# Patient Record
Sex: Female | Born: 1935
Health system: Southern US, Community
[De-identification: ages and names within clinical notes are randomized; demographics above are authoritative.]

## PROBLEM LIST (undated history)

## (undated) DIAGNOSIS — E785 Hyperlipidemia, unspecified: Secondary | ICD-10-CM

## (undated) DIAGNOSIS — K219 Gastro-esophageal reflux disease without esophagitis: Secondary | ICD-10-CM

## (undated) DIAGNOSIS — K2970 Gastritis, unspecified, without bleeding: Secondary | ICD-10-CM

## (undated) DIAGNOSIS — R7303 Prediabetes: Secondary | ICD-10-CM

## (undated) DIAGNOSIS — L570 Actinic keratosis: Secondary | ICD-10-CM

## (undated) DIAGNOSIS — M8580 Other specified disorders of bone density and structure, unspecified site: Secondary | ICD-10-CM

## (undated) DIAGNOSIS — K2210 Ulcer of esophagus without bleeding: Secondary | ICD-10-CM

## (undated) DIAGNOSIS — I10 Essential (primary) hypertension: Secondary | ICD-10-CM

## (undated) DIAGNOSIS — M1990 Unspecified osteoarthritis, unspecified site: Secondary | ICD-10-CM

## (undated) HISTORY — DX: Gastritis, unspecified, without bleeding: K29.70

## (undated) HISTORY — DX: Prediabetes: R73.03

## (undated) HISTORY — PX: OTHER SURGICAL HISTORY: SHX169

## (undated) HISTORY — DX: Actinic keratosis: L57.0

## (undated) HISTORY — DX: Hyperlipidemia, unspecified: E78.5

## (undated) HISTORY — DX: Unspecified osteoarthritis, unspecified site: M19.90

## (undated) HISTORY — DX: Essential (primary) hypertension: I10

## (undated) HISTORY — PX: APPENDECTOMY: SHX54

## (undated) HISTORY — DX: Gastro-esophageal reflux disease without esophagitis: K21.9

## (undated) HISTORY — DX: Ulcer of esophagus without bleeding: K22.10

## (undated) HISTORY — PX: EYE SURGERY: SHX253

## (undated) HISTORY — DX: Other specified disorders of bone density and structure, unspecified site: M85.80

---

## 2003-10-21 HISTORY — PX: OTHER SURGICAL HISTORY: SHX169

## 2004-06-27 ENCOUNTER — Emergency Department (HOSPITAL_COMMUNITY): Admission: EM | Admit: 2004-06-27 | Discharge: 2004-06-27 | Payer: Self-pay | Admitting: Family Medicine

## 2005-02-13 ENCOUNTER — Ambulatory Visit (HOSPITAL_COMMUNITY): Admission: RE | Admit: 2005-02-13 | Discharge: 2005-02-13 | Payer: Self-pay | Admitting: Family Medicine

## 2005-02-13 ENCOUNTER — Emergency Department (HOSPITAL_COMMUNITY): Admission: EM | Admit: 2005-02-13 | Discharge: 2005-02-13 | Payer: Self-pay | Admitting: Family Medicine

## 2005-09-30 ENCOUNTER — Ambulatory Visit: Payer: Self-pay | Admitting: Physical Medicine & Rehabilitation

## 2005-09-30 ENCOUNTER — Inpatient Hospital Stay (HOSPITAL_COMMUNITY): Admission: RE | Admit: 2005-09-30 | Discharge: 2005-10-03 | Payer: Self-pay | Admitting: Orthopedic Surgery

## 2005-09-30 HISTORY — PX: OTHER SURGICAL HISTORY: SHX169

## 2005-10-03 ENCOUNTER — Inpatient Hospital Stay
Admission: RE | Admit: 2005-10-03 | Discharge: 2005-10-10 | Payer: Self-pay | Admitting: Physical Medicine & Rehabilitation

## 2007-05-07 ENCOUNTER — Encounter: Admission: RE | Admit: 2007-05-07 | Discharge: 2007-05-07 | Payer: Self-pay | Admitting: General Practice

## 2007-07-21 HISTORY — PX: OTHER SURGICAL HISTORY: SHX169

## 2007-07-30 ENCOUNTER — Ambulatory Visit: Payer: Self-pay | Admitting: Family Medicine

## 2007-07-30 DIAGNOSIS — I10 Essential (primary) hypertension: Secondary | ICD-10-CM | POA: Insufficient documentation

## 2007-08-05 ENCOUNTER — Ambulatory Visit: Payer: Self-pay

## 2007-08-06 ENCOUNTER — Ambulatory Visit: Payer: Self-pay | Admitting: Family Medicine

## 2007-08-09 ENCOUNTER — Ambulatory Visit: Payer: Self-pay | Admitting: Family Medicine

## 2007-08-11 DIAGNOSIS — E785 Hyperlipidemia, unspecified: Secondary | ICD-10-CM | POA: Insufficient documentation

## 2007-08-11 LAB — CONVERTED CEMR LAB
ALT: 13 units/L (ref 0–35)
AST: 17 units/L (ref 0–37)
Albumin: 3.8 g/dL (ref 3.5–5.2)
Alkaline Phosphatase: 44 units/L (ref 39–117)
BUN: 23 mg/dL (ref 6–23)
Bilirubin, Direct: 0.1 mg/dL (ref 0.0–0.3)
CO2: 25 meq/L (ref 19–32)
Calcium: 9.5 mg/dL (ref 8.4–10.5)
Chloride: 106 meq/L (ref 96–112)
Cholesterol: 225 mg/dL (ref 0–200)
Creatinine, Ser: 1 mg/dL (ref 0.4–1.2)
Direct LDL: 150 mg/dL
GFR calc Af Amer: 69 mL/min
GFR calc non Af Amer: 57 mL/min
Glucose, Bld: 115 mg/dL — ABNORMAL HIGH (ref 70–99)
HDL: 39.1 mg/dL (ref >39.0)
Potassium: 4.5 meq/L (ref 3.5–5.1)
Sodium: 141 meq/L (ref 135–145)
TSH: 2.09 microintl units/mL (ref 0.35–5.50)
Total Bilirubin: 0.5 mg/dL (ref 0.3–1.2)
Total CHOL/HDL Ratio: 5.8
Total Protein: 7.1 g/dL (ref 6.0–8.3)
Triglycerides: 143 mg/dL (ref 0–149)
VLDL: 29 mg/dL (ref 0–40)

## 2007-08-12 ENCOUNTER — Encounter: Payer: Self-pay | Admitting: Family Medicine

## 2007-08-20 ENCOUNTER — Ambulatory Visit: Payer: Self-pay | Admitting: Family Medicine

## 2007-08-20 DIAGNOSIS — K219 Gastro-esophageal reflux disease without esophagitis: Secondary | ICD-10-CM | POA: Insufficient documentation

## 2007-08-20 DIAGNOSIS — M1990 Unspecified osteoarthritis, unspecified site: Secondary | ICD-10-CM | POA: Insufficient documentation

## 2007-09-03 ENCOUNTER — Ambulatory Visit: Payer: Self-pay | Admitting: Family Medicine

## 2007-09-07 ENCOUNTER — Ambulatory Visit: Payer: Self-pay | Admitting: Family Medicine

## 2007-09-08 ENCOUNTER — Encounter (INDEPENDENT_AMBULATORY_CARE_PROVIDER_SITE_OTHER): Payer: Self-pay | Admitting: *Deleted

## 2007-10-06 ENCOUNTER — Ambulatory Visit: Payer: Self-pay | Admitting: Family Medicine

## 2007-11-03 ENCOUNTER — Ambulatory Visit: Payer: Self-pay | Admitting: Internal Medicine

## 2007-11-06 ENCOUNTER — Encounter: Payer: Self-pay | Admitting: Internal Medicine

## 2007-11-10 ENCOUNTER — Ambulatory Visit: Payer: Self-pay | Admitting: Internal Medicine

## 2007-11-10 ENCOUNTER — Encounter: Payer: Self-pay | Admitting: Family Medicine

## 2007-11-10 ENCOUNTER — Encounter: Payer: Self-pay | Admitting: Internal Medicine

## 2007-11-12 ENCOUNTER — Ambulatory Visit: Payer: Self-pay | Admitting: Family Medicine

## 2007-11-12 DIAGNOSIS — K208 Other esophagitis without bleeding: Secondary | ICD-10-CM | POA: Insufficient documentation

## 2007-11-12 DIAGNOSIS — K2210 Ulcer of esophagus without bleeding: Secondary | ICD-10-CM | POA: Insufficient documentation

## 2007-11-12 DIAGNOSIS — K279 Peptic ulcer, site unspecified, unspecified as acute or chronic, without hemorrhage or perforation: Secondary | ICD-10-CM | POA: Insufficient documentation

## 2007-11-15 LAB — CONVERTED CEMR LAB
BUN: 20 mg/dL (ref 6–23)
CO2: 28 meq/L (ref 19–32)
Calcium: 9.7 mg/dL (ref 8.4–10.5)
Chloride: 107 meq/L (ref 96–112)
Cholesterol: 205 mg/dL (ref 0–200)
Creatinine, Ser: 1 mg/dL (ref 0.4–1.2)
Direct LDL: 125.4 mg/dL
GFR calc Af Amer: 69 mL/min
GFR calc non Af Amer: 57 mL/min
Glucose, Bld: 113 mg/dL — ABNORMAL HIGH (ref 70–99)
HDL: 37.1 mg/dL — ABNORMAL LOW (ref >39.0)
Potassium: 3.8 meq/L (ref 3.5–5.1)
Sodium: 142 meq/L (ref 135–145)
Total CHOL/HDL Ratio: 5.5
Triglycerides: 163 mg/dL — ABNORMAL HIGH (ref 0–149)
VLDL: 33 mg/dL (ref 0–40)

## 2007-12-28 ENCOUNTER — Telehealth (INDEPENDENT_AMBULATORY_CARE_PROVIDER_SITE_OTHER): Payer: Self-pay | Admitting: *Deleted

## 2008-01-05 ENCOUNTER — Ambulatory Visit: Payer: Self-pay | Admitting: Internal Medicine

## 2008-01-05 ENCOUNTER — Encounter: Payer: Self-pay | Admitting: Family Medicine

## 2008-03-24 ENCOUNTER — Ambulatory Visit: Payer: Self-pay | Admitting: Family Medicine

## 2008-03-27 ENCOUNTER — Encounter: Payer: Self-pay | Admitting: Family Medicine

## 2008-03-27 ENCOUNTER — Ambulatory Visit: Payer: Self-pay | Admitting: Family Medicine

## 2008-03-27 LAB — CONVERTED CEMR LAB
Basophils Absolute: 0 10*3/uL (ref 0.0–0.1)
Basophils Relative: 0.6 % (ref 0.0–1.0)
Eosinophils Absolute: 0.2 10*3/uL (ref 0.0–0.7)
Eosinophils Relative: 2.7 % (ref 0.0–5.0)
Folate: >20 ng/mL
HCT: 42 % (ref 36.0–46.0)
Hemoglobin: 14.6 g/dL (ref 12.0–15.0)
Lymphocytes Relative: 29.8 % (ref 12.0–46.0)
MCHC: 34.8 g/dL (ref 30.0–36.0)
MCV: 92.9 fL (ref 78.0–100.0)
Monocytes Absolute: 0.8 10*3/uL (ref 0.1–1.0)
Monocytes Relative: 9.1 % (ref 3.0–12.0)
Neutro Abs: 4.8 10*3/uL (ref 1.4–7.7)
Neutrophils Relative %: 57.8 % (ref 43.0–77.0)
Platelets: 227 10*3/uL (ref 150–400)
RBC: 4.52 M/uL (ref 3.87–5.11)
RDW: 12.7 % (ref 11.5–14.6)
Sed Rate: 26 mm/hr — ABNORMAL HIGH (ref 0–22)
TSH: 4.59 microintl units/mL (ref 0.35–5.50)
Vitamin B-12: 425 pg/mL (ref 211–911)
WBC: 8.3 10*3/uL (ref 4.5–10.5)

## 2008-05-10 ENCOUNTER — Ambulatory Visit: Payer: Self-pay | Admitting: Family Medicine

## 2008-05-25 ENCOUNTER — Encounter: Admission: RE | Admit: 2008-05-25 | Discharge: 2008-05-25 | Payer: Self-pay | Admitting: Family Medicine

## 2008-05-26 ENCOUNTER — Telehealth: Payer: Self-pay | Admitting: Family Medicine

## 2008-05-29 ENCOUNTER — Encounter (INDEPENDENT_AMBULATORY_CARE_PROVIDER_SITE_OTHER): Payer: Self-pay | Admitting: *Deleted

## 2008-08-09 ENCOUNTER — Ambulatory Visit: Payer: Self-pay | Admitting: Family Medicine

## 2008-09-08 ENCOUNTER — Telehealth: Payer: Self-pay | Admitting: Family Medicine

## 2008-09-11 ENCOUNTER — Telehealth: Payer: Self-pay | Admitting: Family Medicine

## 2008-11-01 ENCOUNTER — Ambulatory Visit: Payer: Self-pay | Admitting: Family Medicine

## 2008-11-01 LAB — CONVERTED CEMR LAB
ALT: 15 units/L (ref 0–35)
AST: 17 units/L (ref 0–37)
Albumin: 3.8 g/dL (ref 3.5–5.2)
Alkaline Phosphatase: 42 units/L (ref 39–117)
BUN: 20 mg/dL (ref 6–23)
Bilirubin, Direct: 0.1 mg/dL (ref 0.0–0.3)
CO2: 28 meq/L (ref 19–32)
Calcium: 9.9 mg/dL (ref 8.4–10.5)
Chloride: 104 meq/L (ref 96–112)
Cholesterol: 218 mg/dL (ref 0–200)
Creatinine, Ser: 1 mg/dL (ref 0.4–1.2)
Direct LDL: 132.9 mg/dL
GFR calc Af Amer: 68 mL/min
GFR calc non Af Amer: 57 mL/min
Glucose, Bld: 115 mg/dL — ABNORMAL HIGH (ref 70–99)
HDL: 36.1 mg/dL — ABNORMAL LOW (ref >39.0)
Potassium: 4.3 meq/L (ref 3.5–5.1)
Sodium: 141 meq/L (ref 135–145)
Total Bilirubin: 0.6 mg/dL (ref 0.3–1.2)
Total CHOL/HDL Ratio: 6
Total Protein: 7.1 g/dL (ref 6.0–8.3)
Triglycerides: 164 mg/dL — ABNORMAL HIGH (ref 0–149)
VLDL: 33 mg/dL (ref 0–40)

## 2008-11-06 ENCOUNTER — Ambulatory Visit: Payer: Self-pay | Admitting: Family Medicine

## 2008-11-06 DIAGNOSIS — R7303 Prediabetes: Secondary | ICD-10-CM | POA: Insufficient documentation

## 2008-11-06 LAB — CONVERTED CEMR LAB
Cholesterol, target level: 200 mg/dL
HDL goal, serum: 40 mg/dL
LDL Goal: 130 mg/dL

## 2008-11-10 ENCOUNTER — Encounter: Payer: Self-pay | Admitting: Family Medicine

## 2008-11-10 ENCOUNTER — Ambulatory Visit: Payer: Self-pay | Admitting: Family Medicine

## 2008-11-23 ENCOUNTER — Ambulatory Visit: Payer: Self-pay | Admitting: Family Medicine

## 2008-11-24 LAB — CONVERTED CEMR LAB
BUN: 34 mg/dL — ABNORMAL HIGH (ref 6–23)
CO2: 0 meq/L — CL (ref 19–32)
Calcium: 9.8 mg/dL (ref 8.4–10.5)
Chloride: 109 meq/L (ref 96–112)
Creatinine, Ser: 1 mg/dL (ref 0.4–1.2)
GFR calc Af Amer: 68 mL/min
GFR calc non Af Amer: 57 mL/min
Glucose, Bld: 104 mg/dL — ABNORMAL HIGH (ref 70–99)
Potassium: 5 meq/L (ref 3.5–5.1)
Sodium: 139 meq/L (ref 135–145)

## 2008-12-11 ENCOUNTER — Ambulatory Visit: Payer: Self-pay | Admitting: Family Medicine

## 2008-12-27 ENCOUNTER — Encounter: Payer: Self-pay | Admitting: Family Medicine

## 2008-12-29 ENCOUNTER — Telehealth: Payer: Self-pay | Admitting: Family Medicine

## 2009-01-17 ENCOUNTER — Telehealth: Payer: Self-pay | Admitting: Family Medicine

## 2009-01-17 ENCOUNTER — Ambulatory Visit: Payer: Self-pay | Admitting: Family Medicine

## 2009-01-17 LAB — CONVERTED CEMR LAB
BUN: 21 mg/dL (ref 6–23)
CO2: 29 meq/L (ref 19–32)
Calcium: 9.6 mg/dL (ref 8.4–10.5)
Chloride: 105 meq/L (ref 96–112)
Creatinine, Ser: 0.8 mg/dL (ref 0.4–1.2)
GFR calc non Af Amer: 73.11 mL/min (ref >60)
Glucose, Bld: 112 mg/dL — ABNORMAL HIGH (ref 70–99)
Potassium: 4.1 meq/L (ref 3.5–5.1)
Sodium: 140 meq/L (ref 135–145)

## 2009-01-25 ENCOUNTER — Ambulatory Visit (HOSPITAL_COMMUNITY): Admission: RE | Admit: 2009-01-25 | Discharge: 2009-01-25 | Payer: Self-pay | Admitting: Orthopedic Surgery

## 2009-03-12 ENCOUNTER — Ambulatory Visit: Payer: Self-pay | Admitting: Family Medicine

## 2009-04-24 ENCOUNTER — Telehealth: Payer: Self-pay | Admitting: Family Medicine

## 2009-05-08 ENCOUNTER — Telehealth: Payer: Self-pay | Admitting: Family Medicine

## 2009-05-20 HISTORY — PX: KNEE SURGERY: SHX244

## 2009-05-21 ENCOUNTER — Ambulatory Visit (HOSPITAL_COMMUNITY): Admission: RE | Admit: 2009-05-21 | Discharge: 2009-05-22 | Payer: Self-pay | Admitting: Orthopedic Surgery

## 2009-05-21 ENCOUNTER — Encounter (INDEPENDENT_AMBULATORY_CARE_PROVIDER_SITE_OTHER): Payer: Self-pay | Admitting: Orthopedic Surgery

## 2009-06-12 ENCOUNTER — Ambulatory Visit: Payer: Self-pay | Admitting: Family Medicine

## 2009-06-14 LAB — CONVERTED CEMR LAB
ALT: 13 units/L (ref 0–35)
AST: 15 units/L (ref 0–37)
Albumin: 3.7 g/dL (ref 3.5–5.2)
Alkaline Phosphatase: 38 units/L — ABNORMAL LOW (ref 39–117)
BUN: 26 mg/dL — ABNORMAL HIGH (ref 6–23)
Bilirubin, Direct: 0 mg/dL (ref 0.0–0.3)
CO2: 26 meq/L (ref 19–32)
Calcium: 9.2 mg/dL (ref 8.4–10.5)
Chloride: 113 meq/L — ABNORMAL HIGH (ref 96–112)
Cholesterol: 190 mg/dL (ref 0–200)
Creatinine, Ser: 1 mg/dL (ref 0.4–1.2)
GFR calc non Af Amer: 56.46 mL/min (ref >60)
Glucose, Bld: 103 mg/dL — ABNORMAL HIGH (ref 70–99)
HDL: 33.6 mg/dL — ABNORMAL LOW (ref >39.00)
LDL Cholesterol: 128 mg/dL — ABNORMAL HIGH (ref 0–99)
Potassium: 4.8 meq/L (ref 3.5–5.1)
Sodium: 143 meq/L (ref 135–145)
Total Bilirubin: 0.8 mg/dL (ref 0.3–1.2)
Total CHOL/HDL Ratio: 6
Total Protein: 6.6 g/dL (ref 6.0–8.3)
Triglycerides: 143 mg/dL (ref 0.0–149.0)
VLDL: 28.6 mg/dL (ref 0.0–40.0)

## 2009-06-15 ENCOUNTER — Ambulatory Visit: Payer: Self-pay | Admitting: Family Medicine

## 2009-06-19 ENCOUNTER — Encounter: Admission: RE | Admit: 2009-06-19 | Discharge: 2009-06-19 | Payer: Self-pay | Admitting: Family Medicine

## 2009-06-20 ENCOUNTER — Ambulatory Visit: Payer: Self-pay | Admitting: Internal Medicine

## 2009-06-21 DIAGNOSIS — R930 Abnormal findings on diagnostic imaging of skull and head, not elsewhere classified: Secondary | ICD-10-CM | POA: Insufficient documentation

## 2009-06-26 ENCOUNTER — Encounter: Admission: RE | Admit: 2009-06-26 | Discharge: 2009-06-26 | Payer: Self-pay | Admitting: Family Medicine

## 2009-06-28 DIAGNOSIS — M8580 Other specified disorders of bone density and structure, unspecified site: Secondary | ICD-10-CM | POA: Insufficient documentation

## 2009-09-18 ENCOUNTER — Ambulatory Visit: Payer: Self-pay | Admitting: Family Medicine

## 2009-10-17 ENCOUNTER — Encounter: Payer: Self-pay | Admitting: Family Medicine

## 2009-11-14 ENCOUNTER — Ambulatory Visit: Payer: Self-pay | Admitting: Family Medicine

## 2009-11-21 LAB — CONVERTED CEMR LAB
ALT: 14 units/L (ref 0–35)
AST: 17 units/L (ref 0–37)
Albumin: 3.6 g/dL (ref 3.5–5.2)
Alkaline Phosphatase: 42 units/L (ref 39–117)
BUN: 21 mg/dL (ref 6–23)
Bilirubin, Direct: 0 mg/dL (ref 0.0–0.3)
CO2: 28 meq/L (ref 19–32)
Calcium: 9.7 mg/dL (ref 8.4–10.5)
Chloride: 106 meq/L (ref 96–112)
Cholesterol: 200 mg/dL (ref 0–200)
Creatinine, Ser: 1 mg/dL (ref 0.4–1.2)
Direct LDL: 123.8 mg/dL
GFR calc non Af Amer: 56.4 mL/min (ref >60)
Glucose, Bld: 105 mg/dL — ABNORMAL HIGH (ref 70–99)
HDL: 39.2 mg/dL (ref >39.00)
Potassium: 4.2 meq/L (ref 3.5–5.1)
Sodium: 141 meq/L (ref 135–145)
Total Bilirubin: 0.6 mg/dL (ref 0.3–1.2)
Total CHOL/HDL Ratio: 5
Total Protein: 6.7 g/dL (ref 6.0–8.3)
Triglycerides: 213 mg/dL — ABNORMAL HIGH (ref 0.0–149.0)
VLDL: 42.6 mg/dL — ABNORMAL HIGH (ref 0.0–40.0)

## 2009-11-23 ENCOUNTER — Ambulatory Visit: Payer: Self-pay | Admitting: Family Medicine

## 2009-12-04 ENCOUNTER — Encounter: Payer: Self-pay | Admitting: Family Medicine

## 2009-12-12 ENCOUNTER — Encounter (INDEPENDENT_AMBULATORY_CARE_PROVIDER_SITE_OTHER): Payer: Self-pay | Admitting: *Deleted

## 2009-12-12 ENCOUNTER — Ambulatory Visit: Payer: Self-pay | Admitting: Internal Medicine

## 2009-12-18 ENCOUNTER — Ambulatory Visit: Payer: Self-pay | Admitting: Cardiovascular Disease

## 2009-12-26 ENCOUNTER — Ambulatory Visit: Payer: Self-pay | Admitting: Internal Medicine

## 2010-01-02 ENCOUNTER — Telehealth (INDEPENDENT_AMBULATORY_CARE_PROVIDER_SITE_OTHER): Payer: Self-pay | Admitting: *Deleted

## 2010-02-05 ENCOUNTER — Ambulatory Visit: Payer: Self-pay | Admitting: Internal Medicine

## 2010-02-05 DIAGNOSIS — K2970 Gastritis, unspecified, without bleeding: Secondary | ICD-10-CM | POA: Insufficient documentation

## 2010-02-05 DIAGNOSIS — K2990 Gastroduodenitis, unspecified, without bleeding: Secondary | ICD-10-CM

## 2010-03-04 ENCOUNTER — Ambulatory Visit (HOSPITAL_COMMUNITY): Admission: RE | Admit: 2010-03-04 | Discharge: 2010-03-04 | Payer: Self-pay | Admitting: Orthopedic Surgery

## 2010-05-27 ENCOUNTER — Ambulatory Visit: Payer: Self-pay | Admitting: Family Medicine

## 2010-05-31 LAB — CONVERTED CEMR LAB
ALT: 12 units/L (ref 0–35)
AST: 15 units/L (ref 0–37)
Albumin: 3.7 g/dL (ref 3.5–5.2)
Alkaline Phosphatase: 39 units/L (ref 39–117)
BUN: 25 mg/dL — ABNORMAL HIGH (ref 6–23)
Bilirubin, Direct: 0.1 mg/dL (ref 0.0–0.3)
CO2: 28 meq/L (ref 19–32)
Calcium: 9.2 mg/dL (ref 8.4–10.5)
Chloride: 109 meq/L (ref 96–112)
Cholesterol: 215 mg/dL — ABNORMAL HIGH (ref 0–200)
Creatinine, Ser: 1.1 mg/dL (ref 0.4–1.2)
Direct LDL: 131.7 mg/dL
GFR calc non Af Amer: 50.99 mL/min (ref >60)
Glucose, Bld: 108 mg/dL — ABNORMAL HIGH (ref 70–99)
HDL: 35.4 mg/dL — ABNORMAL LOW (ref >39.00)
Potassium: 4.3 meq/L (ref 3.5–5.1)
Sodium: 144 meq/L (ref 135–145)
Total Bilirubin: 0.3 mg/dL (ref 0.3–1.2)
Total CHOL/HDL Ratio: 6
Total Protein: 6.3 g/dL (ref 6.0–8.3)
Triglycerides: 202 mg/dL — ABNORMAL HIGH (ref 0.0–149.0)
VLDL: 40.4 mg/dL — ABNORMAL HIGH (ref 0.0–40.0)

## 2010-06-03 ENCOUNTER — Ambulatory Visit: Payer: Self-pay | Admitting: Family Medicine

## 2010-07-03 ENCOUNTER — Encounter: Admission: RE | Admit: 2010-07-03 | Discharge: 2010-07-03 | Payer: Self-pay | Admitting: Family Medicine

## 2010-07-03 LAB — HM MAMMOGRAPHY: HM Mammogram: NEGATIVE

## 2010-11-11 ENCOUNTER — Encounter: Payer: Self-pay | Admitting: Family Medicine

## 2010-11-18 ENCOUNTER — Other Ambulatory Visit: Payer: Self-pay | Admitting: Family Medicine

## 2010-11-18 ENCOUNTER — Ambulatory Visit
Admission: RE | Admit: 2010-11-18 | Discharge: 2010-11-18 | Payer: Self-pay | Source: Home / Self Care | Attending: Family Medicine | Admitting: Family Medicine

## 2010-11-18 LAB — BASIC METABOLIC PANEL
BUN: 33 mg/dL — ABNORMAL HIGH (ref 6–23)
CO2: 27 mEq/L (ref 19–32)
Calcium: 9.2 mg/dL (ref 8.4–10.5)
Chloride: 105 mEq/L (ref 96–112)
Creatinine, Ser: 1 mg/dL (ref 0.4–1.2)
GFR: 56.92 mL/min — ABNORMAL LOW (ref >60.00)
Glucose, Bld: 117 mg/dL — ABNORMAL HIGH (ref 70–99)
Potassium: 4.5 mEq/L (ref 3.5–5.1)
Sodium: 139 mEq/L (ref 135–145)

## 2010-11-18 LAB — LIPID PANEL
Cholesterol: 205 mg/dL — ABNORMAL HIGH (ref 0–200)
HDL: 35.6 mg/dL — ABNORMAL LOW (ref >39.00)
Total CHOL/HDL Ratio: 6
Triglycerides: 208 mg/dL — ABNORMAL HIGH (ref 0.0–149.0)
VLDL: 41.6 mg/dL — ABNORMAL HIGH (ref 0.0–40.0)

## 2010-11-18 LAB — HEPATIC FUNCTION PANEL
ALT: 14 U/L (ref 0–35)
AST: 18 U/L (ref 0–37)
Albumin: 3.7 g/dL (ref 3.5–5.2)
Alkaline Phosphatase: 44 U/L (ref 39–117)
Bilirubin, Direct: 0.1 mg/dL (ref 0.0–0.3)
Total Bilirubin: 0.4 mg/dL (ref 0.3–1.2)
Total Protein: 6.6 g/dL (ref 6.0–8.3)

## 2010-11-18 LAB — LDL CHOLESTEROL, DIRECT: Direct LDL: 130.4 mg/dL

## 2010-11-20 NOTE — Assessment & Plan Note (Signed)
Summary: 6 months follow up/lsf   Vital Signs:  Patient Profile:   77 Years Old Female Height:     63 inches Weight:      166 pounds Temp:     97.9 degrees F oral Pulse rate:   72 / minute Pulse rhythm:   regular BP sitting:   152 / 78  (left arm) Cuff size:   regular  Vitals Entered By: Delilah Shan (May 10, 2008 10:55 AM)                 Referred by:  Ermalene Searing PCP:  Ermalene Searing  Chief Complaint:  6 months follow up.  History of Present Illness: stiffness in joints especially knees, shoulders and fingers x years some days worse than other no redness, swelling achyness in AMs that improves as she moves      Hypertension History:      occ check BP at drug store, well controlled.        Positive major cardiovascular risk factors include female age 17 years old or older, hyperlipidemia, and hypertension.  Negative major cardiovascular risk factors include no history of diabetes, negative family history for ischemic heart disease, and non-tobacco-user status.        Further assessment for target organ damage reveals no history of ASHD, cardiac end-organ damage (CHF/LVH), stroke/TIA, peripheral vascular disease, renal insufficiency, or hypertensive retinopathy.       Current Allergies (reviewed today): No known allergies   Past Medical History:    Reviewed history from 12/23/2007 and no changes required:       OSTEOARTHRITIS (ICD-715.90)       GERD (ICD-530.81)       HYPERLIPIDEMIA (ICD-272.4)       SYMPTOM, MALAISE AND FATIGUE NEC (ICD-780.79)       HYPERTENSION, BENIGN (ICD-401.1)       CHEST PAIN, ATYPICAL (ICD-786.59)       GASTRIC ULCER   Social History:    Reviewed history from 08/06/2007 and no changes required:       Retired [employer], Arboriculturist, [employer]       Never Smoked       widowed       Alcohol use-no       Drug use-no       Regular exercise-yes, walks       Diet: fruit and veggies    Review of Systems       recently  improving viral URI  General      Denies fatigue, fever, loss of appetite, sweats, and weakness.  CV      Denies chest pain or discomfort.  Resp      Denies shortness of breath.  GI      Denies abdominal pain.  GU      Denies dysuria.  Derm      Denies rash.  Neuro      Denies falling down, headaches, numbness, seizures, and tingling.  Psych      Denies anxiety and depression.  Endo      Denies cold intolerance, excessive hunger, excessive thirst, excessive urination, and heat intolerance.   Physical Exam  General:     Elderly female overweight in NAD Neck:     no carotid bruit or thyromegaly  Lungs:     Normal respiratory effort, chest expands symmetrically. Lungs are clear to auscultation, no crackles or wheezes. Heart:     Normal rate and regular rhythm. S1 and S2 normal without gallop, murmur, click, rub  or other extra sounds. Msk:     deformity at MCP joints and PIP, DIP joints B hands, scar on left knee no erythema or swelling of jpints joints nontender to palpation decrease range of motion in hands, knees and B hips Pulses:     R and L posterior tibial pulses are full and equal bilaterally  Extremities:     no edema Neurologic:     No cranial nerve deficits noted. Station and gait are normal.DTRs are symmetrical throughout. Sensory, motor and coordinative functions appear intact. Skin:     Intact without suspicious lesions or rashes    Impression & Recommendations:  Problem # 1:  GENERALIZED OSTEOARTHROSIS UNSPECIFIED SITE (ICD-715.00) encouraged exercise, stretching. MAy use meloxicam as needed pain, use as sparingly as possible. Discussed possible SE.  Stop if any stomache irritation, will use low dose.  May use glucosamine as well. Avoid other NSAIDs.  Her updated medication list for this problem includes:    Aleve 220 Mg Tabs (Naproxen sodium) .Marland Kitchen... Daily as needed    Tylenol 325 Mg Tabs (Acetaminophen) .Marland Kitchen... As needed    Meloxicam 7.5 Mg Tabs  (Meloxicam) .Marland Kitchen... Take 1 tablet by mouth once a day  as needed   Problem # 2:  HYPERTENSION, BENIGN (ICD-401.1) Unclear control. Follow at home, likely will need medication increase. Encouraged exercise, weight loss, healthy eating habits.  Her updated medication list for this problem includes:    Hydrochlorothiazide 25 Mg Tabs (Hydrochlorothiazide) .Marland Kitchen... Take 1 tablet by mouth once a day    Metoprolol Tartrate 50 Mg Tabs (Metoprolol tartrate) .Marland Kitchen... Take 1 tablet by mouth two times a day  BP today: 152/78 Prior BP: 170/80 (03/27/2008)  Prior 10 Yr Risk Heart Disease: Not enough information (08/06/2007)  Labs Reviewed: Creat: 1.0 (11/12/2007) Chol: 205 (11/12/2007)   HDL: 37.1 (11/12/2007)   LDL: DEL (11/12/2007)   TG: 163 (11/12/2007)   Complete Medication List: 1)  Multivitamins Tabs (Multiple vitamin) .... Once daily 2)  Caltrate 600+d 600-400 Mg-unit Tabs (Calcium carbonate-vitamin d) .Marland Kitchen.. 1 by mouth two times a day 3)  Aleve 220 Mg Tabs (Naproxen sodium) .... Daily as needed 4)  Tylenol 325 Mg Tabs (Acetaminophen) .... As needed 5)  Hydrochlorothiazide 25 Mg Tabs (Hydrochlorothiazide) .... Take 1 tablet by mouth once a day 6)  Metoprolol Tartrate 50 Mg Tabs (Metoprolol tartrate) .... Take 1 tablet by mouth two times a day 7)  Meloxicam 7.5 Mg Tabs (Meloxicam) .... Take 1 tablet by mouth once a day  as needed  Hypertension Assessment/Plan:      The patient's hypertensive risk group is category B: At least one risk factor (excluding diabetes) with no target organ damage.  Today's blood pressure is 152/78.  Her blood pressure goal is < 140/90.   Patient Instructions: 1)  Measure BP  every few days. Write down BP at home. Call in 1-2 weeks with measurements. 2)  Glucosamine 500 mg 1-3 times a day, get over the counter 3)  Please schedule a follow-up appointment in 2-3 months BP check.   Prescriptions: MELOXICAM 7.5 MG  TABS (MELOXICAM) Take 1 tablet by mouth once a day  as  needed  #30 x 3   Entered and Authorized by:   Kerby Nora MD   Signed by:   Kerby Nora MD on 05/10/2008   Method used:   [facility] to ...       [phone][facility]       [address]  [place], [place]  [address]       Ph: [phone]       Fax: [phone]   RxID:   [id number]  ] Current Allergies (reviewed today): No known allergies  Current Medications (including changes made in today's visit):  MULTIVITAMINS   TABS (MULTIPLE VITAMIN) once daily CALTRATE 600+D 600-400 MG-UNIT  TABS (CALCIUM CARBONATE-VITAMIN D) 1 by mouth two times a day ALEVE 220 MG  TABS (NAPROXEN SODIUM) Daily as needed TYLENOL 325 MG  TABS (ACETAMINOPHEN) as needed HYDROCHLOROTHIAZIDE 25 MG  TABS (HYDROCHLOROTHIAZIDE) Take 1 tablet by mouth once a day METOPROLOL TARTRATE 50 MG  TABS (METOPROLOL TARTRATE) Take 1 tablet by mouth two times a day MELOXICAM 7.5 MG  TABS (MELOXICAM) Take 1 tablet by mouth once a day  as needed Patient did not bring medications or an updated medication list.  The above list is what is currently on the chart. Patient states that all medications are the same. [name]  May 10, 2008 11:02 AM

## 2010-11-20 NOTE — Assessment & Plan Note (Signed)
Summary: 6 MONTH FOLLOW UP/[facility]H   Vital Signs:  Patient profile:   77 year old female Height:      63 inches Weight:      159.6 pounds BMI:     28.37 Temp:     97.7 degrees F oral Pulse rate:   72 / minute Pulse rhythm:   regular BP sitting:   130 / 80  (left arm) Cuff size:   regular  Vitals Entered By: Benny Lennert CMA Duncan Dull) (June 03, 2010 11:23 AM)  History of Present Illness: Chief complaint 6 month follow up  Chest pain, epigastric..recent EGD by Dr. Leone Payor showed moderate gastritis in antrum. Negative Bx for Hpylori.  She has had improvement of symptoms with hyoscamine..was too expensive..now on dicyclomine.  Helps with chest pain feeling she has been having.Marland Kitchenonly uses it as needed.  Uses fish oil as needed..not regularly.  Has lost 5 lbs since last OV.  Has upcoming cataract removal procedure.   Hypertension History:      She denies headache, chest pain, palpitations, dyspnea with exertion, peripheral edema, and side effects from treatment.  Bp at home..usually well controlled.        Positive major cardiovascular risk factors include female age 60 years old or older, hyperlipidemia, and hypertension.  Negative major cardiovascular risk factors include no history of diabetes, negative family history for ischemic heart disease, and non-tobacco-user status.        Further assessment for target organ damage reveals no history of ASHD, cardiac end-organ damage (CHF/LVH), stroke/TIA, peripheral vascular disease, renal insufficiency, or hypertensive retinopathy.     Allergies: No Known Drug Allergies  Past History:  Past medical, surgical, family and social histories (including risk factors) reviewed, and no changes noted (except as noted below).  Past Medical History: Reviewed history from 12/23/2007 and no changes required. OSTEOARTHRITIS (ICD-715.90) GERD (ICD-530.81) HYPERLIPIDEMIA (ICD-272.4) SYMPTOM, MALAISE AND FATIGUE NEC (ICD-780.79) HYPERTENSION,  BENIGN (ICD-401.1) CHEST PAIN, ATYPICAL (ICD-786.59) GASTRIC ULCER  Past Surgical History: Reviewed history from 06/15/2009 and no changes required. left knee replacement Charlann Boxer) Appendectomy shoulder dislocation right shoulder (Supple) 10/08 nuclear Cardiolyte neg 05/2009 knee surgery to cut nerves  Family History: Reviewed history from 08/06/2007 and no changes required. father died age 66 CAD mother died age 25 old age 93 sisters: HTN no cancer MI < age 932  Social History: Reviewed history from 12/12/2009 and no changes required. 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 CV:  Denies fainting and swelling of feet. Resp:  Denies shortness of breath. GI:  Denies bloody stools, constipation, and diarrhea. GU:  Denies dysuria. Neuro:  Denies headaches, numbness, and weakness; No falls.  Physical Exam  General:  elderly overweight female in NAD  Mouth:  Oral mucosa and oropharynx without lesions or exudates.  Teeth in good repair. Neck:  no carotid bruit or thyromegaly no cervical or supraclavicular lymphadenopathy  Lungs:  Normal respiratory effort, chest expands symmetrically. Lungs are clear to auscultation, no crackles or wheezes. Heart:  Normal rate and regular rhythm. S1 and S2 normal without gallop, murmur, click, rub or other extra sounds. Abdomen:  Bowel sounds positive,abdomen soft and non-tender without masses, organomegaly or hernias noted. Pulses:  R and L posterior tibial pulses are full and equal bilaterally  Extremities:  no edema  Skin:  Intact without suspicious lesions or rashes  Impression & Recommendations:  Problem # 1:  ABDOMINAL PAIN-EPIGASTRIC (ICD-789.06)  Significant improvement in her longterm symptoms with GIs current use of  antispasmodics.   Problem # 2:  PREDIABETES (ICD-790.29) Encouraged exercise, weight loss, healthy eating habits.  Labs Reviewed: Creat: 1.1 (05/27/2010)     Problem # 3:  HYPERLIPIDEMIA (ICD-272.4) Fish oil 2000 mg divided daily. Please schedule a follow-up appointment in 6 months in [place] with fasting lipids, CMET Dx 272.0   Decrease fatty foods, fried foods, animal fats,carbohydrates . Increase walking, use onbly vegetable fats. Labs Reviewed: SGOT: 15 (05/27/2010)   SGPT: 12 (05/27/2010)  Lipid Goals: Chol Goal: 200 (11/06/2008)   HDL Goal: 40 (11/06/2008)   LDL Goal: 130 (11/06/2008)   TG Goal: 150 (11/06/2008)  10 Yr Risk Heart Disease: 13 % Prior 10 Yr Risk Heart Disease: 8 % (11/23/2009)   HDL:35.40 (05/27/2010), 39.20 (11/14/2009)  LDL:128 (06/12/2009), DEL (11/01/2008)  Chol:215 (05/27/2010), 200 (11/14/2009)  Trig:202.0 (05/27/2010), 213.0 (11/14/2009)  Problem # 4:  HYPERTENSION, BENIGN (ICD-401.1) Well controlled. Continue current medication.  Her updated medication list for this problem includes:    Lisinopril-hydrochlorothiazide 20-25 Mg Tabs (Lisinopril-hydrochlorothiazide) .Marland Kitchen... 1 tab by mouth daily    Metoprolol Tartrate 50 Mg Tabs (Metoprolol tartrate) .Marland Kitchen... Take 1 tab two times a day    Metoprolol Tartrate 25 Mg Tabs (Metoprolol tartrate) .Marland Kitchen... 1 tab by mouth two times a day along with 50 mg tablet  BP today: 130/80 Prior BP: 120/68 (02/05/2010)  10 Yr Risk Heart Disease: 13 % Prior 10 Yr Risk Heart Disease: 8 % (11/23/2009)  Labs Reviewed: K+: 4.3 (05/27/2010) Creat: : 1.1 (05/27/2010)   Chol: 215 (05/27/2010)   HDL: 35.40 (05/27/2010)   LDL: 128 (06/12/2009)   TG: 202.0 (05/27/2010)  Complete Medication List: 1)  Multivitamins Tabs (Multiple vitamin) .... Once daily 2)  Caltrate 600+d 600-400 Mg-unit Tabs (Calcium carbonate-vitamin d) .Marland Kitchen.. 1 by mouth two times a day 3)  Tylenol 325 Mg Tabs (Acetaminophen) .... As needed 4)  Lisinopril-hydrochlorothiazide  20-25 Mg Tabs (Lisinopril-hydrochlorothiazide) .Marland Kitchen.. 1 tab by mouth daily 5)  Metoprolol Tartrate 50 Mg Tabs (Metoprolol tartrate) .... Take 1 tab two times a day 6)  Metoprolol Tartrate 25 Mg Tabs (Metoprolol tartrate) .Marland Kitchen.. 1 tab by mouth two times a day along with 50 mg tablet 7)  Aspirin 81 Mg Tabs (Aspirin) .... Take 1 tablet by mouth once a day 8)  Glucosamine Sulfate 500 Mg Caps (Glucosamine sulfate) 9)  Prilosec 40 Mg Cpdr (Omeprazole) .... Take one tablet by mouth daily 10)  Nystatin 100000 Unit/gm Crea (Nystatin) .... Apply to affected area two times a day x 2 week, continue 48 hours after symptoms resolved. 11)  Tramadol Hcl 50 Mg Tabs (Tramadol hcl) .... Take as needed pain 12)  Dicyclomine Hcl 10 Mg Caps (Dicyclomine hcl) .Marland Kitchen.. 1-2 every 6 hrs as needed for abdominal pain  Hypertension Assessment/Plan:      The patient's hypertensive risk group is category B: At least one risk factor (excluding diabetes) with no target organ damage.  Her calculated 10 year risk of coronary heart disease is 13 %.  Today's blood pressure is 130/80.  Her blood pressure goal is < 140/90.  Patient Instructions: 1)  Fish oil 2000 mg divided daily. 2)  Please schedule a follow-up appointment in 6 months in CPX with fasting lipids, CMET Dx 272.0  3)   Decrease fatty foods, fried foods, animal fats,carbohydrates . Increase walking, use onbly vegetable fats.  Current Allergies: No known allergies

## 2010-11-20 NOTE — Assessment & Plan Note (Signed)
Summary: NOT FEELING WELL/RBH   Vital Signs:  Patient Profile:   77 Years Old Female Height:     63 inches Weight:      164.50 pounds BMI:     29.25 Temp:     97.5 degrees F oral Pulse rate:   72 / minute Pulse rhythm:   regular BP sitting:   114 / 80  (left arm) Cuff size:   regular  Vitals Entered By: Delilah Shan (March 24, 2008 12:35 PM)                 Referred by:  Ermalene Searing PCP:  Ermalene Searing  Chief Complaint:  Not feeling well and no energy.    Marland Kitchen  History of Present Illness: Feels fatigued in last months no palpitations no blood in stool, no hematuria, no vaginal bleeding no new SOB no depression no heat or cold intolerance   Recently found ulcer and GERD on EGD on omeprazole 40 mg daily symptoms of chest pain better  still having puffiness in epigastrum and feels funny there     Current Allergies (reviewed today): No known allergies   Past Medical History:    Reviewed history from 12/23/2007 and no changes required:       OSTEOARTHRITIS (ICD-715.90)       GERD (ICD-530.81)       HYPERLIPIDEMIA (ICD-272.4)       SYMPTOM, MALAISE AND FATIGUE NEC (ICD-780.79)       HYPERTENSION, BENIGN (ICD-401.1)       CHEST PAIN, ATYPICAL (ICD-786.59)       GASTRIC ULCER   Social History:    Reviewed history from 08/06/2007 and no changes required:       Retired [employer], Arboriculturist, [employer]       Never Smoked       widowed       Alcohol use-no       Drug use-no       Regular exercise-yes, walks       Diet: fruit and veggies    Review of Systems       no unxepected weight loss, good by mouth,   General      Complains of fatigue.      Denies fever, loss of appetite, sweats, weakness, and weight loss.  Eyes      Denies eye irritation.  ENT      Denies decreased hearing.  CV      Denies chest pain or discomfort.  Resp      Denies shortness of breath.  GI      Denies abdominal pain, bloody stools, constipation, and diarrhea.  GU       Denies dysuria.  MS      Complains of stiffness.      Denies joint pain, joint redness, joint swelling, and muscle weakness.  Derm      Complains of lesion(s).      concerned about area on left posterior arm  Neuro      Denies disturbances in coordination, falling down, headaches, numbness, seizures, and weakness.  Psych      Denies anxiety and depression.  Endo      Denies cold intolerance, excessive thirst, excessive urination, and heat intolerance.  Heme      Denies abnormal bruising.   Physical Exam  General:     Elderly female overweight in NAD Eyes:     No corneal or conjunctival inflammation noted. EOMI. Perrla.  Vision grossly normal. Ears:  External ear exam shows no significant lesions or deformities.  Otoscopic examination reveals clear canals, tympanic membranes are intact bilaterally without bulging, retraction, inflammation or discharge. Hearing is grossly normal bilaterally. Nose:     External nasal examination shows no deformity or inflammation. Nasal mucosa are pink and moist without lesions or exudates. Mouth:     Oral mucosa and oropharynx without lesions or exudates.  Teeth in good repair. Chest Wall:     she has an area of fatty tissue between her breast, no specific mass, no erythema, nontender to palpation Lungs:     Normal respiratory effort, chest expands symmetrically. Lungs are clear to auscultation, no crackles or wheezes. Heart:     Normal rate and regular rhythm. S1 and S2 normal without gallop, murmur, click, rub or other extra sounds. Abdomen:     Bowel sounds positive,abdomen soft and non-tender without masses, organomegaly or hernias noted. Pulses:     R and L posterior tibial pulses are full and equal bilaterally  Extremities:     No clubbing, cyanosis, edema, or deformity noted with normal full range of motion of all joints.   Skin:     RAised keratinpous white nodule left upper arm, ? basal/squamous cell or cutaneous horn  Cervical Nodes:     no cervical or supraclavicular lymphadenopathy     Impression & Recommendations:  Problem # 1:  SYMPTOM, MALAISE AND FATIGUE NEC (ICD-780.79) WIll eval for etiologies. Reviwed: recent cardiolyte was low risk. Eval for possible anemia given PUD on recent EGD. No other specific symptoms.  Orders: TLB-CBC Platelet - w/Differential (85025-CBCD) TLB-B12 + Folate Pnl (16109_[id number]/JXB) TLB-TSH (Thyroid Stimulating Hormone) (84443-TSH) TLB-Sedimentation Rate (ESR) (85651-ESR)   Complete Medication List: 1)  Multivitamins Tabs (Multiple vitamin) .... Once daily 2)  Caltrate 600+d 600-400 Mg-unit Tabs (Calcium carbonate-vitamin d) .Marland Kitchen.. 1 by mouth two times a day 3)  Aleve 220 Mg Tabs (Naproxen sodium) .... Daily as needed 4)  Tylenol 325 Mg Tabs (Acetaminophen) .... As needed 5)  Hydrochlorothiazide 25 Mg Tabs (Hydrochlorothiazide) .... Take 1 tablet by mouth once a day 6)  Metoprolol Tartrate 50 Mg Tabs (Metoprolol tartrate) .... Take 1 tablet by mouth two times a day   Patient Instructions: 1)  Schedule shave biopsy on left arm.   ] Current Allergies (reviewed today): No known allergies  Current Medications (including changes made in today's visit):  MULTIVITAMINS   TABS (MULTIPLE VITAMIN) once daily CALTRATE 600+D 600-400 MG-UNIT  TABS (CALCIUM CARBONATE-VITAMIN D) 1 by mouth two times a day ALEVE 220 MG  TABS (NAPROXEN SODIUM) Daily as needed TYLENOL 325 MG  TABS (ACETAMINOPHEN) as needed HYDROCHLOROTHIAZIDE 25 MG  TABS (HYDROCHLOROTHIAZIDE) Take 1 tablet by mouth once a day METOPROLOL TARTRATE 50 MG  TABS (METOPROLOL TARTRATE) Take 1 tablet by mouth two times a day

## 2010-11-20 NOTE — Progress Notes (Signed)
Summary: Meloxicam  Phone Note Refill Request Message from:  [employer] on September 08, 2008 10:29 AM  Refills Requested: Medication #1:  MELOXICAM 7.5 MG  TABS Take 1 tablet by mouth once a day  as needed   Dosage confirmed as above?Dosage Confirmed  Method Requested: Send response back to me. Initial call taken by: Delilah Shan,  September 08, 2008 10:29 AM      Prescriptions: MELOXICAM 7.5 MG  TABS (MELOXICAM) Take 1 tablet by mouth once a day  as needed  #30 x 3   Entered and Authorized by:   Kerby Nora MD   Signed by:   Kerby Nora MD on 09/08/2008   Method used:   [facility]        [facility]  [address] [facility]retail[facility]       [address]       [place], [place]  [address]       Ph: [phone] or [phone]       Fax: [phone]   RxID:   [id number]

## 2010-11-20 NOTE — Assessment & Plan Note (Signed)
Summary: CPX/RBH   Vital Signs:  Patient Profile:   77 Years Old Female Height:     63 inches Weight:      157.38 pounds BMI:     27.98 Temp:     98.2 degrees F oral Pulse rate:   60 / minute Pulse rhythm:   regular BP sitting:   146 / 70  (left arm) Cuff size:   regular  Vitals Entered By: Delilah Shan (November 06, 2008 11:24 AM)                 Referred by:  Ermalene Searing PCP:  Ermalene Searing  Chief Complaint:  CPX.  History of Present Illness: minimal exercise over winter. Has lost ten pounds eating less.  Hypertension History:      BP at pharmacy usually 140-150 systolic.        Positive major cardiovascular risk factors include female age 10 years old or older, hyperlipidemia, and hypertension.  Negative major cardiovascular risk factors include no history of diabetes, negative family history for ischemic heart disease, and non-tobacco-user status.        Further assessment for target organ damage reveals no history of ASHD, cardiac end-organ damage (CHF/LVH), stroke/TIA, peripheral vascular disease, renal insufficiency, or hypertensive retinopathy.    Lipid Management History:      Positive NCEP/ATP III risk factors include female age 75 years old or older, HDL cholesterol less than 40, and hypertension.  Negative NCEP/ATP III risk factors include non-diabetic, no family history for ischemic heart disease, non-tobacco-user status, no ASHD (atherosclerotic heart disease), no prior stroke/TIA, and no peripheral vascular disease.        The patient states that she knows about the "Therapeutic Lifestyle Change" diet.  Her compliance with the TLC diet is good.  The patient expresses understanding of adjunctive measures for cholesterol lowering.  Adjunctive measures started by the patient include aerobic exercise, fiber, limit alcohol consumpton, and weight reduction.        Current Allergies (reviewed today): No known allergies   Past Medical History:    Reviewed history from  12/23/2007 and no changes required:       OSTEOARTHRITIS (ICD-715.90)       GERD (ICD-530.81)       HYPERLIPIDEMIA (ICD-272.4)       SYMPTOM, MALAISE AND FATIGUE NEC (ICD-780.79)       HYPERTENSION, BENIGN (ICD-401.1)       CHEST PAIN, ATYPICAL (ICD-786.59)       GASTRIC ULCER   Social History:    Reviewed history from 08/06/2007 and no changes required:       Retired [employer], Arboriculturist, [employer]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  CV      Complains of chest pain or discomfort.      chronic chest discomfort as noted in past. Has had full eval.   Resp      Denies shortness of breath.  GI      Denies abdominal pain, bloody stools, constipation, and diarrhea.  GU      Denies abnormal vaginal bleeding, dysuria, hematuria, urinary frequency, and urinary hesitancy.   Physical Exam  General:  Elderly female overweight in NAD Ears:     External ear exam shows no significant lesions or deformities.  Otoscopic examination reveals clear canals, tympanic membranes are intact bilaterally without bulging, retraction, inflammation or discharge. Hearing is grossly normal bilaterally. Nose:     External nasal examination shows no deformity or inflammation. Nasal mucosa are pink and moist without lesions or exudates. Mouth:     Oral mucosa and oropharynx without lesions or exudates.  Teeth in good repair. Neck:     no carotid bruit or thyromegaly no cervical or supraclavicular lymphadenopathy  Chest Wall:     No deformities, masses, or tenderness noted. Breasts:     No mass, nodules, thickening, tenderness, bulging, retraction, inflamation, nipple discharge or skin changes noted.   Lungs:     Normal respiratory effort, chest expands symmetrically. Lungs are clear to auscultation, no crackles or  wheezes. Heart:     Normal rate and regular rhythm. S1 and S2 normal without gallop, murmur, click, rub or other extra sounds. Abdomen:     Bowel sounds positive,abdomen soft and non-tender without masses, organomegaly or hernias noted. Genitalia:     not indicated Pulses:     R and L posterior tibial pulses are full and equal bilaterally  Extremities:     no edema Skin:     left forearm erythematous raise lesion concerning for skin cancer,basal cell  Erythematous skin between breasts, dry flaky in area pt rubs where she has chronic discomfort.  Psych:     Cognition and judgment appear intact. Alert and cooperative with normal attention span and concentration. No apparent delusions, illusions, hallucinations    Impression & Recommendations:  Problem # 1:  HYPERTENSION, BENIGN (ICD-401.1) Not ideally controlled. Increase BP med. Add lisinopril to HCTZ.  Her updated medication list for this problem includes:    Lisinopril-hydrochlorothiazide 10-12.5 Mg Tabs (Lisinopril-hydrochlorothiazide) .Marland Kitchen... Take 1 tablet by mouth once a day    Metoprolol Tartrate 50 Mg Tabs (Metoprolol tartrate) .Marland Kitchen... Take 1 tab two times a day    Metoprolol Tartrate 25 Mg Tabs (Metoprolol tartrate) .Marland Kitchen... 1 tab by mouth two times a day along with 50 mg tablet  BP today: 146/70 Prior BP: 142/84 (08/09/2008)  10 Yr Risk Heart Disease: 17 % Prior 10 Yr Risk Heart Disease: Not enough information (08/06/2007)  Labs Reviewed: Creat: 1.0 (11/01/2008) Chol: 218 (11/01/2008)   HDL: 36.1 (11/01/2008)   LDL: 132.9 (11/01/2008)   TG: 164 (11/01/2008)   Problem # 2:  HYPERLIPIDEMIA (ICD-272.4) Moderately well controlled. Trig and HDL not exactly at goal. Info given on diet changes. Receck in 1 year.  Labs Reviewed: Chol: 218 (11/01/2008)   HDL: 36.1 (11/01/2008)   LDL: 132.9 (11/01/2008)   TG: 164 (11/01/2008) SGOT: 17 (11/01/2008)   SGPT: 15 (11/01/2008)  Lipid Goals: Chol Goal: 200 (11/06/2008)   HDL Goal: 40  (11/06/2008)   LDL Goal: 130 (11/06/2008)   TG Goal: 150 (11/06/2008)  10 Yr Risk Heart Disease: 17 % Prior 10 Yr Risk Heart Disease: Not enough information (08/06/2007)   Problem # 3:  PREDIABETES (ICD-790.29) Counseled on diet and exercise cahnges. Info given.  Receck in 1 year.  Labs Reviewed: Creat: 1.0 (11/01/2008)      Problem # 4:  Preventive Health Care (ICD-V70.0) Reviewed preventive care protocols, scheduled due services, and updated immunizations. Encouraged exercise, weight loss, healthy eating habits.     Problem # 5:  NEOPLASM, SKIN, UNCERTAIN BEHAVIOR (ICD-238.2) Assessment: New Skin lesion left forearm concerning for [facility]  carcinoma vs basal cell. Schedule shave with cautery/curretage.  Complete Medication List: 1)  Multivitamins Tabs (Multiple vitamin) .... Once daily 2)  Caltrate 600+d 600-400 Mg-unit Tabs (Calcium carbonate-vitamin d) .Marland Kitchen.. 1 by mouth two times a day 3)  Tylenol 325 Mg Tabs (Acetaminophen) .... As needed 4)  Lisinopril-hydrochlorothiazide 10-12.5 Mg Tabs (Lisinopril-hydrochlorothiazide) .... Take 1 tablet by mouth once a day 5)  Metoprolol Tartrate 50 Mg Tabs (Metoprolol tartrate) .... Take 1 tab two times a day 6)  Meloxicam 7.5 Mg Tabs (Meloxicam) .... Take 1 tablet by mouth once a day  as needed 7)  Metoprolol Tartrate 25 Mg Tabs (Metoprolol tartrate) .Marland Kitchen.. 1 tab by mouth two times a day along with 50 mg tablet 8)  Aspirin 81 Mg Tabs (Aspirin) .... Take 1 tablet by mouth once a day 9)  Omeprazole 40 Mg Cpdr (Omeprazole) .... Take 1 tablet by mouth once a day 10)  Glucosamine Sulfate 500 Mg Caps (Glucosamine sulfate) 11)  Aleve 220 Mg Tabs (Naproxen sodium) .... As needed  Hypertension Assessment/Plan:      The patient's hypertensive risk group is category B: At least one risk factor (excluding diabetes) with no target organ damage.  Her calculated 10 year risk of coronary heart disease is 17 %.  Today's blood pressure is 146/70.  Her blood pressure  goal is < 140/90.  Lipid Assessment/Plan:      Based on NCEP/ATP III, the patient's risk factor category is "2 or more risk factors and a calculated 10 year CAD risk of < 20%".  From this information, the patient's calculated lipid goals are as follows: Total cholesterol goal is 200; LDL cholesterol goal is 130; HDL cholesterol goal is 40; Triglyceride goal is 150.  Her LDL cholesterol goal has not been met.     Patient Instructions: 1)  When finished fluid pill, start new BP medicaine HCTZ/lisinopril. 2)  7- 10 days after starting new BP medicine, come in for labs, Dx 401.1 BMET. Non-fasting.  3)  Call for mammogram and bone density in 05/2009. 4)  Please schedule a follow-up appointment in 1 month BP check. 5)  Also make appt for 30 min procedure to remove skin lesion.   Prescriptions: LISINOPRIL-HYDROCHLOROTHIAZIDE 10-12.5 MG TABS (LISINOPRIL-HYDROCHLOROTHIAZIDE) Take 1 tablet by mouth once a day  #30 x 11   Entered and Authorized by:   Kerby Nora MD   Signed by:   Kerby Nora MD on 11/06/2008   Method used:   Electronically to        [id number]ail[facility]       [address]       [place], [place]  [address]       Ph: [phone] or [phone]       Fax: [phone]   RxID:   [id number]   Current Allergies (reviewed today): No known allergies  Current Medications (including changes made in today's visit):  MULTIVITAMINS   TABS (MULTIPLE VITAMIN) once daily CALTRATE 600+D 600-400 MG-UNIT  TABS (CALCIUM CARBONATE-VITAMIN D) 1 by mouth two times a day TYLENOL 325 MG  TABS (ACETAMINOPHEN) as needed LISINOPRIL-HYDROCHLOROTHIAZIDE 10-12.5 MG TABS (LISINOPRIL-HYDROCHLOROTHIAZIDE) Take 1 tablet by mouth once a day METOPROLOL TARTRATE 50 MG  TABS (METOPROLOL TARTRATE) Take 1 tab two times a day MELOXICAM 7.5 MG  TABS (MELOXICAM) Take 1 tablet by mouth once a day  as needed METOPROLOL TARTRATE 25 MG  TABS (METOPROLOL TARTRATE) 1 tab by mouth two times a day  along with 50 mg tablet  ASPIRIN 81 MG  TABS (ASPIRIN) Take 1 tablet by mouth once a day OMEPRAZOLE 40 MG CPDR (OMEPRAZOLE) Take 1 tablet by mouth once a day GLUCOSAMINE SULFATE 500 MG CAPS (GLUCOSAMINE SULFATE)  ALEVE 220 MG TABS (NAPROXEN SODIUM) as needed .Patient confirmed meds from list given at front desk with corrections made by CMA.   Flex Sig Next Due:  Not Indicated Hemoccult Next Due:  Not Indicated PAP Next Due:  3 yr DRE 2009     Tetanus/Td Vaccine (to be given today)   Appended Document: CPX/RBH   Tetanus/Td Vaccine    Vaccine Type: Td    Site: left deltoid    Mfr: Sanofi Pasteur    Dose: 0.5 ml    Route: IM    Given by: Delilah Shan    Exp. Date: 03/14/2010    Lot #: [id number]    VIS given: 09/07/07 version given November 06, 2008.  H1N1 # 1    Vaccine Type: H1N1 vaccine G code    Site: right deltoid    Mfr: Sanofi Pasteur    Dose: 0.5 ml    Route: IM    Given by: Delilah Shan    Exp. Date: 03/05/2010    Lot #: [id number]    VIS given: 07/20/2009 given November 06, 2008.

## 2010-11-20 NOTE — Letter (Signed)
Summary: Results Follow up Letter  [place] [facility]  [address] [place], [place] [address]   Phone: [phone]  Fax: [phone]    05/29/2008 MRN: [id number]  [name] [address] [place], [place]  [address]  Dear Ms. Cech,  The following are the results of your recent test(s):  Test         Result    Pap Smear:        Normal _____  Not Normal _____ Comments: ______________________________________________________ Cholesterol: LDL(Bad cholesterol):         Your goal is less than:         HDL (Good cholesterol):       Your goal is more than: Comments:  ______________________________________________________ Mammogram:        Normal __X___  Not Normal _____ Comments:  Routine yearly follow up is recommended.  You will be due again: 05/2009  ___________________________________________________________________ Hemoccult:        Normal _____  Not normal _______ Comments:    _____________________________________________________________________ Other Tests:    We routinely do not discuss normal results over the telephone.  If you desire a copy of the results, or you have any questions about this information we can discuss them at your next office visit.   Sincerely,     Excell Seltzer, M.D.  AEB:lsf

## 2010-11-20 NOTE — Letter (Signed)
Summary: EGD Instructions  [facility]  [address] [place], [place] [address]   Phone: [phone]  Fax: [phone]       [name]    05-29-1925    MRN: [id number]       Procedure Day Dorna Bloom: [name], 12/26/09     Arrival Time: 10:30 AM     Procedure Time: 11:30 AM     Location of Procedure:                    _X_ [facility] (4th Floor)  PREPARATION FOR ENDOSCOPY   On WEDNESDAY, 12/26/09 THE DAY OF THE PROCEDURE:  1.   No solid foods, milk or milk products are allowed after midnight the night before your procedure.  2.   Do not drink anything colored red or purple.  Avoid juices with pulp.  No orange juice.  3.  You may drink clear liquids until 9:30 AM, which is 2 hours before your procedure.                                                                                                CLEAR LIQUIDS INCLUDE: Water Jello Ice Popsicles Tea (sugar ok, no milk/cream) Powdered fruit flavored drinks Coffee (sugar ok, no milk/cream) Gatorade Juice: apple, white grape, white cranberry  Lemonade Clear bullion, consomm, broth Carbonated beverages (any kind) Strained chicken noodle soup Hard Candy   MEDICATION INSTRUCTIONS  Unless otherwise instructed, you should take regular prescription medications with a small sip of water as early as possible the morning of your procedure.                    OTHER INSTRUCTIONS  You will need a responsible adult at least 75 years of age to accompany you and drive you home.   This person must remain in the waiting room during your procedure.  Wear loose fitting clothing that is easily removed.  Leave jewelry and other valuables at home.  However, you may wish to bring a book to read or an iPod/MP3 player to listen to music as you wait for your procedure to start.  Remove all body piercing jewelry and leave at home.  Total time from sign-in until discharge is approximately 2-3  hours.  You should go home directly after your procedure and rest.  You can resume normal activities the day after your procedure.  The day of your procedure you should not:   Drive   Make legal decisions   Operate machinery   Drink alcohol   Return to work  You will receive specific instructions about eating, activities and medications before you leave.    The above instructions have been reviewed and explained to me by   [facility], CMA    I fully understand and can verbalize these instructions _____________________________ Date _________

## 2010-11-20 NOTE — Assessment & Plan Note (Signed)
Summary: EMR PILOT   Visit Type:  Consult Referred by:  Ermalene Searing PCP:  Ermalene Searing  Chief Complaint:  epigastric pain.  History of Present Illness: Intermittent epigastric pain for at least several months. Accompanied by some early satiety. No pattern or persistent precipitants. Prilosec didn't help much, neither did Aciphex.. Not affected bty meals. No dysphagia or reflux. Some nausea. GI ROS otherwise negative. Perhaps a 5 # weight loss. Does drink coffee but no excessive caffeine.  Has been very active, worked up until 2 yrs ago ([employer]), previously at [employer].    Current Allergies (reviewed today): No known allergies   Past Medical History:    Reviewed history from 08/20/2007 and no changes required:       Current Problems:        OSTEOARTHRITIS (ICD-715.90)       GERD (ICD-530.81)       HYPERLIPIDEMIA (ICD-272.4)       SYMPTOM, MALAISE AND FATIGUE NEC (ICD-780.79)       HYPERTENSION, BENIGN (ICD-401.1)       CHEST PAIN, ATYPICAL (ICD-786.59)          Past Surgical History:    left knee replacement Charlann Boxer)    Appendectomy    shoulder dislocation right shoulder (Supple)    10/08 nuclear Cardiolyte neg   Family History:    Reviewed history from 08/06/2007 and no changes required:       father died age 74 CAD       mother died age 75 old age       4 sisters: HTN       no cancer       MI < age 81  Social History:    Reviewed history from 08/06/2007 and no changes required:       Retired [employer], Arboriculturist, [employer]       Never Smoked       widowed       Alcohol use-no       Drug use-no       Regular exercise-yes, walks       Diet: fruit and veggies   Risk Factors: Tobacco use:  never Drug use:  no Alcohol use:  no Exercise:  yes  Family History Risk Factors:    Family History of MI in females < 41 years old:  no    Family History of MI in males < 76 years old:  no  Mammogram History:    Date of Last Mammogram:  05/07/2007    Review of Systems       The patient complains of abdominal pain.  The patient denies anorexia, fever, weight loss, vision loss, decreased hearing, hoarseness, chest pain, syncope, dyspnea on exhertion, peripheral edema, prolonged cough, hemoptysis, melena, hematochezia, severe indigestion/heartburn, hematuria, incontinence, genital sores, muscle weakness, suspicious skin lesions, transient blindness, difficulty walking, depression, unusual weight change, abnormal bleeding, enlarged lymph nodes, angioedema, breast masses, and testicular masses.         Some joint pain. ROS otherwise negative.   Vital Signs:  Patient Profile:   77 Years Old Female Height:     63 inches Weight:      165 pounds BMI:     29.33 Pulse rate:   80 / minute BP sitting:   148 / 70                 Physical Exam  General:     Well developed, well nourished, no acute distress. Head:  Normocephalic and atraumatic. Ears:     Normal auditory acuity. Mouth:     No deformity or lesions, dentition normal. Neck:     Supple; no masses or thyromegaly. Lungs:     Clear throughout to auscultation. Heart:     Regular rate and rhythm; no murmurs, rubs,  or bruits. Abdomen:     Soft, nontender and nondistended. No masses, hepatosplenomegaly or hernias noted. Normal bowel sounds. Extremities:     No clubbing, cyanosis, edema or deformities noted. Neurologic:     Alert and  oriented x4;  grossly normal neurologically. Cervical Nodes:     No significant  cervical or inguinal adenopathy. Psych:     Alert and cooperative. Normal mood and affect.     Impression & Recommendations:  Problem # 1:  EPIGASTRIC PAIN (ICD-789.06) Assessment: Unchanged Needs EGD to assess.  Informed consent discussion occurred.  Problem # 2:  EARLY SATIETY (ICD-780.94) Assessment: New Needs EGD to assess   Patient Instructions: 1)  Consider a colonoscopy (screening) depending upon EGD results.    ]

## 2010-11-20 NOTE — Assessment & Plan Note (Signed)
Summary: f/u dlo   Vital Signs:  Patient Profile:   77 Years Old Female Height:     63 inches Weight:      169 pounds Temp:     97.6 degrees F oral Pulse rate:   80 / minute Pulse rhythm:   regular BP sitting:   142 / 84  (left arm) Cuff size:   regular  Vitals Entered By: Delilah Shan (August 09, 2008 9:06 AM)                 Referred by:  Ermalene Searing PCP:  Ermalene Searing  Chief Complaint:  F/U.  History of Present Illness: BP at home most often around  135/62, 122/57, twice 140/72, 144/57  Doing well, no acute concerns     Current Allergies (reviewed today): No known allergies   Past Medical History:    Reviewed history from 12/23/2007 and no changes required:       OSTEOARTHRITIS (ICD-715.90)       GERD (ICD-530.81)       HYPERLIPIDEMIA (ICD-272.4)       SYMPTOM, MALAISE AND FATIGUE NEC (ICD-780.79)       HYPERTENSION, BENIGN (ICD-401.1)       CHEST PAIN, ATYPICAL (ICD-786.59)       GASTRIC ULCER   Social History:    Reviewed history from 08/06/2007 and no changes required:       Retired [employer], Arboriculturist, [employer]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  CV      Denies chest pain or discomfort.  Resp      Denies shortness of breath.  GI      Denies abdominal pain, bloody stools, and constipation.   Physical Exam  General:     Elderly female overweight in NAD Mouth:     Oral mucosa and oropharynx without lesions or exudates.  Teeth in good repair. Neck:     no carotid bruit or thyromegaly  Lungs:     Normal respiratory effort, chest expands symmetrically. Lungs are clear to auscultation, no crackles or wheezes. Heart:     Normal rate and regular rhythm. S1 and S2 normal without gallop, murmur, click, rub or other extra sounds. Skin:     lipoma on right neck, slight  invcrease in size 6cm diameter, multiple SKs on back    Impression & Recommendations:  Problem # 1:  HYPERTENSION, BENIGN (ICD-401.1) Borderline.  She has been eating a lot of salt on vacation lately. Encouraged exercise, weight loss, healthy eating habits.  Follow at home if >140/90 call.  Her updated medication list for this problem includes:    Hydrochlorothiazide 25 Mg Tabs (Hydrochlorothiazide) .Marland Kitchen... Take 1 tablet by mouth once a day    Metoprolol Tartrate 50 Mg Tabs (Metoprolol tartrate) .Marland Kitchen... Take 1 tab two times a day    Metoprolol Tartrate 25 Mg Tabs (Metoprolol tartrate) .Marland Kitchen... 1 tab by mouth two times a day along with 50 mg tablet   Complete Medication List: 1)  Multivitamins Tabs (Multiple vitamin) .... Once daily 2)  Caltrate 600+d 600-400 Mg-unit Tabs (Calcium carbonate-vitamin d) .Marland Kitchen.. 1 by mouth two times a day 3)  Tylenol 325 Mg Tabs (Acetaminophen) .... As needed 4)  Hydrochlorothiazide 25 Mg Tabs (Hydrochlorothiazide) .... Take 1 tablet by mouth once a day 5)  Metoprolol Tartrate 50 Mg Tabs (Metoprolol tartrate) .... Take 1 tab two times a day 6)  Meloxicam 7.5 Mg Tabs (Meloxicam) .... Take 1 tablet by mouth once a day  as needed 7)  Metoprolol Tartrate 25 Mg Tabs (Metoprolol tartrate) .Marland Kitchen.. 1 tab by mouth two times a day along with 50 mg tablet 8)  Aspirin 81 Mg Tabs (Aspirin) .... Take 1 tablet by mouth once a day 9)  Omeprazole 40 Mg Cpdr (Omeprazole) .... Take 1 tablet by mouth once a day 10)  Glucosamine Sulfate 500 Mg Caps (Glucosamine sulfate)   Patient Instructions: 1)  CPE in 3 months, labs prior 2)  CMET, lipids Dx 272.0   ] Current Allergies (reviewed today): No known allergies  Current Medications (including changes made in today's visit):  MULTIVITAMINS   TABS (MULTIPLE VITAMIN) once daily CALTRATE 600+D 600-400 MG-UNIT  TABS (CALCIUM CARBONATE-VITAMIN D) 1 by mouth two times a day TYLENOL 325 MG  TABS (ACETAMINOPHEN) as needed HYDROCHLOROTHIAZIDE  25 MG  TABS (HYDROCHLOROTHIAZIDE) Take 1 tablet by mouth once a day METOPROLOL TARTRATE 50 MG  TABS (METOPROLOL TARTRATE) Take 1 tab two times a day MELOXICAM 7.5 MG  TABS (MELOXICAM) Take 1 tablet by mouth once a day  as needed METOPROLOL TARTRATE 25 MG  TABS (METOPROLOL TARTRATE) 1 tab by mouth two times a day along with 50 mg tablet ASPIRIN 81 MG  TABS (ASPIRIN) Take 1 tablet by mouth once a day OMEPRAZOLE 40 MG CPDR (OMEPRAZOLE) Take 1 tablet by mouth once a day GLUCOSAMINE SULFATE 500 MG CAPS (GLUCOSAMINE SULFATE)   Last Flu Vaccine:  Historical (07/21/2007 12:31:56 PM) Flu Vaccine Result Date:  07/20/2008 Flu Vaccine Result:  given

## 2010-11-20 NOTE — Consult Note (Signed)
Summary: [facility]  [facility]   Imported By: Lanelle Bal 10/24/2009 08:58:44  _____________________________________________________________________  External Attachment:    Type:   Image     Comment:   External Document

## 2010-11-20 NOTE — Assessment & Plan Note (Signed)
Summary: CPX/DLO   Vital Signs:  Patient profile:   77 year old female Weight:      164.25 pounds BMI:     29.20 Temp:     97.9 degrees F oral Pulse rate:   60 / minute Pulse rhythm:   regular BP sitting:   110 / 70  (left arm) Cuff size:   regular  Vitals Entered By: Linde Gillis CMA Duncan Dull) (November 23, 2009 2:48 PM) CC: 30 minute exam, Hypertension Management, Lipid Management   History of Present Illness:  Still having central chest pain, x several years  no burning, occ nausea...Marland Kitchenno relationship to eating..feels swelling in epigastrum.  Has had ENDO in 2009.Marland KitchenMarland KitchenPUD and erosive esophagitis was onomeprazole .  Trying nexium did not help any more.  Has upcoming appt for recheck with Dr. Elberta Leatherwood...to reeval. Using tramadol ever few weeks for nknee pain.   CT Chest neg except pulm nodule..has upcoming 6 month recehk schedules.  Cardiolyte neg 2008.   Otherwise doing wll.    Hypertension History:      She denies headache, palpitations, peripheral edema, syncope, and side effects from treatment.  Well controlled. ON current medication. Marland Kitchen        Positive major cardiovascular risk factors include female age 44 years old or older, hyperlipidemia, and hypertension.  Negative major cardiovascular risk factors include no history of diabetes, negative family history for ischemic heart disease, and non-tobacco-user status.        Further assessment for target organ damage reveals no history of ASHD, cardiac end-organ damage (CHF/LVH), stroke/TIA, peripheral vascular disease, renal insufficiency, or hypertensive retinopathy.    Lipid Management History:      Positive NCEP/ATP III risk factors include female age 32 years old or older, HDL cholesterol less than 40, and hypertension.  Negative NCEP/ATP III risk factors include non-diabetic, no family history for ischemic heart disease, non-tobacco-user status, no ASHD (atherosclerotic heart disease), no prior stroke/TIA, and no peripheral vascular  disease.        The patient states that she does not know about the "Therapeutic Lifestyle Change" diet.  Her compliance with the TLC diet is good.  The patient expresses understanding of adjunctive measures for cholesterol lowering.  Adjunctive measures started by the patient include aerobic exercise, omega-3 supplements, and limit alcohol consumpton.      Problems Prior to Update: 1)  Skin Lesion  (ICD-709.9) 2)  Osteopenia  (ICD-733.90) 3)  Computerized Tomography, Chest, Abnormal  (ICD-793.1) 4)  Special Screening For Osteoporosis  (ICD-V82.81) 5)  Other Screening Mammogram  (ICD-V76.12) 6)  Need Prophylactic Vaccination&inoculation Flu  (ICD-V04.81) 7)  Prediabetes  (ICD-790.29) 8)  Generalized Osteoarthrosis Unspecified Site  (ICD-715.00) 9)  Neoplasm, Skin, Uncertain Behavior  (ICD-238.2) 10)  Pud  (ICD-533.90) 11)  Erosive Esophagitis  (ICD-530.19) 12)  Special Screening Malig Neoplasms Other Sites  (ICD-V76.49) 13)  Osteoarthritis  (ICD-715.90) 14)  Gerd  (ICD-530.81) 15)  Hyperlipidemia  (ICD-272.4) 16)  Symptom, Malaise and Fatigue Nec  (ICD-780.79) 17)  Hypertension, Benign  (ICD-401.1) 18)  Chest Pain, Atypical  (ICD-786.59)  Current Medications (verified): 1)  Multivitamins   Tabs (Multiple Vitamin) .... Once Daily 2)  Caltrate 600+d 600-400 Mg-Unit  Tabs (Calcium Carbonate-Vitamin D) .Marland Kitchen.. 1 By Mouth Two Times A Day 3)  Tylenol 325 Mg  Tabs (Acetaminophen) .... As Needed 4)  Lisinopril-Hydrochlorothiazide 20-25 Mg Tabs (Lisinopril-Hydrochlorothiazide) .Marland Kitchen.. 1 Tab By Mouth Daily 5)  Metoprolol Tartrate 50 Mg  Tabs (Metoprolol Tartrate) .... Take 1 Tab Two Times  A Day 6)  Metoprolol Tartrate 25 Mg  Tabs (Metoprolol Tartrate) .Marland Kitchen.. 1 Tab By Mouth Two Times A Day Along With 50 Mg Tablet 7)  Aspirin 81 Mg  Tabs (Aspirin) .... Take 1 Tablet By Mouth Once A Day 8)  Glucosamine Sulfate 500 Mg Caps (Glucosamine Sulfate) 9)  Tramadol Hcl 50 Mg Tabs (Tramadol Hcl) .Marland Kitchen.. 1 Tab By  Mouth Daily As Needed Severe Knee Pain 10)  Prilosec 40 Mg Cpdr (Omeprazole) .... Take One Tablet By Mouth Daily  Allergies (verified): No Known Drug Allergies  Past History:  Past medical, surgical, family and social histories (including risk factors) reviewed, and no changes noted (except as noted below).  Past Medical History: Reviewed history from 12/23/2007 and no changes required. OSTEOARTHRITIS (ICD-715.90) GERD (ICD-530.81) HYPERLIPIDEMIA (ICD-272.4) SYMPTOM, MALAISE AND FATIGUE NEC (ICD-780.79) HYPERTENSION, BENIGN (ICD-401.1) CHEST PAIN, ATYPICAL (ICD-786.59) GASTRIC ULCER  Past Surgical History: Reviewed history from 06/15/2009 and no changes required. left knee replacement Charlann Boxer) Appendectomy shoulder dislocation right shoulder (Supple) 10/08 nuclear Cardiolyte neg 05/2009 knee surgery to cut nerves  Family History: Reviewed history from 08/06/2007 and no changes required. father died age 31 CAD mother died age 70 old age 40 sisters: HTN no cancer MI < age 401  Social History: Reviewed history from 08/06/2007 and no changes required. 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 Resp:  Denies shortness of breath, sputum productive, and wheezing. GI:  Denies bloody stools, nausea, and vomiting; still has central chest pain. GU:  Denies dysuria. Derm:  Denies lesion(s) and rash. Psych:  Denies anxiety and depression.  Physical Exam  General:  elderly female in NAD Ears:  B ears occluded with wax..ears irrigated By NT Nose:  External nasal examination shows no deformity or inflammation. Nasal mucosa are pink and moist without lesions or exudates. Mouth:  MMM Neck:  no carotid bruit or thyromegaly no cervical or supraclavicular lymphadenopathy  Chest Wall:  over Xiphoid pouch of soft tissue,  most apparent when sitting up...on deep palpation tender over sternum...no clear mass, or bone lesion  Lungs:  Normal respiratory effort, chest expands symmetrically. Lungs are clear to auscultation, no crackles or wheezes. Heart:  Normal rate and regular rhythm. S1 and S2 normal without gallop, murmur, click, rub or other extra sounds. Abdomen:  Bowel sounds positive,abdomen soft and non-tender without masses, organomegaly or hernias noted. Pulses:  R and L posterior tibial pulses are full and equal bilaterally  Extremities:  no edema  Skin:  nontender lipoma right neck erythema under right breast and right armpit consistent with intertrigo Psych:  Cognition and judgment appear intact. Alert and cooperative with normal attention span and concentration. No apparent delusions, illusions, hallucinations   Impression & Recommendations:  Problem # 1:  INTERTRIGO (ZOX-096.04) Treat with topical Nystatin cream two times a day x 2 weeks .   Problem # 2:  PREDIABETES (ICD-790.29) Encouraged exercise, weight loss, healthy eating habits. Stable.   Problem # 3:  HYPERLIPIDEMIA (ICD-272.4) LDL at goal, buttriglycerides elevated. Discussed increasing exercise and working on healthy eating habits.   Problem # 4:  HYPERTENSION, BENIGN (ICD-401.1) Well controlled. Continue current medication.  Her updated medication list for this problem includes:    Lisinopril-hydrochlorothiazide 20-25 Mg Tabs (Lisinopril-hydrochlorothiazide) .Marland Kitchen... 1 tab by mouth daily    Metoprolol Tartrate 50 Mg Tabs (Metoprolol tartrate) .Marland Kitchen... Take 1 tab two times a day  Metoprolol Tartrate 25 Mg Tabs (Metoprolol tartrate) .Marland Kitchen... 1 tab by mouth two times a day along with 50 mg tablet  Problem # 5:  CHEST PAIN, ATYPICAL (ICD-786.59) ? GERD, PUD return due to tramadol. Stop. Continue omeprazole..follow up with GI for possible repeat ENDo.  Pt still concerned about prominent tissue at ziphoid prpocess...CT scan neg for structural  issue. Likely just fatty deposit in overwieght female.   Problem # 6:  COMPUTERIZED TOMOGRAPHY, CHEST, ABNORMAL (ICD-793.1) Pt low risk for lung cancer..so if neg repeat CT will likely stop follow up CTs.   Complete Medication List: 1)  Multivitamins Tabs (Multiple vitamin) .... Once daily 2)  Caltrate 600+d 600-400 Mg-unit Tabs (Calcium carbonate-vitamin d) .Marland Kitchen.. 1 by mouth two times a day 3)  Tylenol 325 Mg Tabs (Acetaminophen) .... As needed 4)  Lisinopril-hydrochlorothiazide 20-25 Mg Tabs (Lisinopril-hydrochlorothiazide) .Marland Kitchen.. 1 tab by mouth daily 5)  Metoprolol Tartrate 50 Mg Tabs (Metoprolol tartrate) .... Take 1 tab two times a day 6)  Metoprolol Tartrate 25 Mg Tabs (Metoprolol tartrate) .Marland Kitchen.. 1 tab by mouth two times a day along with 50 mg tablet 7)  Aspirin 81 Mg Tabs (Aspirin) .... Take 1 tablet by mouth once a day 8)  Glucosamine Sulfate 500 Mg Caps (Glucosamine sulfate) 9)  Prilosec 40 Mg Cpdr (Omeprazole) .... Take one tablet by mouth daily 10)  Nystatin 100000 Unit/gm Crea (Nystatin) .... Apply to affected area two times a day x 2 week, continue 48 hours after symptoms resolved.  Other Orders: Prescription Created Electronically [id number] [phone])  Hypertension Assessment/Plan:      The patient's hypertensive risk group is category B: At least one risk factor (excluding diabetes) with no target organ damage.  Her calculated 10 year risk of coronary heart disease is 8 %.  Today's blood pressure is 110/70.  Her blood pressure goal is < 140/90.  Lipid Assessment/Plan:      Based on NCEP/ATP III, the patient's risk factor category is "2 or more risk factors and a calculated 10 year CAD risk of < 20%".  The patient's lipid goals are as follows: Total cholesterol goal is 200; LDL cholesterol goal is 130; HDL cholesterol goal is 40; Triglyceride goal is 150.  Her LDL cholesterol goal has been met.    Patient Instructions: 1)  INcrease daily exercise.  Walk some. Decrease bread, fatty foods.  2)   Please schedule a follow-up appointment in 6 months .  3)  BMP prior to visit, ICD-9: 401.1 4)  Hepatic Panel prior to visit ICD-9:  5)  Lipid panel prior to visit ICD-9 :  6)  Stop tramadol.  7)  Call insurance about shingles vaccine.  8)  Keep appt with gastroenterologist. Prescriptions: NYSTATIN 100000 UNIT/GM CREA (NYSTATIN) Apply to affected area two times a day x 2 week, continue 48 hours after symptoms resolved.  #30 gm x 1   Entered and Authorized by:   Kerby Nora MD   Signed by:   Kerby Nora MD on 11/23/2009   Method used:   Print then Give to Patient   RxID:   [id number] PRILOSEC 40 MG CPDR (OMEPRAZOLE) take one tablet by mouth daily  #90 x 3   Entered and Authorized by:   Kerby Nora MD   Signed by:   Kerby Nora MD on 11/23/2009   Method used:   Print then Give to Patient   RxID:   [id number] METOPROLOL TARTRATE 25 MG  TABS (METOPROLOL TARTRATE) 1 tab by mouth two times a day  along with 50 mg tablet  #180 x 3   Entered and Authorized by:   Kerby Nora MD   Signed by:   Kerby Nora MD on 11/23/2009   Method used:   Print then Give to Patient   RxID:   [id number] METOPROLOL TARTRATE 50 MG  TABS (METOPROLOL TARTRATE) Take 1 tab two times a day  #180 x 3   Entered and Authorized by:   Kerby Nora MD   Signed by:   Kerby Nora MD on 11/23/2009   Method used:   Print then Give to Patient   RxID:   [id number] LISINOPRIL-HYDROCHLOROTHIAZIDE 20-25 MG TABS (LISINOPRIL-HYDROCHLOROTHIAZIDE) 1 tab by mouth daily  #90 x 3   Entered and Authorized by:   Kerby Nora MD   Signed by:   Kerby Nora MD on 11/23/2009   Method used:   Print then Give to Patient   RxID:   [id number]   Current Allergies (reviewed today): No known allergies   Last TD:  Td (11/06/2008 12:24:02 PM) TD Next Due:  10 yr Last Colonoscopy:  1) SEVERE SIGMOID DIVERTICULOSIS 2) OTHERWISE NORMAL TO SPLENIC FLEXURE 3) EXAM ABORTED DUE TO PATIENT DISCOMFORT AND DIFFICULTY ADVANCING  DUE TO LOOP. SHE IS 82 AND THIS WAS FIRST (AND LAST) ROUTINE SCREENING COLONOSCOPY ATTEMPT. (01/05/2008 12:17:07 PM) Colonoscopy Next Due:  Not Indicated

## 2010-11-20 NOTE — Assessment & Plan Note (Signed)
Summary: post egd f/u    History of Present Illness Visit Type: Follow-up Visit Primary GI MD: Stan Head MD [facility] Primary Provider: Kerby Nora, MD Chief Complaint: F/U EGD History of Present Illness:   77 yo woman with epigastric pain and mild gastritis on EGD. She does well when she takes hyoscyamne but it is costly and she is using it sparingly.   GI Review of Systems    Reports abdominal pain.     Location of  Abdominal pain: epigastric area.    Denies acid reflux, belching, bloating, chest pain, dysphagia with liquids, dysphagia with solids, heartburn, loss of appetite, nausea, vomiting, vomiting blood, weight loss, and  weight gain.        Denies anal fissure, black tarry stools, change in bowel habit, constipation, diarrhea, diverticulosis, fecal incontinence, heme positive stool, hemorrhoids, irritable bowel syndrome, jaundice, light color stool, liver problems, rectal bleeding, and  rectal pain.    EGD  Procedure date:  12/26/2009  Findings:          ENDOSCOPIC IMPRESSION:     1) Moderate gastritis in the antrum - biopsied GASTRITIS NO H.PYLORI     2) Otherwise normal examination   Current Medications (verified): 1)  Multivitamins   Tabs (Multiple Vitamin) .... Once Daily 2)  Caltrate 600+d 600-400 Mg-Unit  Tabs (Calcium Carbonate-Vitamin D) .Marland Kitchen.. 1 By Mouth Two Times A Day 3)  Tylenol 325 Mg  Tabs (Acetaminophen) .... As Needed 4)  Lisinopril-Hydrochlorothiazide 20-25 Mg Tabs (Lisinopril-Hydrochlorothiazide) .Marland Kitchen.. 1 Tab By Mouth Daily 5)  Metoprolol Tartrate 50 Mg  Tabs (Metoprolol Tartrate) .... Take 1 Tab Two Times A Day 6)  Metoprolol Tartrate 25 Mg  Tabs (Metoprolol Tartrate) .Marland Kitchen.. 1 Tab By Mouth Two Times A Day Along With 50 Mg Tablet 7)  Aspirin 81 Mg  Tabs (Aspirin) .... Take 1 Tablet By Mouth Once A Day 8)  Glucosamine Sulfate 500 Mg Caps (Glucosamine Sulfate) 9)  Prilosec 40 Mg Cpdr (Omeprazole) .... Take One Tablet By Mouth Daily 10)  Nystatin 100000  Unit/gm Crea (Nystatin) .... Apply To Affected Area Two Times A Day X 2 Week, Continue 48 Hours After Symptoms Resolved. 11)  Tramadol Hcl 50 Mg Tabs (Tramadol Hcl) .... Take As Needed Pain 12)  Hyoscyamine Sulfate 0.125 Mg Subl (Hyoscyamine Sulfate) .Marland Kitchen.. 1-2 Sl Every 4 Hrs As Needed For Abdominal Pain and Bloating  Allergies (verified): No Known Drug Allergies  Past History:  Past Medical History: Reviewed history from 12/23/2007 and no changes required. OSTEOARTHRITIS (ICD-715.90) GERD (ICD-530.81) HYPERLIPIDEMIA (ICD-272.4) SYMPTOM, MALAISE AND FATIGUE NEC (ICD-780.79) HYPERTENSION, BENIGN (ICD-401.1) CHEST PAIN, ATYPICAL (ICD-786.59) GASTRIC ULCER  Past Surgical History: Reviewed history from 06/15/2009 and no changes required. left knee replacement Charlann Boxer) Appendectomy shoulder dislocation right shoulder (Supple) 10/08 nuclear Cardiolyte neg 05/2009 knee surgery to cut nerves  Family History: Reviewed history from 08/06/2007 and no changes required. father died age 80 CAD mother died age 34 old age 40 sisters: HTN no cancer MI < age 60  Social History: Reviewed history from 12/12/2009 and no changes required. 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, grandson stays sometie and dughter lives behind her Alcohol use-no Drug use-no Regular exercise-yes, walks Diet: fruit and veggies  Vital Signs:  Patient profile:   77 year old female Height:      63 inches Weight:      164.13 pounds BMI:     29.18 Pulse rate:   64 / minute Pulse rhythm:   regular BP sitting:  120 / 68  (left arm) Cuff size:   regular  Vitals Entered By: June McMurray CMA Duncan Dull) (February 05, 2010 1:39 PM)  Physical Exam  General:  elderly female in NAD   Impression & Recommendations:  Problem # 1:  ABDOMINAL PAIN-EPIGASTRIC (ICD-789.06) Assessment Improved responds to hyoscyamine but is expensive so she will try dicyclomne weight is stable no further  work-up and follow as needed  Problem # 2:  GASTRITIS (ICD-535.50) Assessment: New mild, seen at EGD, could be part of her pain and spasm issues No H. pylori seen we reviewed these findings 15 mins total tme spentwith patient  Patient Instructions: 1)  Stop hyoscyamine and start dicyclomine for abdominal pain. 2)  Please pick up your medications at your pharmacy. 3)  This medicine should help your abdminal pain that seems to be due to spasms of the stomach and intestines. 4)  Copy sent to : Kerby Nora, MD 5)  Please schedule a follow-up appointment as needed.  6)  The medication list was reviewed and reconciled.  All changed / newly prescribed medications were explained.  A complete medication list was provided to the patient / caregiver. Prescriptions: DICYCLOMINE HCL 10 MG  CAPS (DICYCLOMINE HCL) 1-2 every 6 hrs as needed for abdominal pain  #90 x 5   Entered and Authorized by:   Iva Boop MD, [facility]   Signed by:   Iva Boop MD, FACG on 02/05/2010   Method used:   Electronically to        [facility]  [address] (retail)       [address]       [place], [place]  [address]       Ph: [phone] or [phone]       Fax: [phone]   RxID:   [id number]

## 2010-11-20 NOTE — Assessment & Plan Note (Signed)
Summary: stomach problems....em    History of Present Illness Visit Type: Follow-up Visit Primary GI MD: Stan Head MD [facility] Primary Provider: Kerby Nora, MD Chief Complaint: still c/o abdominal pain and acid reflux History of Present Illness:   She is c/o of epigastric paiin, lower chest pain and puffness in upper abdomen. It is intermittent and very painful at times. It responds to Aleve or tramdol. Triggers are not identified. Intensity varies, nagging and constant to severe. Can be nocturnal. Thinks it has been a problem x 1 year. she is not sure if this is similar to the findings she had or problems she had when I diagnosed the gastric ulcer and reflux esophagitis in 2009, with EGD.   GI Review of Systems      Denies abdominal pain, acid reflux, belching, bloating, chest pain, dysphagia with liquids, dysphagia with solids, heartburn, loss of appetite, nausea, vomiting, vomiting blood, weight loss, and  weight gain.        Denies anal fissure, black tarry stools, change in bowel habit, constipation, diarrhea, diverticulosis, fecal incontinence, heme positive stool, hemorrhoids, irritable bowel syndrome, jaundice, light color stool, liver problems, rectal bleeding, and  rectal pain.    Current Medications (verified): 1)  Multivitamins   Tabs (Multiple Vitamin) .... Once Daily 2)  Caltrate 600+d 600-400 Mg-Unit  Tabs (Calcium Carbonate-Vitamin D) .Marland Kitchen.. 1 By Mouth Two Times A Day 3)  Tylenol 325 Mg  Tabs (Acetaminophen) .... As Needed 4)  Lisinopril-Hydrochlorothiazide 20-25 Mg Tabs (Lisinopril-Hydrochlorothiazide) .Marland Kitchen.. 1 Tab By Mouth Daily 5)  Metoprolol Tartrate 50 Mg  Tabs (Metoprolol Tartrate) .... Take 1 Tab Two Times A Day 6)  Metoprolol Tartrate 25 Mg  Tabs (Metoprolol Tartrate) .Marland Kitchen.. 1 Tab By Mouth Two Times A Day Along With 50 Mg Tablet 7)  Aspirin 81 Mg  Tabs (Aspirin) .... Take 1 Tablet By Mouth Once A Day 8)  Glucosamine Sulfate 500 Mg Caps (Glucosamine Sulfate) 9)   Prilosec 40 Mg Cpdr (Omeprazole) .... Take One Tablet By Mouth Daily 10)  Nystatin 100000 Unit/gm Crea (Nystatin) .... Apply To Affected Area Two Times A Day X 2 Week, Continue 48 Hours After Symptoms Resolved. 11)  Tramadol Hcl 50 Mg Tabs (Tramadol Hcl) .... Take As Needed Pain  Allergies (verified): No Known Drug Allergies  Past History:  Past Medical History: Reviewed history from 12/23/2007 and no changes required. OSTEOARTHRITIS (ICD-715.90) GERD (ICD-530.81) HYPERLIPIDEMIA (ICD-272.4) SYMPTOM, MALAISE AND FATIGUE NEC (ICD-780.79) HYPERTENSION, BENIGN (ICD-401.1) CHEST PAIN, ATYPICAL (ICD-786.59) GASTRIC ULCER  Past Surgical History: Reviewed history from 06/15/2009 and no changes required. left knee replacement Charlann Boxer) Appendectomy shoulder dislocation right shoulder (Supple) 10/08 nuclear Cardiolyte neg 05/2009 knee surgery to cut nerves  Family History: Reviewed history from 08/06/2007 and no changes required. father died age 75 CAD mother died age 59 old age 67 sisters: HTN no cancer MI < age 677  Social History: Reviewed history from 08/06/2007 and no changes required. 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 Heart:  Regular rate and rhythm; no murmurs, rubs,  or bruits. Abdomen:  Bowel sounds positive,abdomen soft and non-tender without  masses, organomegaly or hernias noted. Neurologic:  Alert and  oriented x3 Psych:  Alert and cooperative. Normal mood and affect.   Impression & Recommendations:  Problem # 1:  ABDOMINAL PAIN-EPIGASTRIC (ICD-789.06) Assessment New  ? recurrent ulcer or deterioriated GERD sxs not revealing no warning signs of weight loss or dysphagia but given age and prior gastric ulcer 2009 (bxs benign) will repeat EGD may need a different PPI, note, samples of Nexium did not help. Risks, benefits,and indications of endoscopic procedure(s) were reviewed with the patient and all questions answered.  Orders: EGD (EGD)  Problem # 2:  CHEST PAIN, ATYPICAL (ICD-786.59) Assessment: Deteriorated  lower chest pain, ? if part of epigastric pain and GI in nature await EGD see epigastrc pain assessment  Orders: EGD (EGD)  Patient Instructions: 1)  We will see you at your procedure on 12/26/09 2)  [facility] Patient Information Guide given to patient.  3)  Upper Endoscopy brochure given.  4)  Copy sent to : Kerby Nora, MD 5)  The medication list was reviewed and reconciled.  All changed / newly prescribed medications were explained.  A complete medication list was provided to the patient / caregiver.

## 2010-11-20 NOTE — Progress Notes (Signed)
  Phone Note Call from Patient   Details for Reason: BP measurements Summary of Call: 7/25 151/76, 7/28 153/74, 8/3 144/59  8/6 142/ 65. Initial call taken by: Kerby Nora MD,  May 26, 2008 8:08 AM  Follow-up for Phone Call        Notify pt that BPs elevated, we need to increase her metoprolol to one and a half tablets twice daily.  If she cannot cut pills, I can get her 25 mg tablets to take with the 50 mg. Keep follow up appt.  Follow-up by: Kerby Nora MD,  May 26, 2008 8:11 AM  Additional Follow-up for Phone Call Additional follow up Details #1::        Patient Advised.   She will phone back to let me know if she does well with splitting them in half or if she needs a 25 mg. tab phoned in.  Regardless, she will need a new Rx. because of the dosage increase when it comes time to refill. Additional Follow-up by: Delilah Shan,  May 26, 2008 9:01 AM    New/Updated Medications: METOPROLOL TARTRATE 50 MG  TABS (METOPROLOL TARTRATE) Take 1 and 1/2  tablet by mouth two times a day     Appended Document:  Patient says it is hard to cut the tablets in half. Can you send in a Rx. [facility] mg[facility] tabs [facility] [place]?  Appended Document: Orders Update Medications Added METOPROLOL TARTRATE 50 MG  TABS (METOPROLOL TARTRATE) Take 1 tab two times a day METOPROLOL TARTRATE 25 MG  TABS (METOPROLOL TARTRATE) 1 tab by mouth two times a day along with 50 mg tablet          Clinical Lists Changes  Medications: Changed medication from METOPROLOL TARTRATE 50 MG  TABS (METOPROLOL TARTRATE) Take 1 and 1/2  tablet by mouth two times a day to METOPROLOL TARTRATE 50 MG  TABS (METOPROLOL TARTRATE) Take 1 tab two times a day Added new medication of METOPROLOL TARTRATE 25 MG  TABS (METOPROLOL TARTRATE) 1 tab by mouth two times a day along with 50 mg tablet - Signed Rx of METOPROLOL TARTRATE 25 MG  TABS (METOPROLOL TARTRATE) 1 tab by mouth two times a day along with 50 mg tablet;  #60 x 5;  Signed;   Entered by: Kerby Nora MD;  Authorized by: Kerby Nora MD;  Method used: Electronic    Prescriptions: METOPROLOL TARTRATE 25 MG  TABS (METOPROLOL TARTRATE) 1 tab by mouth two times a day along with 50 mg tablet  #60 x 5   Entered and Authorized by:   Kerby Nora MD   Signed by:   Kerby Nora MD on 05/26/2008   Method used:   [facility] ...       [facility]  [address] [phone]*       [address]       [place], [place]  [address]       Ph: [phone]       Fax: [phone]   RxID:   [id number]

## 2010-11-20 NOTE — Assessment & Plan Note (Signed)
Summary: 30 MIN APPT PER MD 1 WEEK FOLLOW UP/[facility]   Vital Signs:  Patient Profile:   77 Years Old Female Weight:      170 pounds Temp:     98.1 degrees F oral Pulse rate:   80 / minute Pulse rhythm:   regular BP sitting:   150 / 100  (left arm) Cuff size:   regular  Vitals Entered By: Providence Crosby (August 06, 2007 12:18 PM)                 Chief Complaint:  f/u.  History of Present Illness: intermittant chest pain continued, unchanged no associated symptoms, see previous OV note Cardiolyte neg.   Hypertension History:      She complains of chest pain, but denies palpitations, dyspnea with exertion, orthopnea, PND, peripheral edema, and side effects from treatment.  She notes no problems with any antihypertensive medication side effects.        Positive major cardiovascular risk factors include female age 58 years old or older, hyperlipidemia, and hypertension.  Negative major cardiovascular risk factors include non-tobacco-user status.     Current Allergies: No known allergies   Past Medical History:    Reviewed history from 07/30/2007 and no changes required:       Hypertension  Past Surgical History:    left knee replacement    Appendectomy    shoulder dislocation right shoulder    10/08 nuclear Cardiolyte neg   Family History:    Reviewed history and no changes required:       father died age 38 CAD       mother died age 83 old age       4 sisters: HTN       no cancer       MI < age 30  Social History:    Retired Optometrist, Arboriculturist, Academic librarian    Never Smoked    widowed    Alcohol use-no    Drug use-no    Regular exercise-yes, walks    Diet: fruit and veggies   Risk Factors:  Tobacco use:  never Drug use:  no Alcohol use:  no Exercise:  yes   Review of Systems      See HPI   Physical Exam  General:     elderly female in NAD Chest Wall:     No deformities, masses, or tenderness noted. Lungs:     Normal respiratory  effort, chest expands symmetrically. Lungs are clear to auscultation, no crackles or wheezes. Heart:     Normal rate and regular rhythm. S1 and S2 normal without gallop, murmur, click, rub or other extra sounds.    Impression & Recommendations:  Problem # 1:  CHEST PAIN, ATYPICAL (ICD-786.59) neg cardiolyte. Likely secondary to MSK.     Problem # 2:  HYPERTENSION (ICD-401.9) Increase metoprolol and add HCTZ.  Check cardiac risk factors. Her updated medication list for this problem includes:    Metoprolol Succinate 50 Mg Tb24 (Metoprolol succinate) .Marland Kitchen... Take 1 tablet by mouth once a day    Hydrochlorothiazide 25 Mg Tabs (Hydrochlorothiazide) .Marland Kitchen... Take 1 tablet by mouth once a day  BP today: 150/100 Prior BP: 184/100 (07/30/2007)  10 Yr Risk Heart Disease: Not enough information   Complete Medication List: 1)  Multivitamins Tabs (Multiple vitamin) .... Once daily 2)  Caltrate 600+d 600-400 Mg-unit Tabs (Calcium carbonate-vitamin d) .Marland Kitchen.. 1 by mouth two times a day 3)  Aleve 220 Mg Tabs (Naproxen sodium) .Marland KitchenMarland KitchenMarland Kitchen  Daily as needed 4)  Tylenol 325 Mg Tabs (Acetaminophen) .... As needed 5)  Metoprolol Succinate 50 Mg Tb24 (Metoprolol succinate) .... Take 1 tablet by mouth once a day 6)  Hydrochlorothiazide 25 Mg Tabs (Hydrochlorothiazide) .... Take 1 tablet by mouth once a day  Hypertension Assessment/Plan:      The patient's hypertensive risk group is category B: At least one risk factor (excluding diabetes) with no target organ damage.  Today's blood pressure is 150/100.  Her blood pressure goal is < 140/90.   Patient Instructions: 1)  Fasting labs Dx v77.91 lipids, CMET 2)  Stool cards 3)  Increase metoprolol to 2 tablets daily till finished lower dose, then fill prescription for a higher dose. 4)  Start hydrochlorothiazide today.    Prescriptions: HYDROCHLOROTHIAZIDE 25 MG  TABS (HYDROCHLOROTHIAZIDE) Take 1 tablet by mouth once a day  #30 x 6   Entered and Authorized by:   Kerby Nora MD   Signed by:   Kerby Nora MD on 08/06/2007   Method used:   [facility] ...       [facility]  [address]  [id number]       [address]       [place], [place]  [address]       Ph: [phone] or [phone]       Fax: [phone]   RxID:   [id number] METOPROLOL SUCCINATE 50 MG  TB24 (METOPROLOL SUCCINATE) Take 1 tablet by mouth once a day  #30 x 6   Entered and Authorized by:   Kerby Nora MD   Signed by:   Kerby Nora MD on 08/06/2007   Method used:   Electronically sent to ...       [facility]  [address]  [id number]       [address]       [place], [place]  [address]       Ph: [phone] or [phone]       Fax: [phone]   RxID:   [id number]  ]  Tetanus/Td Immunization History:    Tetanus/Td # 1:  Historical (10/20/1998)  Influenza Immunization History:    Influenza # 1:  Historical (07/21/2007)  Pneumovax Immunization History:    Pneumovax # 1:  Historical (10/20/1998)

## 2010-11-20 NOTE — Letter (Signed)
Summary: MEDICAL RELEASE FORM  MEDICAL RELEASE FORM   Imported By: Carrie Mew 08/12/2007 15:14:42  _____________________________________________________________________  External Attachment:    Type:   Image     Comment:   External Document

## 2010-11-20 NOTE — Procedures (Signed)
Summary: Upper Endoscopy  Patient: [name] Note: All result statuses are Final unless otherwise noted.  Tests: (1) Upper Endoscopy (EGD)   EGD Upper Endoscopy       DONE     [facility]     [address]     [place], [place]  [address]           ENDOSCOPY PROCEDURE REPORT           PATIENT:  [name], [name]  MR#:  [id number]     BIRTHDATE:  22-May-1925, 84 yrs. old  GENDER:  female           ENDOSCOPIST:  Iva Boop, MD, [facility]     Referred by:           PROCEDURE DATE:  12/26/2009     PROCEDURE:  EGD with biopsy     ASA CLASS:  Class II     INDICATIONS:  epigastric pain, chest pain (on PPI)           MEDICATIONS:   Fentanyl 25 mcg IV, Versed 3 mg IV     TOPICAL ANESTHETIC:  Exactacain Spray           DESCRIPTION OF PROCEDURE:   After the risks benefits and     alternatives of the procedure were thoroughly explained, informed     consent was obtained.  The [facility] [id number] endoscope was     introduced through the mouth and advanced to the second portion of     the duodenum, without limitations.  The instrument was slowly     withdrawn as the mucosa was fully examined.     <<PROCEDUREIMAGES>>           Moderate gastritis was found in the antrum. Erythema, superficial     erosions (few), mottled mucosa. Multiple biopsies were obtained     and sent to pathology.  Otherwise the examination was normal.     Retroflexed views revealed no abnormalities.    The scope was then     withdrawn from the patient and the procedure completed.           COMPLICATIONS:  None           ENDOSCOPIC IMPRESSION:     1) Moderate gastritis in the antrum - biopsied     2) Otherwise normal examination     RECOMMENDATIONS:     1) continue current medications     2) Await biopsy results     ? if antispasmodic indicated - will notify her after biopsies     reviewed           REPEAT EXAM:  In for as needed.           Iva Boop, MD, [facility]           CC:  Excell Seltzer, MD,  The Patient           n.     Rosalie Doctor:   Iva Boop at 12/26/2009 12:30 PM           Iva Boop, [id number]  Note: An exclamation mark (!) indicates a result that was not dispersed into the flowsheet. Document Creation Date: 12/26/2009 12:30 PM _______________________________________________________________________  (1) Order result status: Final Collection or observation date-time: 12/26/2009 12:22 Requested date-time:  Receipt date-time:  Reported date-time:  Referring Physician:   Ordering Physician: Stan Head [phone]) Specimen Source:  Source: Launa Grill Order Number: [id number] Lab site:

## 2010-11-20 NOTE — Assessment & Plan Note (Signed)
Summary: ROA 3 MTHS    CYD   Vital Signs:  Patient profile:   77 year old female Height:      63 inches Weight:      161.50 pounds BMI:     28.71 Temp:     98.2 degrees F oral Pulse rate:   84 / minute Pulse rhythm:   regular BP sitting:   142 / 80  (left arm) Cuff size:   regular  Vitals Entered By: Delilah Shan CMA Duncan Dull) (June 15, 2009 10:56 AM) CC: 3 months follow up, Hypertension Management   History of Present Illness: Had knee surgery in last few weeks...gradually resolving, no sign of infection.   Ever since I have been seing her she has complained about pain over ziphoid.  Always comments about swelling in that area..fat deposition is all I have been able to see.   Has intermittant pain, ache 3/10   daily to several times a day there, feel better with pressure. "I am worried there is cancer" No relation to meals. Aleve helps some.  Has GI and cardiac work up...has erosive esophagititis     Hypertension History:      She denies headache, chest pain, palpitations, dyspnea with exertion, peripheral edema, neurologic problems, syncope, and side effects from treatment.  BPs at home...11/64, 126/56 HR 58-79, usually pulse around 61. No lightheadedness, .        Positive major cardiovascular risk factors include female age 21 years old or older, hyperlipidemia, and hypertension.  Negative major cardiovascular risk factors include no history of diabetes, negative family history for ischemic heart disease, and non-tobacco-user status.        Further assessment for target organ damage reveals no history of ASHD, cardiac end-organ damage (CHF/LVH), stroke/TIA, peripheral vascular disease, renal insufficiency, or hypertensive retinopathy.     Problems Prior to Update: 1)  Need Prophylactic Vaccination&inoculation Flu  (ICD-V04.81) 2)  Prediabetes  (ICD-790.29) 3)  Generalized Osteoarthrosis Unspecified Site  (ICD-715.00) 4)  Neoplasm, Skin, Uncertain Behavior  (ICD-238.2)  5)  Pud  (ICD-533.90) 6)  Erosive Esophagitis  (ICD-530.19) 7)  Early Satiety  (ICD-780.94) 8)  Epigastric Pain  (ICD-789.06) 9)  Special Screening Malig Neoplasms Other Sites  (ICD-V76.49) 10)  Osteoarthritis  (ICD-715.90) 11)  Gerd  (ICD-530.81) 12)  Hyperlipidemia  (ICD-272.4) 13)  Symptom, Malaise and Fatigue Nec  (ICD-780.79) 14)  Hypertension, Benign  (ICD-401.1) 15)  Chest Pain, Atypical  (ICD-786.59)  Current Medications (verified): 1)  Multivitamins   Tabs (Multiple Vitamin) .... Once Daily 2)  Caltrate 600+d 600-400 Mg-Unit  Tabs (Calcium Carbonate-Vitamin D) .Marland Kitchen.. 1 By Mouth Two Times A Day 3)  Tylenol 325 Mg  Tabs (Acetaminophen) .... As Needed 4)  Lisinopril-Hydrochlorothiazide 20-25 Mg Tabs (Lisinopril-Hydrochlorothiazide) .Marland Kitchen.. 1 Tab By Mouth Daily 5)  Metoprolol Tartrate 50 Mg  Tabs (Metoprolol Tartrate) .... Take 1 Tab Two Times A Day 6)  Metoprolol Tartrate 25 Mg  Tabs (Metoprolol Tartrate) .Marland Kitchen.. 1 Tab By Mouth Two Times A Day Along With 50 Mg Tablet 7)  Aspirin 81 Mg  Tabs (Aspirin) .... Take 1 Tablet By Mouth Once A Day 8)  Omeprazole 40 Mg Cpdr (Omeprazole) .... Take 1 Tablet By Mouth Once A Day 9)  Glucosamine Sulfate 500 Mg Caps (Glucosamine Sulfate)  Allergies (verified): No Known Drug Allergies  Past History:  Past medical, surgical, family and social histories (including risk factors) reviewed, and no changes noted (except as noted below).  Past Medical History: Reviewed history from 12/23/2007 and  no changes required. OSTEOARTHRITIS (ICD-715.90) GERD (ICD-530.81) HYPERLIPIDEMIA (ICD-272.4) SYMPTOM, MALAISE AND FATIGUE NEC (ICD-780.79) HYPERTENSION, BENIGN (ICD-401.1) CHEST PAIN, ATYPICAL (ICD-786.59) GASTRIC ULCER  Past Surgical History: left knee replacement Charlann Boxer) Appendectomy shoulder dislocation right shoulder (Supple) 10/08 nuclear Cardiolyte neg 05/2009 knee surgery to cut nerves  Family History: Reviewed history from 08/06/2007 and no  changes required. father died age 59 CAD mother died age 18 old age 32 sisters: HTN no cancer MI < age 37  Social History: Reviewed history from 08/06/2007 and no changes required. 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 CV:  Complains of chest pain or discomfort; denies fainting, near fainting, and swelling of feet. Resp:  Denies shortness of breath, sputum productive, and wheezing. GI:  Denies abdominal pain, bloody stools, constipation, diarrhea, gas, and indigestion.  Physical Exam  General:  elderly female in NAD Ears:  External ear exam shows no significant lesions or deformities.  Otoscopic examination reveals clear canals, tympanic membranes are intact bilaterally without bulging, retraction, inflammation or discharge. Hearing is grossly normal bilaterally. Nose:  External nasal examination shows no deformity or inflammation. Nasal mucosa are pink and moist without lesions or exudates. Mouth:  Oral mucosa and oropharynx without lesions or exudates.  Teeth in good repair. Neck:  no carotid bruit or thyromegaly no cervical or supraclavicular lymphadenopathy  Chest Wall:  over Xiphoid pouch of soft tissue, most apparent when sitting up...on deep palpation tender over sternum...no clear mass, or bone lesion ? soft olong prominence..? cyst  Lungs:  Normal respiratory effort, chest expands symmetrically. Lungs are clear to auscultation, no crackles or wheezes. Heart:  Normal rate and regular rhythm. S1 and S2 normal without gallop, murmur, click, rub or other extra sounds. Abdomen:  Bowel sounds positive,abdomen soft and non-tender without masses, organomegaly or hernias noted.   Impression & Recommendations:  Problem # 1:  SWELLING, MASS, OR LUMP IN CHEST (ICD-786.6) Has been present for over 1-2 years..she  feels like it is more tender and increasing in size. Will eval with CT chest to look for bone lesion/subcutaneous cyst/mass. No red flags.  Orders: Radiology Referral (Radiology)  Problem # 2:  PREDIABETES (ICD-790.29)  IMproved .   Labs Reviewed: Creat: 1.0 (06/12/2009)     Problem # 3:  EROSIVE ESOPHAGITIS (ICD-530.19) HAs been taking NSAIDs...stop could be causing epigastric pain that pt describes as over xiphoid. Consider return to GI.   Problem # 4:  HYPERLIPIDEMIA (ICD-272.4) Improved control.   Problem # 5:  HYPERTENSION, BENIGN (ICD-401.1)  Well controlled at home.  Some slight brady with BBlocker.  Her updated medication list for this problem includes:    Lisinopril-hydrochlorothiazide 20-25 Mg Tabs (Lisinopril-hydrochlorothiazide) .Marland Kitchen... 1 tab by mouth daily    Metoprolol Tartrate 50 Mg Tabs (Metoprolol tartrate) .Marland Kitchen... Take 1 tab two times a day    Metoprolol Tartrate 25 Mg Tabs (Metoprolol tartrate) .Marland Kitchen... 1 tab by mouth two times a day along with 50 mg tablet  BP today: 142/80 Prior BP: 142/66 (03/12/2009)  10 Yr Risk Heart Disease: 27 % Prior 10 Yr Risk Heart Disease: Not enough information (03/12/2009)  Labs Reviewed: K+: 4.8 (06/12/2009) Creat: : 1.0 (06/12/2009)   Chol: 190 (06/12/2009)   HDL: 33.60 (06/12/2009)   LDL: 128 (06/12/2009)   TG: 143.0 (06/12/2009)  Complete Medication List: 1)  Multivitamins Tabs (Multiple vitamin) .... Once daily 2)  Caltrate 600+d  600-400 Mg-unit Tabs (Calcium carbonate-vitamin d) .Marland Kitchen.. 1 by mouth two times a day 3)  Tylenol 325 Mg Tabs (Acetaminophen) .... As needed 4)  Lisinopril-hydrochlorothiazide 20-25 Mg Tabs (Lisinopril-hydrochlorothiazide) .Marland Kitchen.. 1 tab by mouth daily 5)  Metoprolol Tartrate 50 Mg Tabs (Metoprolol tartrate) .... Take 1 tab two times a day 6)  Metoprolol Tartrate 25 Mg Tabs (Metoprolol tartrate) .Marland Kitchen.. 1 tab by mouth two times a day along with 50 mg tablet 7)  Aspirin 81 Mg Tabs (Aspirin) .... Take 1 tablet by  mouth once a day 8)  Omeprazole 40 Mg Cpdr (Omeprazole) .... Take 1 tablet by mouth once a day 9)  Glucosamine Sulfate 500 Mg Caps (Glucosamine sulfate)  Hypertension Assessment/Plan:      The patient's hypertensive risk group is category B: At least one risk factor (excluding diabetes) with no target organ damage.  Her calculated 10 year risk of coronary heart disease is 27 %.  Today's blood pressure is 142/80.  Her blood pressure goal is < 140/90.  Patient Instructions: 1)  Stop aleve...use tyelenol instead.  2)  Stop meloxicam.  3)  Referral Appointment Information 4)  Day/Date: 5)  Time: 6)  Place/MD: 7)  Address: 8)  Phone/Fax: 9)  Patient given appointment information. Information/Orders faxed/mailed.  10)  Please schedule a follow-up appointment in 3 months  30 min .  Current Allergies (reviewed today): No known allergies

## 2010-11-20 NOTE — Assessment & Plan Note (Signed)
Summary: 1 MO. F/U/BIR   Vital Signs:  Patient Profile:   77 Years Old Female Height:     63 inches Weight:      162.50 pounds Temp:     97.8 degrees F oral Pulse rate:   68 / minute Pulse rhythm:   regular BP sitting:   144 / 72  (left arm) Cuff size:   regular  Vitals Entered By: Delilah Shan (December 11, 2008 11:04 AM)                 Chief Complaint:  1 month follow up.  Hypertension History:      She denies headache, chest pain, palpitations, dyspnea with exertion, peripheral edema, visual symptoms, syncope, and side effects from treatment.  Further comments include: not checking at home. Marland Kitchen        Positive major cardiovascular risk factors include female age 2 years old or older, hyperlipidemia, and hypertension.  Negative major cardiovascular risk factors include no history of diabetes, negative family history for ischemic heart disease, and non-tobacco-user status.        Further assessment for target organ damage reveals no history of ASHD, cardiac end-organ damage (CHF/LVH), stroke/TIA, peripheral vascular disease, renal insufficiency, or hypertensive retinopathy.       Current Allergies (reviewed today): No known allergies   Past Medical History:    Reviewed history from 12/23/2007 and no changes required:       OSTEOARTHRITIS (ICD-715.90)       GERD (ICD-530.81)       HYPERLIPIDEMIA (ICD-272.4)       SYMPTOM, MALAISE AND FATIGUE NEC (ICD-780.79)       HYPERTENSION, BENIGN (ICD-401.1)       CHEST PAIN, ATYPICAL (ICD-786.59)       GASTRIC ULCER     Review of Systems      See HPI   Physical Exam  General:     Elderly female overweight in NAD Mouth:     Oral mucosa and oropharynx without lesions or exudates.  Teeth in good repair. Neck:     no carotid bruit or thyromegaly no cervical or supraclavicular lymphadenopathy  Lungs:     Normal respiratory effort, chest expands symmetrically. Lungs are clear to auscultation, no crackles or wheezes.  Heart:     Normal rate and regular rhythm. S1 and S2 normal without gallop, murmur, click, rub or other extra sounds. Pulses:     R and L posterior tibial pulses are full and equal bilaterally  Extremities:     no edema    Impression & Recommendations:  Problem # 1:  HYPERTENSION, BENIGN (ICD-401.1) Unclear control. Follow at home. likely need to increase lisinopril fiurhter. Pt will get home cuff and call with measurements.  Her updated medication list for this problem includes:    Lisinopril-hydrochlorothiazide 10-12.5 Mg Tabs (Lisinopril-hydrochlorothiazide) .Marland Kitchen... Take 1 tablet by mouth once a day    Metoprolol Tartrate 50 Mg Tabs (Metoprolol tartrate) .Marland Kitchen... Take 1 tab two times a day    Metoprolol Tartrate 25 Mg Tabs (Metoprolol tartrate) .Marland Kitchen... 1 tab by mouth two times a day along with 50 mg tablet   Complete Medication List: 1)  Multivitamins Tabs (Multiple vitamin) .... Once daily 2)  Caltrate 600+d 600-400 Mg-unit Tabs (Calcium carbonate-vitamin d) .Marland Kitchen.. 1 by mouth two times a day 3)  Tylenol 325 Mg Tabs (Acetaminophen) .... As needed 4)  Lisinopril-hydrochlorothiazide 10-12.5 Mg Tabs (Lisinopril-hydrochlorothiazide) .... Take 1 tablet by mouth once a day 5)  Metoprolol Tartrate  50 Mg Tabs (Metoprolol tartrate) .... Take 1 tab two times a day 6)  Meloxicam 7.5 Mg Tabs (Meloxicam) .... Take 1 tablet by mouth once a day  as needed 7)  Metoprolol Tartrate 25 Mg Tabs (Metoprolol tartrate) .Marland Kitchen.. 1 tab by mouth two times a day along with 50 mg tablet 8)  Aspirin 81 Mg Tabs (Aspirin) .... Take 1 tablet by mouth once a day 9)  Omeprazole 40 Mg Cpdr (Omeprazole) .... Take 1 tablet by mouth once a day 10)  Glucosamine Sulfate 500 Mg Caps (Glucosamine sulfate) 11)  Aleve 220 Mg Tabs (Naproxen sodium) .... As needed  Hypertension Assessment/Plan:      The patient's hypertensive risk group is category B: At least one risk factor (excluding diabetes) with no target organ damage.  Her  calculated 10 year risk of coronary heart disease is 17 %.  Today's blood pressure is 144/72.  Her blood pressure goal is < 140/90.   Patient Instructions: 1)  Call with BP measurements in 1-2 weeks to determine if BP running high at home.  2)  Please schedule a follow-up appointment in 3 months BP.     Current Allergies (reviewed today): No known allergies  Current Medications (including changes made in today's visit):  MULTIVITAMINS   TABS (MULTIPLE VITAMIN) once daily CALTRATE 600+D 600-400 MG-UNIT  TABS (CALCIUM CARBONATE-VITAMIN D) 1 by mouth two times a day TYLENOL 325 MG  TABS (ACETAMINOPHEN) as needed LISINOPRIL-HYDROCHLOROTHIAZIDE 10-12.5 MG TABS (LISINOPRIL-HYDROCHLOROTHIAZIDE) Take 1 tablet by mouth once a day METOPROLOL TARTRATE 50 MG  TABS (METOPROLOL TARTRATE) Take 1 tab two times a day MELOXICAM 7.5 MG  TABS (MELOXICAM) Take 1 tablet by mouth once a day  as needed METOPROLOL TARTRATE 25 MG  TABS (METOPROLOL TARTRATE) 1 tab by mouth two times a day along with 50 mg tablet ASPIRIN 81 MG  TABS (ASPIRIN) Take 1 tablet by mouth once a day OMEPRAZOLE 40 MG CPDR (OMEPRAZOLE) Take 1 tablet by mouth once a day GLUCOSAMINE SULFATE 500 MG CAPS (GLUCOSAMINE SULFATE)  ALEVE 220 MG TABS (NAPROXEN SODIUM) as needed

## 2010-11-20 NOTE — Progress Notes (Signed)
   Phone Note Call from Patient   Summary of Call: Check with pharmacy and they said rx. is ready to be picked up so she will get it today.Given rov appt. Initial call taken by: Teryl Lucy RN,  January 02, 2010 10:33 AM

## 2010-11-20 NOTE — Procedures (Signed)
Summary: Incomplete Colonoscopy by Dr.Gessner  Incomplete Colonoscopy by Dr.Gessner   Imported By: Beau Fanny 01/11/2008 14:24:40  _____________________________________________________________________  External Attachment:    Type:   Image     Comment:   External Document

## 2010-11-20 NOTE — Letter (Signed)
Summary: Results Follow up Letter   at [facility]  [address] [place], [place] [address]   Phone: [phone]  Fax: [phone]    09/08/2007 MRN: [id number]  [name] [address] [place], [place]  [address]  Dear [name],  The following are the results of your recent test(s):  Test         Result    Pap Smear:        Normal _____  Not Normal _____ Comments: ______________________________________________________ Cholesterol: LDL(Bad cholesterol):         Your goal is less than:         HDL (Good cholesterol):       Your goal is more than: Comments:  ______________________________________________________ Mammogram:        Normal _____  Not Normal _____ Comments:  ___________________________________________________________________ Hemoccult:        Normal __X___  Not normal _______ Comments:  Thank you for returning your stool cards.  Yearly follow up is recommended.   _____________________________________________________________________ Other Tests:    We routinely do not discuss normal results over the telephone.  If you desire a copy of the results, or you have any questions about this information we can discuss them at your next office visit.   Sincerely,    Excell Seltzer, M.D.  AEB:lsf

## 2010-11-20 NOTE — Assessment & Plan Note (Signed)
Summary: EXTERNAL CHEST DISCOMFORT/HEA   Vital Signs:  Patient Profile:   77 Years Old Female Weight:      171.38 pounds Temp:     98.5 degrees F oral Pulse rate:   84 / minute Pulse rhythm:   regular BP sitting:   184 / 100  (left arm) Cuff size:   regular  Vitals Entered By: Delilah Shan (July 30, 2007 12:53 PM)                 Chief Complaint:  Chest discomfort.  History of Present Illness: swelling in central chest x 2-3weeks, gradually worsening, radiates up towrds neck intermittant pain, lasting hours NO CURRENT CHEST PAIN improved with pain medications, vicodin present at rest, spontaneous feels full early, no pain with eating, no heartburn when pain is there it hurts to palpate no pain with lying flat some SOB with exertion no cough, fever, N/V, diaphoresis  no history of CAD had recent nml mamogram  ? last chol/DM check  no history of HTN BP has been high at gynecologist  Current Allergies: No known allergies   Past Medical History:    Hypertension  Past Surgical History:    left knee replacement    Appendectomy    Risk Factors:  Mammogram History:     Date of Last Mammogram:  05/07/2007    Results:  Normal Bilateral    Review of Systems      See HPI   Physical Exam  General:     elderly female in NAD Chest Wall:     No deformities, masses, or tenderness noted. Lungs:     Normal respiratory effort, chest expands symmetrically. Lungs are clear to auscultation, no crackles or wheezes. Heart:     Normal rate and regular rhythm. S1 and S2 normal without gallop, murmur, click, rub or other extra sounds. Abdomen:     Bowel sounds positive,abdomen soft and non-tender without masses, organomegaly or hernias noted. Skin:     Intact without suspicious lesions or rashes Psych:     Cognition and judgment appear intact. Alert and cooperative with normal attention span and concentration. No apparent delusions, illusions, hallucinations     Impression & Recommendations:  Problem # 1:  CHEST PAIN, ATYPICAL (ICD-786.59) ? etiology.  Eval for cardiac origin with cardiolyte. Start baby asa.  Start below BP med. May also be MSK vs. GERD in origin.  Will have her f/up after cardiolyte next week. Orders: Cardiology Other (Cardiology Other)   Problem # 2:  HYPERTENSION, BENIGN (ICD-401.1) Consider labs for secondary risk factors at next appointment.  Start BBlocker, may need increase. Her updated medication list for this problem includes:    Metoprolol Succinate 25 Mg Tb24 (Metoprolol succinate) .Marland Kitchen... Take 1 tablet by mouth once a day   Complete Medication List: 1)  Multivitamins Tabs (Multiple vitamin) .... Once daily 2)  Caltrate 600+d 600-400 Mg-unit Tabs (Calcium carbonate-vitamin d) .Marland Kitchen.. 1 by mouth two times a day 3)  Aleve 220 Mg Tabs (Naproxen sodium) .... Daily as needed 4)  Tylenol 325 Mg Tabs (Acetaminophen) .... As needed 5)  Metoprolol Succinate 25 Mg Tb24 (Metoprolol succinate) .... Take 1 tablet by mouth once a day   Patient Instructions: 1)  Referral Appointment Information stress cardiolyte early next week 2)  Day/Date: 3)  Time: 4)  Place/MD: 5)  Address: 6)  Phone/Fax: 7)  Patient given appointment information. Information/Orders faxed/mailed. 8)  Start baby aspirin daily. 9)  Start BP med metoprolol 10)  F/U appointment in 1 week 30 min appt.    Prescriptions: METOPROLOL SUCCINATE 25 MG  TB24 (METOPROLOL SUCCINATE) Take 1 tablet by mouth once a day  #30 x 6   Entered and Authorized by:   Kerby Nora MD   Signed by:   Kerby Nora MD on 07/30/2007   Method used:   Print then Give to Patient   RxID:   [id number]  ] Current Allergies: No known allergies  Current Medications (including changes made in today's visit):  MULTIVITAMINS   TABS (MULTIPLE VITAMIN) once daily CALTRATE 600+D 600-400 MG-UNIT  TABS (CALCIUM CARBONATE-VITAMIN D) 1 by mouth two times a day ALEVE 220 MG  TABS  (NAPROXEN SODIUM) Daily as needed TYLENOL 325 MG  TABS (ACETAMINOPHEN) as needed METOPROLOL SUCCINATE 25 MG  TB24 (METOPROLOL SUCCINATE) Take 1 tablet by mouth once a day   Appended Document: EXTERNAL CHEST DISCOMFORT/HEA    Clinical Lists Changes  Observations: Added new observation of EKG INTERP: NSR 83 bpm, Q waves in III, aVF, V1, v3, no ST changes no EKG for comparison. (07/30/2007 17:16)        EKG  Procedure date:  07/30/2007  Findings:      NSR 83 bpm, Q waves in III, aVF, V1, v3, no ST changes no EKG for comparison.

## 2010-11-20 NOTE — Procedures (Signed)
Summary: EGD w/Biopsy: Gastric ulcer, gastritis, GERD  EGD w/Biopsy   Imported By: Beau Fanny 11/12/2007 13:24:09  _____________________________________________________________________  External Attachment:    Type:   Image     Comment:   External Document  Appended Document: EGD w/Biopsy    Clinical Lists Changes  Problems: Added new problem of EROSIVE ESOPHAGITIS (ICD-530.19) Added new problem of PUD (ICD-533.90) - pyloric ulcer on EGD 1/09

## 2010-11-20 NOTE — Letter (Signed)
Summary: Blood Pressure readins from 12/15/08-12/27/08  Blood Pressure readins from 12/15/08-12/27/08   Imported By: Beau Fanny 01/02/2009 09:28:04  _____________________________________________________________________  External Attachment:    Type:   Image     Comment:   External Document

## 2010-11-20 NOTE — Progress Notes (Signed)
Summary: BP readings  Phone Note Call from Patient Call back at Home Phone [phone]   Caller: Patient Call For: Kerby Nora MD Summary of Call: Pt brought in a list of her BP readings, list is on  your desk. Initial call taken by: Lowella Petties,  December 29, 2008 1:03 PM  Follow-up for Phone Call        Increase lisinopril HCTZ to 2 tabs by mouth daily (once out of medication, we can refill at higher dose so that she can go back to taking 1 tab daily 20/25 mg. Have her come in for BMET in 7-10 days Dx 401.1 Bring in BP measurements and pulse at that time.  Follow-up by: Kerby Nora MD,  January 01, 2009 11:46 AM  Additional Follow-up for Phone Call Additional follow up Details #1::        Patient Advised. Lab appointment scheduled:  01/17/2009.  Also reminded to bring in BP readings at lab appt.     Additional Follow-up by: Delilah Shan,  January 01, 2009 1:24 PM    New/Updated Medications: LISINOPRIL-HYDROCHLOROTHIAZIDE 10-12.5 MG TABS (LISINOPRIL-HYDROCHLOROTHIAZIDE) Take 2 tablet by mouth once a day   Appended Document: BP readings Please send new strength of Rx. to pharmacy, [facility] [place].  Appended Document: Orders Update Medications Added LISINOPRIL-HYDROCHLOROTHIAZIDE 20-25 MG TABS (LISINOPRIL-HYDROCHLOROTHIAZIDE) 1 tab by mouth daily          Clinical Lists Changes  Medications: Changed medication from LISINOPRIL-HYDROCHLOROTHIAZIDE 10-12.5 MG TABS (LISINOPRIL-HYDROCHLOROTHIAZIDE) Take 2 tablet by mouth once a day to LISINOPRIL-HYDROCHLOROTHIAZIDE 20-25 MG TABS (LISINOPRIL-HYDROCHLOROTHIAZIDE) 1 tab by mouth daily - Signed Rx of LISINOPRIL-HYDROCHLOROTHIAZIDE 20-25 MG TABS (LISINOPRIL-HYDROCHLOROTHIAZIDE) 1 tab by mouth daily;  #30 x 5;  Signed;  Entered by: Kerby Nora MD;  Authorized by: Kerby Nora MD;  Method used: Electronically to [facility]  [place] [place], [place]  [address], Ph: [phone] or [phone], Fax: [phone]     Prescriptions: LISINOPRIL-HYDROCHLOROTHIAZIDE 20-25 MG TABS (LISINOPRIL-HYDROCHLOROTHIAZIDE) 1 tab by mouth daily  #30 x 5   Entered and Authorized by:   Kerby Nora MD   Signed by:   Kerby Nora MD on 01/01/2009   Method used:   Electronically to        [facility]  [place] (retail)       [address]       [place], [place]  [address]       Ph: [phone] or [phone]       Fax: [phone]   RxID:   [id number]

## 2010-11-20 NOTE — Assessment & Plan Note (Signed)
Summary: shave biopsy left arm/rbh   Vital Signs:  Patient Profile:   77 Years Old Female Height:     63 inches Weight:      166 pounds Temp:     97.8 degrees F oral Pulse rate:   84 / minute Pulse rhythm:   regular BP sitting:   170 / 80  (left arm) Cuff size:   regular  Vitals Entered By: Delilah Shan (March 27, 2008 3:18 PM)                 Procedure Note Last Tetanus: Historical (10/20/1998)  Biopsy: Biopsy Type: Skin Indication: inflamed lesion  Procedure # 1: shave biopsy    Size (in cm): 0.3 x 0.3    Region: posterior    Location: left upper arm    Anesthesia: 2% lidocaine w/epinephrine  Cleaned and prepped with: betadine Wound dressing: bacitracin Instructions: daily dressing changes   Chief Complaint:  Shave biopsy.    Current Allergies (reviewed today): No known allergies         Impression & Recommendations:  Problem # 1:  NEOPLASM, SKIN, UNCERTAIN BEHAVIOR (ICD-238.2) Appears most consistent with seb keratosis , cutaneous horn or basal/squamous cell. Send to path. Orders: Shave Skin Lesion < 0.5 cm/trunk/arm/leg ([address])   Complete Medication List: 1)  Multivitamins Tabs (Multiple vitamin) .... Once daily 2)  Caltrate 600+d 600-400 Mg-unit Tabs (Calcium carbonate-vitamin d) .Marland Kitchen.. 1 by mouth two times a day 3)  Aleve 220 Mg Tabs (Naproxen sodium) .... Daily as needed 4)  Tylenol 325 Mg Tabs (Acetaminophen) .... As needed 5)  Hydrochlorothiazide 25 Mg Tabs (Hydrochlorothiazide) .... Take 1 tablet by mouth once a day 6)  Metoprolol Tartrate 50 Mg Tabs (Metoprolol tartrate) .... Take 1 tablet by mouth two times a day    ] Current Allergies (reviewed today): No known allergies  Current Medications (including changes made in today's visit):  MULTIVITAMINS   TABS (MULTIPLE VITAMIN) once daily CALTRATE 600+D 600-400 MG-UNIT  TABS (CALCIUM CARBONATE-VITAMIN D) 1 by mouth two times a day ALEVE 220 MG  TABS (NAPROXEN SODIUM) Daily as needed  TYLENOL 325 MG  TABS (ACETAMINOPHEN) as needed HYDROCHLOROTHIAZIDE 25 MG  TABS (HYDROCHLOROTHIAZIDE) Take 1 tablet by mouth once a day METOPROLOL TARTRATE 50 MG  TABS (METOPROLOL TARTRATE) Take 1 tablet by mouth two times a day

## 2010-11-20 NOTE — Assessment & Plan Note (Signed)
Summary: 6  WEEK FOLLOWUP/RBH   Vital Signs:  Patient Profile:   77 Years Old Female Weight:      165.50 pounds Temp:     97.5 degrees F oral Pulse rate:   76 / minute Pulse rhythm:   regular BP sitting:   130 / 80  (left arm) Cuff size:   regular  Vitals Entered ByMarland Kitchen Delilah Shan (October 06, 2007 2:47 PM)                 Chief Complaint:  6 week follow up.  History of Present Illness: epigastric pain/ache continues despite prilosec (hasn't helped much) intermittant, lasts hours no change with eating no burping, gas, feels fullness in area/swelling early satiety no GERD, no throat burning no nausea, no vomiting BM regular every other day  occurs at different times of day awakens her at night   neg Cardiolyte see prev OV   Current Allergies (reviewed today): No known allergies   Past Medical History:    Reviewed history from 08/20/2007 and no changes required:       Current Problems:        OSTEOARTHRITIS (ICD-715.90)       GERD (ICD-530.81)       HYPERLIPIDEMIA (ICD-272.4)       SYMPTOM, MALAISE AND FATIGUE NEC (ICD-780.79)       HYPERTENSION, BENIGN (ICD-401.1)       CHEST PAIN, ATYPICAL (ICD-786.59)          Past Surgical History:    Reviewed history from 08/06/2007 and no changes required:       left knee replacement       Appendectomy       shoulder dislocation right shoulder       10/08 nuclear Cardiolyte neg     Review of Systems  The patient denies anorexia, fever, weight loss, melena, hematochezia, and severe indigestion/heartburn.         no night sweats   Physical Exam  General:     overweight-appearing, elderly in NAD Lungs:     Normal respiratory effort, chest expands symmetrically. Lungs are clear to auscultation, no crackles or wheezes. Heart:     Normal rate and regular rhythm. S1 and S2 normal without gallop, murmur, click, rub or other extra sounds. Abdomen:     mild  upper epigastrum, fatty tissue accumulation that  she feels is swelling, no rebound , no guardingno abdominal hernia, no hepatomegaly, and no splenomegaly.      Impression & Recommendations:  Problem # 1:  EPIGASTRIC PAIN (ICD-789.06) neg cardiac work up.  Some ? early satiety but no real true association with food. ? gastritis , hiatal hernia, etc.  No improvement on prilosec. Will try samples of aciphex and refer to GI for furthter work up.  Orders: Gastroenterology Referral (GI)   Problem # 2:  HYPERTENSION, BENIGN (ICD-401.1) much better controlled on current medication regimen. Her updated medication list for this problem includes:    Metoprolol Succinate 100 Mg Tb24 (Metoprolol succinate) .Marland Kitchen... Take 1 tablet by mouth once a day    Hydrochlorothiazide 25 Mg Tabs (Hydrochlorothiazide) .Marland Kitchen... Take 1 tablet by mouth once a day   Complete Medication List: 1)  Multivitamins Tabs (Multiple vitamin) .... Once daily 2)  Caltrate 600+d 600-400 Mg-unit Tabs (Calcium carbonate-vitamin d) .Marland Kitchen.. 1 by mouth two times a day 3)  Aleve 220 Mg Tabs (Naproxen sodium) .... Daily as needed 4)  Tylenol 325 Mg Tabs (Acetaminophen) .... As needed 5)  Metoprolol Succinate 100 Mg Tb24 (Metoprolol succinate) .... Take 1 tablet by mouth once a day 6)  Hydrochlorothiazide 25 Mg Tabs (Hydrochlorothiazide) .... Take 1 tablet by mouth once a day   Patient Instructions: 1)  Referral Appointment Information  GI referral for epigastric pain 2)  Day/Date: 3)  Time: 4)  Place/MD: 5)  Address: 6)  Phone/Fax: 7)  Patient given appointment information. Information/Orders faxed/mailed.     ] Current Allergies (reviewed today): No known allergies  Current Medications (including changes made in today's visit):  MULTIVITAMINS   TABS (MULTIPLE VITAMIN) once daily CALTRATE 600+D 600-400 MG-UNIT  TABS (CALCIUM CARBONATE-VITAMIN D) 1 by mouth two times a day ALEVE 220 MG  TABS (NAPROXEN SODIUM) Daily as needed TYLENOL 325 MG  TABS (ACETAMINOPHEN) as needed  METOPROLOL SUCCINATE 100 MG  TB24 (METOPROLOL SUCCINATE) Take 1 tablet by mouth once a day HYDROCHLOROTHIAZIDE 25 MG  TABS (HYDROCHLOROTHIAZIDE) Take 1 tablet by mouth once a day

## 2010-11-20 NOTE — Miscellaneous (Signed)
  Medications Added ZOSTAVAX [address] UNT/0.65ML SOLR (ZOSTER VACCINE LIVE) gave verbal order to casey at [facility] for her to recieve this vaccine       Clinical Lists Changes  Medications: Added new medication of ZOSTAVAX 60454 UNT/0.65ML SOLR (ZOSTER VACCINE LIVE) gave verbal order to casey at [facility] for her to recieve this vaccine

## 2010-11-20 NOTE — Letter (Signed)
Summary: Medicare/Medicaid Letter-[facility]  Medicare/Medicaid Letter-[facility]aging [facility]   Imported By: Maryln Gottron 06/27/2009 15:06:20  _____________________________________________________________________  External Attachment:    Type:   Image     Comment:   External Document

## 2010-11-20 NOTE — Assessment & Plan Note (Signed)
Summary: 3 MONTH FOLLOW UP BP/RBH   Vital Signs:  Patient profile:   77 year old female Height:      63 inches Weight:      159.50 pounds BMI:     28.36 Temp:     97.6 degrees F oral Pulse rate:   64 / minute Pulse rhythm:   regular BP sitting:   142 / 66  (left arm) Cuff size:   regular  Vitals Entered By: Delilah Shan (Mar 12, 2009 11:13 AM) CC: 3 months follow up, Hypertension Management   History of Present Illness: 3 lb weigfht loss..working on diet and exercsie.  Hypertension History:      She denies headache, chest pain, palpitations, dyspnea with exertion, peripheral edema, syncope, and side effects from treatment.  110/61-131/61 HR 64.        Positive major cardiovascular risk factors include female age 109 years old or older, hyperlipidemia, and hypertension.  Negative major cardiovascular risk factors include no history of diabetes, negative family history for ischemic heart disease, and non-tobacco-user status.        Further assessment for target organ damage reveals no history of ASHD, cardiac end-organ damage (CHF/LVH), stroke/TIA, peripheral vascular disease, renal insufficiency, or hypertensive retinopathy.     Problems Prior to Update: 1)  Need Prophylactic Vaccination&inoculation Flu  (ICD-V04.81) 2)  Prediabetes  (ICD-790.29) 3)  Generalized Osteoarthrosis Unspecified Site  (ICD-715.00) 4)  Neoplasm, Skin, Uncertain Behavior  (ICD-238.2) 5)  Pud  (ICD-533.90) 6)  Erosive Esophagitis  (ICD-530.19) 7)  Early Satiety  (ICD-780.94) 8)  Epigastric Pain  (ICD-789.06) 9)  Special Screening Malig Neoplasms Other Sites  (ICD-V76.49) 10)  Osteoarthritis  (ICD-715.90) 11)  Gerd  (ICD-530.81) 12)  Hyperlipidemia  (ICD-272.4) 13)  Symptom, Malaise and Fatigue Nec  (ICD-780.79) 14)  Hypertension, Benign  (ICD-401.1) 15)  Chest Pain, Atypical  (ICD-786.59)  Current Medications (verified): 1)  Multivitamins   Tabs (Multiple Vitamin) .... Once Daily 2)  Caltrate  600+d 600-400 Mg-Unit  Tabs (Calcium Carbonate-Vitamin D) .Marland Kitchen.. 1 By Mouth Two Times A Day 3)  Tylenol 325 Mg  Tabs (Acetaminophen) .... As Needed 4)  Lisinopril-Hydrochlorothiazide 20-25 Mg Tabs (Lisinopril-Hydrochlorothiazide) .Marland Kitchen.. 1 Tab By Mouth Daily 5)  Metoprolol Tartrate 50 Mg  Tabs (Metoprolol Tartrate) .... Take 1 Tab Two Times A Day 6)  Meloxicam 7.5 Mg  Tabs (Meloxicam) .... Take 1 Tablet By Mouth Once A Day  As Needed 7)  Metoprolol Tartrate 25 Mg  Tabs (Metoprolol Tartrate) .Marland Kitchen.. 1 Tab By Mouth Two Times A Day Along With 50 Mg Tablet 8)  Aspirin 81 Mg  Tabs (Aspirin) .... Take 1 Tablet By Mouth Once A Day 9)  Omeprazole 40 Mg Cpdr (Omeprazole) .... Take 1 Tablet By Mouth Once A Day 10)  Glucosamine Sulfate 500 Mg Caps (Glucosamine Sulfate) 11)  Aleve 220 Mg Tabs (Naproxen Sodium) .... As Needed  Allergies (verified): No Known Drug Allergies  Past History:  Past medical, surgical, family and social histories (including risk factors) reviewed, and no changes noted (except as noted below).  Past Medical History:    Reviewed history from 12/23/2007 and no changes required:    OSTEOARTHRITIS (ICD-715.90)    GERD (ICD-530.81)    HYPERLIPIDEMIA (ICD-272.4)    SYMPTOM, MALAISE AND FATIGUE NEC (ICD-780.79)    HYPERTENSION, BENIGN (ICD-401.1)    CHEST PAIN, ATYPICAL (ICD-786.59)    GASTRIC ULCER  Past Surgical History:    Reviewed history from 11/03/2007 and no changes required:    left  knee replacement Charlann Boxer)    Appendectomy    shoulder dislocation right shoulder (Supple)    10/08 nuclear Cardiolyte neg  Family History:    Reviewed history from 08/06/2007 and no changes required:       father died age 7 CAD       mother died age 73 old age       4 sisters: HTN       no cancer       MI < age 39  Social History:    Reviewed history from 08/06/2007 and no changes required:       Retired Optometrist, Arboriculturist, Academic librarian       Never Smoked       widowed        Alcohol use-no       Drug use-no       Regular exercise-yes, walks       Diet: fruit and veggies  Review of Systems       occ insomnia, occ urinating at night General:  Denies fatigue. CV:  Denies chest pain or discomfort. Resp:  Denies shortness of breath. GI:  Denies abdominal pain.  Physical Exam  General:  Elderly female overweight in NAD Mouth:  Oral mucosa and oropharynx without lesions or exudates.  Teeth in good repair. Lungs:  Normal respiratory effort, chest expands symmetrically. Lungs are clear to auscultation, no crackles or wheezes. Heart:  Normal rate and regular rhythm. S1 and S2 normal without gallop, murmur, click, rub or other extra sounds. Pulses:  R and L posterior tibial pulses are full and equal bilaterally  Extremities:  no edema   Impression & Recommendations:  Problem # 1:  HYPERTENSION, BENIGN (ICD-401.1) Improved control on higher dose of medicaiton. Encouraged exercise, weight loss, healthy eating habits.  Her updated medication list for this problem includes:    Lisinopril-hydrochlorothiazide 20-25 Mg Tabs (Lisinopril-hydrochlorothiazide) .Marland Kitchen... 1 tab by mouth daily    Metoprolol Tartrate 50 Mg Tabs (Metoprolol tartrate) .Marland Kitchen... Take 1 tab two times a day    Metoprolol Tartrate 25 Mg Tabs (Metoprolol tartrate) .Marland Kitchen... 1 tab by mouth two times a day along with 50 mg tablet At next appt need to schedule mammogram and bone density...had CPE in 10/2008, but not due for these until 05/2009. Info on sleep hygeine given.   Complete Medication List: 1)  Multivitamins Tabs (Multiple vitamin) .... Once daily 2)  Caltrate 600+d 600-400 Mg-unit Tabs (Calcium carbonate-vitamin d) .Marland Kitchen.. 1 by mouth two times a day 3)  Tylenol 325 Mg Tabs (Acetaminophen) .... As needed 4)  Lisinopril-hydrochlorothiazide 20-25 Mg Tabs (Lisinopril-hydrochlorothiazide) .Marland Kitchen.. 1 tab by mouth daily 5)  Metoprolol Tartrate 50 Mg Tabs (Metoprolol tartrate) .... Take 1 tab two times a day 6)   Meloxicam 7.5 Mg Tabs (Meloxicam) .... Take 1 tablet by mouth once a day  as needed 7)  Metoprolol Tartrate 25 Mg Tabs (Metoprolol tartrate) .Marland Kitchen.. 1 tab by mouth two times a day along with 50 mg tablet 8)  Aspirin 81 Mg Tabs (Aspirin) .... Take 1 tablet by mouth once a day 9)  Omeprazole 40 Mg Cpdr (Omeprazole) .... Take 1 tablet by mouth once a day 10)  Glucosamine Sulfate 500 Mg Caps (Glucosamine sulfate) 11)  Aleve 220 Mg Tabs (Naproxen sodium) .... As needed  Hypertension Assessment/Plan:      The patient's hypertensive risk group is category B: At least one risk factor (excluding diabetes) with no target organ damage.  Today's blood pressure is 142/66.  Her blood pressure goal is < 140/90.  Patient Instructions: 1)  Please schedule a follow-up appointment in 3 months HTN    Current Allergies (reviewed today): No known allergies  PAP Next Due:  Not Indicated

## 2010-11-20 NOTE — Progress Notes (Signed)
  Phone Note Refill Request Message from:  [employer] on September 11, 2008 8:20 AM  Refills Requested: Medication #1:  MELOXICAM 7.5 MG  TABS Take 1 tablet by mouth once a day  as needed [facility] [place]  [phone]   Method Requested: Electronic Initial call taken by: Delilah Shan,  September 11, 2008 8:22 AM      Prescriptions: MELOXICAM 7.5 MG  TABS (MELOXICAM) Take 1 tablet by mouth once a day  as needed  #30 x 3   Entered and Authorized by:   Hannah Beat MD   Signed by:   Hannah Beat MD on 09/11/2008   Method used:   Electronically to        [facility]  [place] (retail)       [address]       [place], [place]  [address]       Ph: [phone] or [phone]       Fax: [phone]   RxID:   [id number]

## 2010-11-21 ENCOUNTER — Ambulatory Visit: Admit: 2010-11-21 | Payer: Self-pay | Admitting: Family Medicine

## 2010-11-21 ENCOUNTER — Other Ambulatory Visit: Payer: Self-pay

## 2010-12-02 ENCOUNTER — Other Ambulatory Visit: Payer: Self-pay | Admitting: Family Medicine

## 2010-12-02 ENCOUNTER — Encounter: Payer: Self-pay | Admitting: Family Medicine

## 2010-12-02 ENCOUNTER — Encounter (INDEPENDENT_AMBULATORY_CARE_PROVIDER_SITE_OTHER): Payer: Medicare Other | Admitting: Family Medicine

## 2010-12-02 DIAGNOSIS — R9389 Abnormal findings on diagnostic imaging of other specified body structures: Secondary | ICD-10-CM

## 2010-12-02 DIAGNOSIS — R229 Localized swelling, mass and lump, unspecified: Secondary | ICD-10-CM | POA: Insufficient documentation

## 2010-12-02 DIAGNOSIS — I10 Essential (primary) hypertension: Secondary | ICD-10-CM

## 2010-12-02 DIAGNOSIS — R7309 Other abnormal glucose: Secondary | ICD-10-CM

## 2010-12-02 DIAGNOSIS — R918 Other nonspecific abnormal finding of lung field: Secondary | ICD-10-CM

## 2010-12-02 DIAGNOSIS — E785 Hyperlipidemia, unspecified: Secondary | ICD-10-CM

## 2010-12-02 DIAGNOSIS — Z0000 Encounter for general adult medical examination without abnormal findings: Secondary | ICD-10-CM

## 2010-12-04 ENCOUNTER — Ambulatory Visit (INDEPENDENT_AMBULATORY_CARE_PROVIDER_SITE_OTHER)
Admission: RE | Admit: 2010-12-04 | Discharge: 2010-12-04 | Disposition: A | Discharge status: Home / Self Care | Payer: Medicare Other | Source: Ambulatory Visit | Attending: Family Medicine | Admitting: Family Medicine

## 2010-12-04 DIAGNOSIS — R9389 Abnormal findings on diagnostic imaging of other specified body structures: Secondary | ICD-10-CM

## 2010-12-06 ENCOUNTER — Encounter: Payer: Self-pay | Admitting: Family Medicine

## 2010-12-11 NOTE — Letter (Signed)
Summary: Nature conservation officer [employer] Wellness Visit Questionnaire   [employer] Annual Wellness Visit Questionnaire   Imported By: Beau Fanny 12/06/2010 14:17:04  _____________________________________________________________________  External Attachment:    Type:   Image     Comment:   External Document

## 2010-12-11 NOTE — Assessment & Plan Note (Signed)
Summary: Physical   Vital Signs:  Patient profile:   77 year old female Weight:      155.75 pounds BMI:     27.69 Temp:     97.9 degrees F oral Pulse rate:   76 / minute Pulse rhythm:   regular BP sitting:   120 / 62  (left arm) Cuff size:   regular  Vitals Entered By: Sydell Axon LPN (December 02, 2010 1:48 PM)  Nutrition Counseling: Patient's BMI is greater than 25 and therefore counseled on weight management options. CC: 30 Minute checkup   History of Present Illness: I have personally reviewed the Medicare Annual Wellness questionnaire and have noted 1.   The patient's medical and social history 2.   Their use of alcohol, tobacco or illicit drugs 3.   Their current medications and supplements 4.   The patient's functional ability including ADL's, fall risks, home safety risks and hearing or visual             impairment. 5.   Diet and physical activities 6.   Evidence for depression or mood disorders  The patients weight, height, BMI and visual acuity have been recorded in the chart I have made referrals, counseling and provided education to the patient based review of the above and I have provided the pt with a written personalized care plan for preventive services.   HTN, well controlled on current meds.   High cholesterol: LD at goal <130, but triglycerides remain high. Last OV recommended fish oil 2000 mg daily...using daily.  Prediabetes: worsened control despite 4 more lb weight loss.   Hx of abnormal CT.. last CT in 11/2009.. recommended 6 mo  follow up.   Had wellness screening at [facility].  Preventive Screening-Counseling & Management  Alcohol-Tobacco     Alcohol drinks/day: 0     Smoking Status: never  Caffeine-Diet-Exercise     Diet Comments: moderate      Diet Counseling: to improve diet; diet is suboptimal     Does Patient Exercise: yes     Exercise (avg: min/session): walking     Times/week: <3     Exercise Counseling: to improve exercise  regimen  Problems Prior to Update: 1)  Gastritis  (ICD-535.50) 2)  Abdominal Pain-epigastric  (ICD-789.06) 3)  Osteopenia  (ICD-733.90) 4)  Computerized Tomography, Chest, Abnormal  (ICD-793.1) 5)  Prediabetes  (ICD-790.29) 6)  Neoplasm, Skin, Uncertain Behavior  (ICD-238.2) 7)  Pud  (ICD-533.90) 8)  Erosive Esophagitis  (ICD-530.19) 9)  Osteoarthritis  (ICD-715.90) 10)  Gerd  (ICD-530.81) 11)  Hyperlipidemia  (ICD-272.4) 12)  Hypertension, Benign  (ICD-401.1)  Current Medications (verified): 1)  Multivitamins   Tabs (Multiple Vitamin) .... Once Daily 2)  Caltrate 600+d 600-400 Mg-Unit  Tabs (Calcium Carbonate-Vitamin D) .Marland Kitchen.. 1 By Mouth Two Times A Day 3)  Tylenol 325 Mg  Tabs (Acetaminophen) .... As Needed 4)  Lisinopril-Hydrochlorothiazide 20-25 Mg Tabs (Lisinopril-Hydrochlorothiazide) .Marland Kitchen.. 1 Tab By Mouth Daily 5)  Metoprolol Tartrate 50 Mg  Tabs (Metoprolol Tartrate) .... Take 1 Tab Two Times A Day 6)  Metoprolol Tartrate 25 Mg  Tabs (Metoprolol Tartrate) .Marland Kitchen.. 1 Tab By Mouth Two Times A Day Along With 50 Mg Tablet 7)  Aspirin 81 Mg  Tabs (Aspirin) .... Take 1 Tablet By Mouth Once A Day 8)  Glucosamine Sulfate 500 Mg Caps (Glucosamine Sulfate) 9)  Prilosec 40 Mg Cpdr (Omeprazole) .... Take One Tablet By Mouth Daily 10)  Nystatin 100000 Unit/gm Crea (Nystatin) .... Apply To Affected Area  Two Times A Day X 2 Week, Continue 48 Hours After Symptoms Resolved. 11)  Dicyclomine Hcl 10 Mg  Caps (Dicyclomine Hcl) .Marland Kitchen.. 1-2 Every 6 Hrs As Needed For Abdominal Pain 12)  Icaps Mv  Tabs (Multiple Vitamins-Minerals) .... Take One By Mouth Two Times A Day 13)  Tramadol-Acetaminophen 37.5-325 Mg Tabs (Tramadol-Acetaminophen) .... Take 1-2 By Mouth Every 8 Hours As Needed  Allergies: No Known Drug Allergies  Past History:  Past medical, surgical, family and social histories (including risk factors) reviewed, and no changes noted (except as noted below).  Past Medical History: Reviewed history  from 12/23/2007 and no changes required. OSTEOARTHRITIS (ICD-715.90) GERD (ICD-530.81) HYPERLIPIDEMIA (ICD-272.4) SYMPTOM, MALAISE AND FATIGUE NEC (ICD-780.79) HYPERTENSION, BENIGN (ICD-401.1) CHEST PAIN, ATYPICAL (ICD-786.59) GASTRIC ULCER  Past Surgical History: Reviewed history from 06/15/2009 and no changes required. left knee replacement Charlann Boxer) Appendectomy shoulder dislocation right shoulder (Supple) 10/08 nuclear Cardiolyte neg 05/2009 knee surgery to cut nerves  Family History: Reviewed history from 08/06/2007 and no changes required. father died age 437 CAD mother died age 59 old age 43 sisters: HTN no cancer MI < age 67  Social History: Reviewed history from 12/12/2009 and no changes required. 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 CV:  Denies chest pain or discomfort. Resp:  Denies shortness of breath. GI:  Complains of abdominal pain; denies bloody stools, nausea, and vomiting; Abdominal pain improved on dicyclomine and PPI.  HAs seen Dr. Leone Payor in past. . GU:  Denies dysuria. Derm:  Denies lesion(s). Psych:  Denies anxiety and depression.  Physical Exam  General:  elderly overweight female in NAD  Ears:  B ears occluded with wax..ears irrigated By NT Nose:  External nasal examination shows no deformity or inflammation. Nasal mucosa are pink and moist without lesions or exudates. Mouth:  Oral mucosa and oropharynx without lesions or exudates.  Teeth in good repair. Neck:  no carotid bruit or thyromegaly no cervical or supraclavicular lymphadenopathy  Lungs:  Normal respiratory effort, chest expands symmetrically. Lungs are clear to auscultation, no crackles or wheezes. Heart:  Normal rate and regular rhythm. S1 and S2 normal without  gallop, murmur, click, rub or other extra sounds. Abdomen:  Bowel sounds positive,abdomen soft and non-tender without masses, organomegaly or hernias noted. Pulses:  R and L posterior tibial pulses are full and equal bilaterally  Extremities:  no edema  Skin:  multiple SKs 4 inch diameter, soft mobile lesion at base of right neck... likely lipoma Psych:  Cognition and judgment appear intact. Alert and cooperative with normal attention span and concentration. No apparent delusions, illusions, hallucinations   Impression & Recommendations:  Problem # 1:  Preventive Health Care (ICD-V70.0) The patient's preventative maintenance and recommended screening tests for an annual wellness exam were reviewed in full today. Brought up to date unless services declined.  Counselled on the importance of diet, exercise, and its role in overall health and mortality. The patient's FH and SH was reviewed, including their home life, tobacco status, and drug and alcohol status.     Problem # 2:  PREDIABETES (ICD-790.29) Worsened control. Disucssed diet and lifestyle changes. info given. Will check A1C at next OV.   Problem # 3:  COMPUTERIZED TOMOGRAPHY, CHEST, ABNORMAL (ICD-793.1) Due for reeval. if neg no further eval needed.  Orders: Radiology Referral (Radiology)  Problem #  4:  HYPERLIPIDEMIA (ICD-272.4) Triglycerided elvated. Work on low carb and low fat diet. Recheck fasting LIPIDS, AST, ALT  in 3 months Dx 272.0     Problem # 5:  HYPERTENSION, BENIGN (ICD-401.1) Well controlled. Continue current medication.  Her updated medication list for this problem includes:    Lisinopril-hydrochlorothiazide 20-25 Mg Tabs (Lisinopril-hydrochlorothiazide) .Marland Kitchen... 1 tab by mouth daily    Metoprolol Tartrate 50 Mg Tabs (Metoprolol tartrate) .Marland Kitchen... Take 1 tab two times a day    Metoprolol Tartrate 25 Mg Tabs (Metoprolol tartrate) .Marland Kitchen... 1 tab by mouth two times a day along with 50 mg tablet  BP today: 120/62 Prior  BP: 130/80 (06/03/2010)  Prior 10 Yr Risk Heart Disease: 13 % (06/03/2010)  Labs Reviewed: K+: 4.5 (11/18/2010) Creat: : 1.0 (11/18/2010)   Chol: 205 (11/18/2010)   HDL: 35.60 (11/18/2010)   LDL: 128 (06/12/2009)   TG: 208.0 (11/18/2010)  Problem # 6:  LOCALIZED SUPERFICIAL SWELLING MASS OR LUMP (ICD-782.2) Given increase in size and location... bothers pt. Will refer to surgeon for discussion of wheteher area needs to be removed or okay to continue to follow.  Orders: Surgical Referral (Surgery)  Complete Medication List: 1)  Multivitamins Tabs (Multiple vitamin) .... Once daily 2)  Caltrate 600+d 600-400 Mg-unit Tabs (Calcium carbonate-vitamin d) .Marland Kitchen.. 1 by mouth two times a day 3)  Tylenol 325 Mg Tabs (Acetaminophen) .... As needed 4)  Lisinopril-hydrochlorothiazide 20-25 Mg Tabs (Lisinopril-hydrochlorothiazide) .Marland Kitchen.. 1 tab by mouth daily 5)  Metoprolol Tartrate 50 Mg Tabs (Metoprolol tartrate) .... Take 1 tab two times a day 6)  Metoprolol Tartrate 25 Mg Tabs (Metoprolol tartrate) .Marland Kitchen.. 1 tab by mouth two times a day along with 50 mg tablet 7)  Aspirin 81 Mg Tabs (Aspirin) .... Take 1 tablet by mouth once a day 8)  Glucosamine Sulfate 500 Mg Caps (Glucosamine sulfate) 9)  Prilosec 40 Mg Cpdr (Omeprazole) .... Take one tablet by mouth daily 10)  Nystatin 100000 Unit/gm Crea (Nystatin) .... Apply to affected area two times a day x 2 week, continue 48 hours after symptoms resolved. 11)  Dicyclomine Hcl 10 Mg Caps (Dicyclomine hcl) .Marland Kitchen.. 1-2 every 6 hrs as needed for abdominal pain 12)  Icaps Mv Tabs (Multiple vitamins-minerals) .... Take one by mouth two times a day 13)  Tramadol-acetaminophen 37.5-325 Mg Tabs (Tramadol-acetaminophen) .... Take 1-2 by mouth every 8 hours as needed  Other Orders: Medicare -1st Annual Wellness Visit [phone])  Patient Instructions: 1)  I have provided you with a copy of your personalized plan for preventive services. Please take the time to review along with  your updated medication list. 2)  Decrease  soda, bread, sweets. 3)   Increase fish oil to  2 tab by mouth two times a day. 4)  When due for mammogram after 9/15.. make sure you schedule bone density as well. 5)  In 3 months return for CMET, lipids, A1C Dx 272.0, 790.29 6)  Please schedule a follow-up appointment in 6 months .  7)   Bring by Wellness forms when able. 8)  Referral Appointment Information 9)  Day/Date: 10)  Time: 11)  Place/MD: 12)  Address: 13)  Phone/Fax: 14)  Patient given appointment information. Information/Orders faxed/mailed.    Orders Added: 1)  Radiology Referral [Radiology] 2)  Medicare -1st Annual Wellness Visit [G0438] 3)  Surgical Referral [Surgery] 4)  Est. Patient Level III [60454]    Current Allergies (reviewed today): No known allergies   Last Flu Vaccine:  given (07/20/2008 9:52:38  AM) Flu Vaccine Result Date:  08/20/2010 Flu Vaccine Result:  given Flu Vaccine Next Due:  1 yr Herpes Zoster Result Date:  10/20/2008 Herpes Zoster Result:  given at [facility] Herpes Zoster Next Due:  Not Indicated

## 2010-12-31 NOTE — Consult Note (Signed)
Summary: [facility]   Imported By: Lanelle Bal 12/23/2010 12:46:52  _____________________________________________________________________  External Attachment:    Type:   Image     Comment:   External Document

## 2011-01-08 ENCOUNTER — Other Ambulatory Visit: Payer: Self-pay | Admitting: General Surgery

## 2011-01-26 LAB — URINALYSIS, ROUTINE W REFLEX MICROSCOPIC
Bilirubin Urine: NEGATIVE
Glucose, UA: NEGATIVE mg/dL
Hgb urine dipstick: NEGATIVE
Ketones, ur: NEGATIVE mg/dL
Nitrite: NEGATIVE
Protein, ur: NEGATIVE mg/dL
Specific Gravity, Urine: 1.014 (ref 1.005–1.030)
Urobilinogen, UA: 0.2 mg/dL (ref 0.0–1.0)
pH: 5 (ref 5.0–8.0)

## 2011-01-26 LAB — CBC
HCT: 37.6 % (ref 36.0–46.0)
Hemoglobin: 12.7 g/dL (ref 12.0–15.0)
MCHC: 33.8 g/dL (ref 30.0–36.0)
MCV: 94.4 fL (ref 78.0–100.0)
Platelets: 157 10*3/uL (ref 150–400)
RBC: 3.98 MIL/uL (ref 3.87–5.11)
RDW: 13.3 % (ref 11.5–15.5)
WBC: 7 10*3/uL (ref 4.0–10.5)

## 2011-01-26 LAB — BASIC METABOLIC PANEL
BUN: 23 mg/dL (ref 6–23)
CO2: 24 mEq/L (ref 19–32)
Calcium: 9 mg/dL (ref 8.4–10.5)
Chloride: 110 mEq/L (ref 96–112)
Creatinine, Ser: 0.97 mg/dL (ref 0.4–1.2)
GFR calc Af Amer: >60 mL/min (ref >60)
GFR calc non Af Amer: 55 mL/min — ABNORMAL LOW (ref >60)
Glucose, Bld: 100 mg/dL — ABNORMAL HIGH (ref 70–99)
Potassium: 4 mEq/L (ref 3.5–5.1)
Sodium: 139 mEq/L (ref 135–145)

## 2011-01-26 LAB — DIFFERENTIAL
Basophils Absolute: 0.1 10*3/uL (ref 0.0–0.1)
Basophils Relative: 2 % — ABNORMAL HIGH (ref 0–1)
Eosinophils Absolute: 0.3 10*3/uL (ref 0.0–0.7)
Eosinophils Relative: 5 % (ref 0–5)
Lymphocytes Relative: 32 % (ref 12–46)
Lymphs Abs: 2.3 10*3/uL (ref 0.7–4.0)
Monocytes Absolute: 0.6 10*3/uL (ref 0.1–1.0)
Monocytes Relative: 8 % (ref 3–12)
Neutro Abs: 3.7 10*3/uL (ref 1.7–7.7)
Neutrophils Relative %: 53 % (ref 43–77)

## 2011-01-26 LAB — APTT: aPTT: 37 seconds (ref 24–37)

## 2011-01-26 LAB — PROTIME-INR
INR: 1 (ref 0.00–1.49)
Prothrombin Time: 13.5 seconds (ref 11.6–15.2)

## 2011-02-20 ENCOUNTER — Other Ambulatory Visit: Payer: Self-pay | Admitting: *Deleted

## 2011-02-20 MED ORDER — LISINOPRIL-HYDROCHLOROTHIAZIDE 20-25 MG PO TABS: 1.0000 | ORAL_TABLET | Freq: Every day | ORAL | Status: DC

## 2011-02-20 MED ORDER — METOPROLOL TARTRATE 50 MG PO TABS: 50.0000 mg | ORAL_TABLET | Freq: Two times a day (BID) | ORAL | Status: DC

## 2011-03-03 ENCOUNTER — Other Ambulatory Visit (INDEPENDENT_AMBULATORY_CARE_PROVIDER_SITE_OTHER): Payer: Medicare Other

## 2011-03-03 DIAGNOSIS — R7309 Other abnormal glucose: Secondary | ICD-10-CM

## 2011-03-03 DIAGNOSIS — E7800 Pure hypercholesterolemia, unspecified: Secondary | ICD-10-CM

## 2011-03-03 LAB — HEPATIC FUNCTION PANEL
ALT: 15 U/L (ref 0–35)
AST: 17 U/L (ref 0–37)
Albumin: 3.5 g/dL (ref 3.5–5.2)
Alkaline Phosphatase: 46 U/L (ref 39–117)
Bilirubin, Direct: 0 mg/dL (ref 0.0–0.3)
Total Bilirubin: 0.5 mg/dL (ref 0.3–1.2)
Total Protein: 6.2 g/dL (ref 6.0–8.3)

## 2011-03-03 LAB — BASIC METABOLIC PANEL
BUN: 34 mg/dL — ABNORMAL HIGH (ref 6–23)
CO2: 26 mEq/L (ref 19–32)
Calcium: 9.2 mg/dL (ref 8.4–10.5)
Chloride: 104 mEq/L (ref 96–112)
Creatinine, Ser: 1.1 mg/dL (ref 0.4–1.2)
GFR: 52 mL/min — ABNORMAL LOW (ref >60.00)
Glucose, Bld: 104 mg/dL — ABNORMAL HIGH (ref 70–99)
Potassium: 4 mEq/L (ref 3.5–5.1)
Sodium: 136 mEq/L (ref 135–145)

## 2011-03-03 LAB — HEMOGLOBIN A1C: Hgb A1c MFr Bld: 6.2 % (ref 4.6–6.5)

## 2011-03-03 NOTE — Progress Notes (Signed)
Addended by: Melody Comas on: 03/03/2011 09:22 AM   Modules accepted: Orders

## 2011-03-04 NOTE — Op Note (Signed)
[name], [name]                ACCOUNT NO.:  [id number]   MEDICAL RECORD NO.:  [id number]          PATIENT TYPE:  OIB   LOCATION:  0098                         FACILITY:  [facility]   PHYSICIAN:  Caroline Frankel. Charlann Bryant, M.D.  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   ASSISTANT:  Surgical tech.   ANESTHESIA:  General LMA.   COMPLICATIONS:  None.   TOURNIQUET TIME:  14 minutes at 200 mmHg.   SPECIMENS:  I did send off the identified tissue to pathology for  identification.   INDICATIONS:  This patient is an 77 year old female with a history of  left total knee replacement.  She had complained of pain for a long time  with zero concern for clinical infection including bone scan, lab  workup, as well as her evaluation of wound which had healed.  She had an  excellent range of motion.  In the office she had undergone further  workup and evaluation.  Two separate times I injected this area with  complete resolution of her symptoms for a short period of time.  We had  attempted trials of Lidoderm patches without acceptable long-term  relief.  After the last attempted injection in this area, she wished at  this point to proceed with excision, understanding the risk of numbness  across the knee area but for the benefit of pain relief.  I felt this  was a good option.  There was no need for any other major revision knee  surgery.  The risks and benefits were discussed, and consent was  obtained.   PROCEDURE IN DETAIL:  The patient was seen and evaluated in the preop  holding area while she  was still awake, and we again examined her and  identified by marking on her skin the location of her pain on the medial  aspect of the joint and inferior to the patella.  I then signed her leg.  She was taken back to operating room.  Once adequate anesthesia was  established and preoperative antibiotics, Ancef, administered, she was  positioned supine.  A thigh tourniquet was placed.  The left lower  extremity was prescrubbed, prepped, and draped in sterile fashion.  My  landmarks remained identified.   Once a time-out was performed, identifying the patient and planned  procedure and extremity, the leg was exsanguinated and the tourniquet  elevated to 200 mmHg.   I incised through her old incision, probably a section about 4 cm.  I  basically did a subcutaneous dissection sharply.  It was in this plane  that I was able to identify nerve tissue.  I dissected it back using  dissecting scissors and pickups.  Observing the path of  the nerve was  pretty much identical to the location that I had marked on her skin  where she had felt the pain.  Given these findings, I went ahead and  amputated nerve more proximal and then digitally pushed back into the  deep soft tissues.   Once I had done this, I irrigated the wound with normal saline solution.  I reapproximated the subcu layer with 2-0 Vicryl and used 4-0 Monocryl  on the skin.  The skin was cleaned, dried, and dressed sterilely with  Steri-Strips.  I then injected some Marcaine in the area as well to help  with some postoperative pain.   I did send the nerve tissue off to pathology for identification to  confirm that this was nerve tissue.   She then brought to the recovery room, extubated, in stable condition,  tolerated the procedure well.      [name], M.D.  Electronically Signed     MDO/MEDQ  D:  05/21/2009  T:  05/21/2009  Job:  [id number]

## 2011-03-04 NOTE — Assessment & Plan Note (Signed)
[facility]                         GASTROENTEROLOGY OFFICE NOTE   NAME:Hackel, Deneisha                         MRN:          [id number]  DATE:12/23/2007                            DOB:          07-Nov-1924    CHIEF COMPLAINT:  Followup of gastric ulcer reflux.   She is much better on omeprazole 40 mg daily.  There is an occasional  upper abdominal pain, but no real heartburn or abdominal pain.  She had  a benign gastric ulcer and gastritis as well as reflux esophagitis.  I  increased her omeprazole from 20 to 40 mg daily after her endoscopy of  November 16, 2007.   Medications are listed and reviewed in the chart as is past medical  history.   Weight:  165 pounds (stable).  Pulse 80, blood pressure 140/78.   ASSESSMENT:  1. Reflux esophagitis, improved on increased dose of omeprazole.  2. Gastric ulcers and gastritis  Question related to NSAIDs.  There      was no H. pylori.  These symptoms are improved on omeprazole 40 mg      daily.  3. She is 82.  She is very functional. She has never had a screening      colonoscopy.   PLAN:  1. Continue omeprazole 40 mg daily.  I do not plan to repeat an EGD to      assess the ulcer given the benign biopsies and the response to      therapy.  She should be judicious in her use of NSAIDs, though I      think she could continue an aspirin a day.  2. I discussed screening colonoscopy and the rationale and      indications.  I think it is reasonable that she try a screening      colonoscopy.  If it is difficult, we will abort, but since she has      never had one, if she had an advanced lesion, having the      colonoscopy could have an impact on her longevity.  Risks, benefits      and indications are explained.  She understands and agrees to      proceed.     Iva Boop, MD,FACG  Electronically Signed    CEG/MedQ  DD: 12/23/2007  DT: 12/23/2007  Job #: [id number]   cc:   Kerby Nora, MD

## 2011-03-07 NOTE — H&P (Signed)
[name], [name]                ACCOUNT NO.:  [id number]   MEDICAL RECORD NO.:  [id number]          PATIENT TYPE:  INP   LOCATION:  NA                           FACILITY:  [facility]   PHYSICIAN:  Madlyn Frankel. Charlann Boxer, M.D.  DATE OF BIRTH:  1925/01/05   DATE OF ADMISSION:  09/30/2005  DATE OF DISCHARGE:                                HISTORY & PHYSICAL   PRINCIPAL DIAGNOSIS:  End-stage left knee degenerative joint disease.   HISTORY OF PRESENT ILLNESS:  [name] is a pleasant 77 year old female  whom I have been following in the office for left knee pain.  She is  currently about 6 months out from a left knee arthroscopy which was noted at  the time to have some degenerative meniscal pathology with some degenerative  joint disease changes.  She has failed to have relief of her symptoms.  She  actually went on to develop some subchondral edema and persistent pain.  Despite efforts at conservative measures of rest and inactivity, she has had  persistent pain.  Other conservative measures that have failed have been  medications including pain medication, anti-inflammatory injections.  Nothing has provided relief.  She, at this point, feels that her quality of  life has diminished considerably and wishes to proceed with advanced  treatment of total joint replacement.   She reports no fevers, chills, night sweats, no further complicating  features of her total knee procedure.  Followup MRI had been ordered which  revealed these subchondral edema changes.   PAST MEDICAL HISTORY:  1.  Hypertension.  2.  History of a right shoulder dislocation and fracture.  3.  History of apnea.  4.  Osteoarthritis.   PAST SURGICAL HISTORY:  Includes appendectomy and left knee arthroscopy as  noted in HPI.   CURRENT MEDICATIONS:  Include Advil, Vicodin, Tylenol, triamterene,  hydrochlorothiazide.   DRUG ALLERGIES:  No known drug allergies.   FAMILY HISTORY:  Includes coronary artery disease and  diabetes.   REVIEW OF SYSTEMS:  Otherwise unremarkable as noted in the HPI.  She reports  no upper respiratory, pulmonary, cardiac, gastrointestinal, genitourinary  issues over the past 2 weeks.   PHYSICAL EXAMINATION:  VITAL SIGNS:  She presents with a temperature of  98.1, pulse 74, blood pressure 158/88.  GENERAL:  She is in no acute distress but does present with a cane to get  around.  Her gait is significantly altered by her left knee.  HEAD AND NECK:  Exam is normal with no evidence of asymmetry.  Normal  speech, normal ocular motions.  Her neck is supple with no nodes detected.  She has no bruits on auscultation.  CHEST: Clear to auscultation bilaterally.  HEART:  Regular rate and rhythm.  No murmurs detected.  ABDOMEN: Soft and nontender with positive bowel sounds.  EXTREMITIES:  Examination of the left knee reveals she has a 5 degree  flexion, structurally stable ligaments, painful range of motion with  crepitation noted with medial sided pain most prominent.  She is otherwise  neurovascularly intact.  SKIN:  Otherwise intact.  No signs of  cellulitis.  Well-healed surgical  portals.   Radiographs reveal left knee degenerative joint disease.   IMPRESSION:  End-stage left knee degenerative joint disease.   PLAN:  The patient will be admitted on September 30, 2005, for same-day  surgery. She will be scheduled for left total knee replacement.  Risks and  benefits were reviewed in the office for the procedure.  Consent will be  obtained based on discussion then.  Questions were encouraged and answered  regarding her entire hospital stay and expectations.  Please note that she  will probably need a rehab consult based on her living arrangements, and  that is what the plan will be unless she does phenomenally well during her  hospital stay.  She has been medically cleared by Dr. Louanna Bryant, Medical  Director, Urgent Care.      Madlyn Frankel Charlann Boxer, M.D.  Electronically  Signed     MDO/MEDQ  D:  09/23/2005  T:  09/23/2005  Job:  [id number]

## 2011-03-07 NOTE — Op Note (Signed)
[name], [name]                ACCOUNT NO.:  [id number]   MEDICAL RECORD NO.:  [id number]          PATIENT TYPE:  INP   LOCATION:  0002                         FACILITY:  [facility]   PHYSICIAN:  Madlyn Frankel. Charlann Boxer, M.D.  DATE OF BIRTH:  11-26-1924   DATE OF PROCEDURE:  09/30/2005  DATE OF DISCHARGE:                                 OPERATIVE REPORT   PREOPERATIVE DIAGNOSIS:  End-stage left knee osteoarthritis.   POSTOPERATIVE DIAGNOSIS:  End-stage left knee osteoarthritis.   PROCEDURE:  Left total knee replacement.   COMPONENTS USED:  A DePuy rotating platform posterior stabilized knee system  with a size 3 femur, 2.5 tibia, and a 12.5 mm polyethylene posterior  stabilized rotating platform polyethylene liner, and a 35 patellar button.   SURGEON:  Madlyn Frankel. Charlann Boxer, M.D.   ASSISTANTDruscilla Brownie. Idolina Primer, PA-C.   ANESTHESIA:  Spinal plus MAC.   COMPLICATIONS:  None.   DRAINS:  One.   TOURNIQUET TIME:  Fifty-four minutes at 250 mmHg.   COMPLICATIONS:  None.   DISPOSITION:  Stable to the recovery room.   INDICATIONS FOR PROCEDURE:  [name] is a pleasant 77 year old female who  has been a patient of mine for some time.  She initially presented with some  degenerative changes to the meniscus, which was identified by MRI.  She was  never radiographed and did not have severe osteoarthritis.  She underwent  knee arthroscopy.  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  This failed to  resolve with conservative measures.  Upon review of her current situation  and failure of conservative measures, a total knee was discussed.  No  routine MRI indicated any flaps of cartilage.   Risks and benefits of knee replacement were discussed, including  postoperative discomfort, DVT, infection, neurovascular injury, risk of  anesthesia.   PROCEDURE IN DETAIL:  Patient was brought to the operating theater.  Once  adequate  anesthesia and preoperative antibiotics, 1 gm of Ancef, were  administered, the patient was placed in supine with the proximal thigh  tourniquet placed.  The left lower extremity was then prepped and draped in  a sterile fashion after prescrubbing the knee.  A midline incision was made  followed by a trivecta patellar approach with medial parapatellar approach  carried out.  The patella was not everted but rather subluxated out of the  way.  Knee exposure was obtained in a routine fashion.  Patient was noted,  upon entry into the knee, to have a fragmented medial tibial plateau  cartilage, probably consistent with a diagnosis of osteonecrosis.  Attention  was first directed to the femur.  Intramedullary guide was positioned at 5  degrees valgus, and 10 mm of bone was dissected off the distal femur.  The  femur was then sized and was actually noted in her anterior/posterior  dimension to be between a size 3 and a 4, I actually sized it to a 3,  elevating the cutting jig slightly anterior.  The anterior and posterior  chamfer cuts were made.  The size 3  trial appeared to fit nicely in the  femur from the medial and lateral dimensions.  The final box cut was then  made, based off the dimension of the femur and off the lateral aspect of the  medial femoral condyle.  At this point, attention was then directed to the  tibia.  Tibial exposure was then obtained in a routine fashion.  Meniscectomy was carried out.  A 10 mm cut was then made, based off of the  lateral tibial plateau.  I did choose a low point in this area to make sure  I got beneath the osteonecrotic lesion.  A proximal tibial cut was made, and  excessive bone debrided off the back of the femur and throughout the knee.  I performed a trial with a size 3 femur, a size 3 tray, and a 12.5 mm  polyethylene liner.  The knee came to full extension and was stable in  flexion and extension.  With the trial components in position, I marked   rotation.  At this point, attention was directed to the patella.  The precut  measurement was 23 mm.  I resected approximately 8-9 mm to get down to a 14  mm transverse cut.  The size 35 patellar button fit nicely in the superior  medial quadrant of the patella.  Drill holes were made.  The patella tracked  without any application of pressure.   Given this trial reduction, the final components were brought into the  field.  Please note, however, I chose to use a 2.5 tibial tray based on the  size of the tibia.   Final tibial preparation was carried out with a size 2.5 tibial tray,  rotating platform on.  This was positioned in the marks of rotation from the  previously placed fixed bearing trial tray.   At this point, the wound was copiously irrigated with normal saline  solution/pulse lavage system.   Cement was prepared and the component were then cemented into position with  the tibia and femur, followed by placement of a 12 mm spacer.  The knee came  to full extension and was held in extension.  The patella was then cemented  in position as well.  The tourniquet was let down.  Once the  cement had  cured, excessive cement was debrided throughout the knee with extensive  evaluation.  The final 12.5 posterior stabilized rotating platform poly was  then placed on the tibial tray, and the knee reduced.  Again, the knee was  noted to be stable with extension through flexion, as it was in the trial  reduction.   The extensor mechanism was then reapproximated using a #1 PDS with the knee  flexed at 90 degrees.  A medium Hemovac drain was then placed deep.   The remainder of the wound was closed in layers.  I chose to use a skin  stapler on the skin, based on the thin nature of her epidermal layer.  The  wound was then cleaned, dried, and then dressed sterilely with Adaptic  dressing, sponge, and tape.  She was transferred from the recovery room in stable condition.      Madlyn Frankel  Charlann Boxer, M.D.  Electronically Signed     MDO/MEDQ  D:  09/30/2005  T:  09/30/2005  Job:  [id number]

## 2011-03-07 NOTE — Discharge Summary (Signed)
[name], [name]                ACCOUNT NO.:  [id number]   MEDICAL RECORD NO.:  [id number]          Bryant TYPE:  ORB   LOCATION:  4527                         FACILITY:  [facility]   PHYSICIAN:  Ellwood Dense, M.D.   DATE OF BIRTH:  1924/10/31   DATE OF ADMISSION:  10/03/2005  DATE OF DISCHARGE:  10/10/2005                                 DISCHARGE SUMMARY   DISCHARGE DIAGNOSIS:  1.  Left knee osteoarthritis requiring left total knee replacement.  2.  Hypertension.  3.  Mild acute blood loss anemia.  4.  Abdominal ileus resolving.   HISTORY OF PRESENT ILLNESS:  [name] is a 77 year old female with a  history of left knee DJD.  She has failed conservative therapy and elected  to undergo left total knee replacement December 12 by Dr. Charlann Boxer.  Postop is  weight-bearing as tolerated and on Coumadin for DVT prophylaxis with INR 1.6  on admission.  Therapy is initiated and [name] is noted to be  progressing along.  She is noted to have impairment in mobility and self-  care and subacute was consulted for further therapy.   PAST MEDICAL HISTORY:  Significant for appendectomy, right shoulder  dislocation, sleep apnea, left knee scope in July 2006, and hypertension.   ALLERGIES:  No known drug allergies.   FAMILY HISTORY:  Positive for coronary artery disease and diabetes.   SOCIAL HISTORY:  [name] lives alone in a one level home with one step  at entry.  She was independent and working December 24, 2004.  She uses a cane  and walker p.r.n.  She does not use any tobacco or alcohol.   HOSPITAL COURSE:  [name] was admitted to subacute on October 04, 2005, for SACU level therapies to consist of PT and OT daily.  Past  admission subcu Lovenox was added until INR therapeutic.  Pain control was  reasonable with p.r.n. use of Oxycodone and/or Robaxin.  Blood pressures  were monitored on a b.i.d. basis on Maxzide and showed good control overall,  running from 120s to 140s  systolic, 60s to 16X diastolic.  Follow up labs  done past admission revealed hemoglobin 12.1, hematocrit 34.8, white count  10.1, platelets 293.  Check of electrolytes revealed sodium 137, potassium  4.2, chloride 101, CO2 25, BUN 18, creatinine 1, glucose 130.  LFTs were  noted to be abnormal with SGOT 140, SGPT 235, alkaline phos 190, and T-bili  0.5.  Question etiology of abnormal LFTs as no meds would be contributing to  abnormality of [name] without any GI symptoms.  Follow up LFTs were  done prior to discharge showing improvement with AST 29, ALT 89, alkaline  phos 139, T-bili 0.6.  [name]'s knee incision was monitored along and  this was healing well without any signs or symptoms of infection.  Staples  remained intact with Home Health RN to discontinue staples on October 14, 2005, past discharge.  [name] to continue on Coumadin through October 31, 2005, to complete her DVT prophylaxis.  [name] is discharged  at 5  mg daily Coumadin with Home Health RN to draw follow up protime on December  26.  During her stay in subacute, [name] made good progress.  She  progressed to being modified independent for mobility, modified independent  with ambulating 125 feet with a rolling walker, requires supervision for  navigating stairs, modified independent for ADLs.  Further follow up  therapies to include Home Health PT and OT by [facility].  Home Health RN to draw protime on December 26 with [facility] to  follow Coumadin.  On October 10, 2005, [name] is discharged to home.   DISCHARGE MEDICATIONS:  Coumadin 5 mg per day to continue to October 31, 2005, ferrous sulfate 325 mg b.i.d., Senokot S 2 p.o. q.h.s., Robaxin 500 mg  q.i.d. p.r.n. spasm, Oxy-IR 5-10 mg p.o. q.4-6h. p.r.n. pain.   DISCHARGE INSTRUCTIONS:  Diet is regular.  Activity is as tolerated with Caroline  use of walker.  No driving or strenuous activity for 4-6 weeks.  Wound  care:  Wash area with antibacterial soap and water, keep clean and dry.  Caroline  Bryant is to use Tylenol additionally for pain, ice pack to Caroline left knee  as needed, may use Milk of Magnesia p.r.n.  [name] is to follow up with  Dr. Charlann Boxer Caroline first week of January for postop check.  Follow up with Dr.  Thomasena Edis as needed.      Greg Cutter, P.A.    ______________________________  Ellwood Dense, M.D.    PP/MEDQ  D:  10/22/2005  T:  10/22/2005  Job:  [id number]   cc:   [facility]   Madlyn Frankel. Charlann Boxer, M.D.  Fax: [phone]

## 2011-03-07 NOTE — Discharge Summary (Signed)
[name], [name]                ACCOUNT NO.:  [id number]   MEDICAL RECORD NO.:  [id number]          PATIENT TYPE:  INP   LOCATION:  1507                         FACILITY:  [facility]   PHYSICIAN:  Madlyn Frankel. Charlann Boxer, M.D.  DATE OF BIRTH:  1924-11-07   DATE OF ADMISSION:  09/30/2005  DATE OF DISCHARGE:  10/03/2005                                 DISCHARGE SUMMARY   ADMISSION DIAGNOSES:  1.  Severe end-stage left knee degenerative joint disease.  2.  Hypertension.  3.  History of right shoulder dislocation/fracture.  4.  History of apnea.  5.  Osteoarthritis.   DISCHARGE DIAGNOSES:  1.  Severe end-stage left knee degenerative joint disease.  2.  Hypertension.  3.  History of right shoulder dislocation/fracture.  4.  History of apnea.  5.  Osteoarthritis.  6.  Mild postoperative anemia.   OPERATION:  On September 30, 2005, the patient underwent left total knee  replacement arthroplasty utilizing DePuy rotating platform, posterior  stabilizing knee with posterior stabilizing, rotating platform polyethylene  liner and a patellar button.   ASSISTANT:  Dooley L. Idolina Primer, PA-C.   BRIEF HISTORY:  This 77 year old lady with progressive problems concerning  degenerative changes to the knee.  MRI had shown degenerative changes to the  meniscus.  Fluoroscopy was done postoperatively.  Unfortunately, she  developed a spontaneous osteonecrosis of the proximal medial tibia.  Conservative measures failed to help this, and due to the situation and the  fact that she had potentially progressive problems with eventual inability  to use her left knee.  Total knee replacement arthroplasty was entertained.  After the risks and benefits of surgery had been described to the patient by  Dr. Charlann Boxer, it was decided to go ahead with the above procedure.   HOSPITAL COURSE:  The patient tolerated the surgical procedure quite well  and was placed on total knee protocol, which she entered into very eagerly.  She lives alone and really has no support other than herself.  We were  hoping that subacute care unit/rehab would be appropriate for the patient.  Fortunately, they agreed and the patient was allowed to enter into the  rehabilitation program in the subacute care unit at [facility].   On the day of discharge, the wound was clean and dry without any evidence of  infection.  Neurovascular was intact in the operative extremity.  She was  doing well with p.o. analgesics and then the Coumadin protocol.   Laboratory values in the hospital hematologically showed a CBC  preoperatively completely within normal limits.  Hemoglobin was 14.6,  hematocrit 43.2.  Final hemoglobin was 11.1 with a hematocrit of 32.7.  Blood chemistries remained normal other than a very slightly elevated  nonfasting glucose.  Urinalysis was negative for urinary tract infection.   Chest x-ray showed no active disease.  No electrocardiogram seen in this  chart.   CONDITION ON DISCHARGE:  Improved and stable.   PLAN:  Patient is transferred to the subacute care unit.  We will be more  than happy to follow along with her there.  Dooley L. Cherlynn June.      Madlyn Frankel Charlann Boxer, M.D.  Electronically Signed    DLU/MEDQ  D:  10/17/2005  T:  10/17/2005  Job:  [id number]

## 2011-04-07 ENCOUNTER — Other Ambulatory Visit: Payer: Self-pay | Admitting: *Deleted

## 2011-04-07 MED ORDER — METOPROLOL TARTRATE 25 MG PO TABS: ORAL_TABLET | ORAL | Status: DC

## 2011-05-09 ENCOUNTER — Ambulatory Visit: Payer: Medicare Other | Admitting: Internal Medicine

## 2011-05-28 ENCOUNTER — Encounter: Payer: Self-pay | Admitting: Family Medicine

## 2011-05-28 ENCOUNTER — Other Ambulatory Visit: Payer: Self-pay | Admitting: Family Medicine

## 2011-05-28 DIAGNOSIS — Z1231 Encounter for screening mammogram for malignant neoplasm of breast: Secondary | ICD-10-CM

## 2011-05-28 LAB — HM COLONOSCOPY

## 2011-05-28 LAB — HM SIGMOIDOSCOPY

## 2011-06-03 ENCOUNTER — Ambulatory Visit: Payer: Medicare Other | Admitting: Family Medicine

## 2011-06-12 ENCOUNTER — Ambulatory Visit (INDEPENDENT_AMBULATORY_CARE_PROVIDER_SITE_OTHER): Payer: Medicare Other | Admitting: Family Medicine

## 2011-06-12 ENCOUNTER — Encounter: Payer: Self-pay | Admitting: Family Medicine

## 2011-06-12 DIAGNOSIS — I10 Essential (primary) hypertension: Secondary | ICD-10-CM

## 2011-06-12 DIAGNOSIS — M25569 Pain in unspecified knee: Secondary | ICD-10-CM

## 2011-06-12 DIAGNOSIS — E785 Hyperlipidemia, unspecified: Secondary | ICD-10-CM

## 2011-06-12 DIAGNOSIS — R7309 Other abnormal glucose: Secondary | ICD-10-CM

## 2011-06-12 DIAGNOSIS — M25562 Pain in left knee: Secondary | ICD-10-CM | POA: Insufficient documentation

## 2011-06-12 DIAGNOSIS — M25561 Pain in right knee: Secondary | ICD-10-CM

## 2011-06-12 MED ORDER — HYDROCODONE-ACETAMINOPHEN 5-500 MG PO TABS: 1.0000 | ORAL_TABLET | Freq: Three times a day (TID) | ORAL | Status: DC | PRN

## 2011-06-12 NOTE — Assessment & Plan Note (Signed)
Improived some.. Discussed diet and lifestyle changes.

## 2011-06-12 NOTE — Assessment & Plan Note (Signed)
Recheck next OV.

## 2011-06-12 NOTE — Patient Instructions (Addendum)
Keep appointment with [facility] for right knee pain. Use vicodin for pain. AVoid over the counter ibuprofen, aleve etc given stomach issues. Call to scheduled bone density around time of mammogram.

## 2011-06-12 NOTE — Assessment & Plan Note (Signed)
Concerning for meniscal injury. Keep appt with ORTHO. Use vicodin prn pain. NSAID should be avoided if possible given hx of PUD, erosive esophagitis and GERD.

## 2011-06-12 NOTE — Progress Notes (Signed)
  Subjective:    Patient ID: [name], female    DOB: 16-Jul-1925, 77 y.o.   MRN: [id number]  HPI  77 year old female here for 6 month follow up   Hypertension:  Well controlled on lisinopril/HCTZ, lopressor  Using medication without problems or lightheadedness:  Chest pain with exertion: None Edema:None Short of breath:Stable, mild Average home BPs: Rarely checking, 110-130/70 Other issues: See below.  Elevated Cholesterol:   Last check in 10/2010, LDL at goal <130On no medication.  Prediabetes: improved. FBS 104.  Has an appt for right knee pain... Dr. Charlann Boxer at [facility].  No recent falls. No known injury.  No swelling no redness. Took old pain medication. Hard for her to walk.   Review of Systems  Constitutional: Negative for fever and fatigue.  HENT: Negative for ear pain.   Eyes: Negative for pain.  Respiratory: Negative for chest tightness and shortness of breath.   Cardiovascular: Negative for chest pain, palpitations and leg swelling.  Gastrointestinal: Negative for abdominal pain.  Genitourinary: Negative for dysuria.       Objective:   Physical Exam  Constitutional: Vital signs are normal. She appears well-developed and well-nourished. She is cooperative.  Non-toxic appearance. She does not appear ill. No distress.  HENT:  Head: Normocephalic.  Right Ear: Hearing, tympanic membrane, external ear and ear canal normal. Tympanic membrane is not erythematous, not retracted and not bulging.  Left Ear: Hearing, tympanic membrane, external ear and ear canal normal. Tympanic membrane is not erythematous, not retracted and not bulging.  Nose: No mucosal edema or rhinorrhea. Right sinus exhibits no maxillary sinus tenderness and no frontal sinus tenderness. Left sinus exhibits no maxillary sinus tenderness and no frontal sinus tenderness.  Mouth/Throat: Uvula is midline, oropharynx is clear and moist and mucous membranes are normal.  Eyes: Conjunctivae, EOM and  lids are normal. Pupils are equal, round, and reactive to light. No foreign bodies found.  Neck: Trachea normal and normal range of motion. Neck supple. Carotid bruit is not present. No mass and no thyromegaly present.  Cardiovascular: Normal rate, regular rhythm, S1 normal, S2 normal, normal heart sounds, intact distal pulses and normal pulses.  Exam reveals no gallop and no friction rub.   No murmur heard. Pulmonary/Chest: Effort normal and breath sounds normal. Not tachypneic. No respiratory distress. She has no decreased breath sounds. She has no wheezes. She has no rhonchi. She has no rales.  Abdominal: Soft. Normal appearance and bowel sounds are normal. There is no tenderness.  Musculoskeletal:       Right knee: She exhibits decreased range of motion and swelling. She exhibits no effusion.       ttp along joint line B, greatest laterally, pain with extension and flexion  Pos McMurray, neg ant/post drawer.  Neurological: She is alert.  Skin: Skin is warm, dry and intact. No rash noted.  Psychiatric: Her speech is normal and behavior is normal. Judgment and thought content normal. Her mood appears not anxious. Cognition and memory are normal. She does not exhibit a depressed mood.          Assessment & Plan:

## 2011-06-12 NOTE — Assessment & Plan Note (Signed)
Well controlled. Continue current medication.  

## 2011-07-07 ENCOUNTER — Ambulatory Visit
Admission: RE | Admit: 2011-07-07 | Discharge: 2011-07-07 | Disposition: A | Discharge status: Home / Self Care | Payer: Medicare Other | Source: Ambulatory Visit | Attending: Family Medicine | Admitting: Family Medicine

## 2011-07-07 DIAGNOSIS — Z1231 Encounter for screening mammogram for malignant neoplasm of breast: Secondary | ICD-10-CM

## 2011-07-24 ENCOUNTER — Ambulatory Visit (INDEPENDENT_AMBULATORY_CARE_PROVIDER_SITE_OTHER): Payer: Medicare Other | Admitting: Family Medicine

## 2011-07-24 DIAGNOSIS — I10 Essential (primary) hypertension: Secondary | ICD-10-CM

## 2011-07-25 NOTE — Progress Notes (Signed)
  Subjective:    Patient ID: [name], female    DOB: 1925/08/26, 77 y.o.   MRN: [id number]  HPI Not seen.   Review of Systems     Objective:   Physical Exam        Assessment & Plan:

## 2011-08-01 ENCOUNTER — Other Ambulatory Visit: Payer: Self-pay | Admitting: Orthopedic Surgery

## 2011-08-01 ENCOUNTER — Ambulatory Visit (HOSPITAL_COMMUNITY)
Admission: RE | Admit: 2011-08-01 | Discharge: 2011-08-01 | Disposition: A | Discharge status: Home / Self Care | Payer: Medicare Other | Source: Ambulatory Visit | Attending: Orthopedic Surgery | Admitting: Orthopedic Surgery

## 2011-08-01 ENCOUNTER — Other Ambulatory Visit (HOSPITAL_COMMUNITY): Payer: Self-pay | Admitting: Orthopedic Surgery

## 2011-08-01 ENCOUNTER — Encounter (HOSPITAL_COMMUNITY): Payer: Medicare Other

## 2011-08-01 DIAGNOSIS — Z01812 Encounter for preprocedural laboratory examination: Secondary | ICD-10-CM | POA: Insufficient documentation

## 2011-08-01 DIAGNOSIS — Z01810 Encounter for preprocedural cardiovascular examination: Secondary | ICD-10-CM | POA: Insufficient documentation

## 2011-08-01 DIAGNOSIS — Z01818 Encounter for other preprocedural examination: Secondary | ICD-10-CM | POA: Insufficient documentation

## 2011-08-01 DIAGNOSIS — I4949 Other premature depolarization: Secondary | ICD-10-CM | POA: Insufficient documentation

## 2011-08-01 DIAGNOSIS — J4489 Other specified chronic obstructive pulmonary disease: Secondary | ICD-10-CM | POA: Insufficient documentation

## 2011-08-01 DIAGNOSIS — I498 Other specified cardiac arrhythmias: Secondary | ICD-10-CM | POA: Insufficient documentation

## 2011-08-01 DIAGNOSIS — I517 Cardiomegaly: Secondary | ICD-10-CM | POA: Insufficient documentation

## 2011-08-01 DIAGNOSIS — J449 Chronic obstructive pulmonary disease, unspecified: Secondary | ICD-10-CM | POA: Insufficient documentation

## 2011-08-01 LAB — CBC
HCT: 36.2 % (ref 36.0–46.0)
Hemoglobin: 12.4 g/dL (ref 12.0–15.0)
MCH: 30.9 pg (ref 26.0–34.0)
MCHC: 34.3 g/dL (ref 30.0–36.0)
MCV: 90.3 fL (ref 78.0–100.0)
Platelets: 243 10*3/uL (ref 150–400)
RBC: 4.01 MIL/uL (ref 3.87–5.11)
RDW: 13.4 % (ref 11.5–15.5)
WBC: 9.4 10*3/uL (ref 4.0–10.5)

## 2011-08-01 LAB — DIFFERENTIAL
Basophils Absolute: 0 10*3/uL (ref 0.0–0.1)
Basophils Relative: 0 % (ref 0–1)
Eosinophils Absolute: 0.4 10*3/uL (ref 0.0–0.7)
Eosinophils Relative: 5 % (ref 0–5)
Lymphocytes Relative: 31 % (ref 12–46)
Lymphs Abs: 3 10*3/uL (ref 0.7–4.0)
Monocytes Absolute: 0.8 10*3/uL (ref 0.1–1.0)
Monocytes Relative: 8 % (ref 3–12)
Neutro Abs: 5.2 10*3/uL (ref 1.7–7.7)
Neutrophils Relative %: 55 % (ref 43–77)

## 2011-08-01 LAB — BASIC METABOLIC PANEL
BUN: 30 mg/dL — ABNORMAL HIGH (ref 6–23)
CO2: 27 mEq/L (ref 19–32)
Calcium: 11 mg/dL — ABNORMAL HIGH (ref 8.4–10.5)
Chloride: 101 mEq/L (ref 96–112)
Creatinine, Ser: 0.78 mg/dL (ref 0.50–1.10)
GFR calc Af Amer: 87 mL/min — ABNORMAL LOW (ref >90)
GFR calc non Af Amer: 75 mL/min — ABNORMAL LOW (ref >90)
Glucose, Bld: 104 mg/dL — ABNORMAL HIGH (ref 70–99)
Potassium: 4.6 mEq/L (ref 3.5–5.1)
Sodium: 138 mEq/L (ref 135–145)

## 2011-08-01 LAB — SURGICAL PCR SCREEN
MRSA, PCR: NEGATIVE
Staphylococcus aureus: POSITIVE — AB

## 2011-08-01 LAB — PROTIME-INR
INR: 1.04 (ref 0.00–1.49)
Prothrombin Time: 13.8 seconds (ref 11.6–15.2)

## 2011-08-01 LAB — URINALYSIS, ROUTINE W REFLEX MICROSCOPIC
Bilirubin Urine: NEGATIVE
Glucose, UA: NEGATIVE mg/dL
Hgb urine dipstick: NEGATIVE
Ketones, ur: NEGATIVE mg/dL
Nitrite: POSITIVE — AB
Protein, ur: NEGATIVE mg/dL
Specific Gravity, Urine: 1.018 (ref 1.005–1.030)
Urobilinogen, UA: 0.2 mg/dL (ref 0.0–1.0)
pH: 5 (ref 5.0–8.0)

## 2011-08-01 LAB — URINE MICROSCOPIC-ADD ON

## 2011-08-01 LAB — APTT: aPTT: 45 seconds — ABNORMAL HIGH (ref 24–37)

## 2011-08-04 NOTE — H&P (Signed)
[name], [name]                ACCOUNT NO.:  [id number]  MEDICAL RECORD NO.:  [id number]  LOCATION:  1S                           FACILITY:  [facility]  PHYSICIAN:  Madlyn Frankel. Charlann Boxer, M.D.  DATE OF BIRTH:  September 05, 1925  DATE OF ADMISSION:  07/09/2011 DATE OF DISCHARGE:                             HISTORY & PHYSICAL   ADMITTING DIAGNOSIS:  Right knee osteoarthritis.  HISTORY OF PRESENT ILLNESS:  This is an 77 year old lady with a history of osteoarthritis of bilateral knees with previous total knee arthroplasty in the left knee with good result, who is now scheduled for total knee arthroplasty of the right knee due to failure of conservative management. The surgery, risks, benefits, and aftercare were discussed in detail with the patient.  Questions invited and answered.  Note that she is a candidate for tranexamic acid and will get that preoperatively.  She will be going to a nursing facility postoperatively and her medical doctor is Dr. Lisabeth Pick.  PAST MEDICAL HISTORY:  Drug allergies, none.  CURRENT MEDICATIONS: 1. Multivitamin. 2. Caltrate with vitamin D. 3. Tylenol p.r.n. 4. Lisinopril. 5. Hydrochlorothiazide 20/25 mg 1 q.d. 6. Metoprolol 50 mg 1 b.i.d. 7. Metoprolol 25 mg 1 tablet b.i.d. along with the 50 mg tablet. 8. Aspirin 81 mg q.d. 9. Glucosamine. 10.Chondroitin. 11.Prilosec 40 mg 1 q.d. 12.Nystatin cream apply b.i.d. 13.Tramadol 50 mg q.6 p.r.n. pain. 14.Dicyclomine 10 mg 1-2 q.6 p.r.n. abdominal pain. 15.Caps eye vitamins b.i.d. 16.Fish oil 1 t.i.d.  SERIOUS MEDICAL ILLNESSES:  Include, hypertension and reflux.  PREVIOUS SURGERIES:  Include, lipoma excision from the neck, left knee arthroscopy, left total knee arthroplasty, bilateral cataracts, and appendectomy.  FAMILY HISTORY:  Positive for heart disease.  SOCIAL HISTORY:  The patient is widowed, she lives at home.  She does not smoke and does not drink.  REVIEW OF SYSTEMS:  CENTRAL NERVOUS SYSTEM:   Negative for headache, blurred vision, or dizziness.  PULMONARY:  Negative for shortness of breath, PND, and orthopnea.  CARDIOVASCULAR:  Negative for chest pain and palpitation.  GI:  Negative for ulcers and hepatitis.  Positive for reflux.  GU:  Negative for urinary tract difficulty.  MUSCULOSKELETAL: Positive in HPI.  PHYSICAL EXAM:  VITAL SIGNS:  BP 147/66, respirations 16, pulse 65 and regular. GENERAL APPEARANCE:  This is a well-developed and well-nourished lady in no acute distress. HEENT:  Head, normocephalic.  Nose, patent.  Ears, patent.  Pupils are equal, round, and reactive to light.  Throat without injection. NECK:  Supple without adenopathy.  Carotids 2+ without bruit. CHEST:  Clear to auscultation.  No rales or rhonchi.  Respirations 16. HEART:  Regular rate and rhythm at 65 beats per minute without murmur. ABDOMEN:  Soft, active bowel sounds.  No masses or organomegaly. NEUROLOGIC:  The patient alert and oriented to time, place, and person. Cranial nerves 2 through 12 grossly intact. EXTREMITIES:  Show the left knee status post total knee arthroplasty with good result.  Right knee has 5 degrees from full extension with further flexion to 110 degrees with pain.  Sensation and circulation are intact.  IMPRESSION:  Osteoarthritis, right knee.  PLAN:  Total knee arthroplasty, right knee.  [name], P.A.   ______________________________ Madlyn Frankel Charlann Boxer, M.D.    SJC/MEDQ  D:  07/30/2011  T:  07/30/2011  Job:  [id number]  Electronically Signed by Jodene Nam P.A. on 08/04/2011 07:33:59 AM Electronically Signed by Durene Romans M.D. on 08/04/2011 08:52:32 AM

## 2011-08-11 ENCOUNTER — Inpatient Hospital Stay (HOSPITAL_COMMUNITY)
Admission: RE | Admit: 2011-08-11 | Discharge: 2011-08-14 | DRG: 470 | Disposition: A | Discharge status: Skilled Nursing Facility | Payer: Medicare Other | Source: Ambulatory Visit | Attending: Orthopedic Surgery | Admitting: Orthopedic Surgery

## 2011-08-11 DIAGNOSIS — Z96659 Presence of unspecified artificial knee joint: Secondary | ICD-10-CM

## 2011-08-11 DIAGNOSIS — Z79899 Other long term (current) drug therapy: Secondary | ICD-10-CM

## 2011-08-11 DIAGNOSIS — Z01812 Encounter for preprocedural laboratory examination: Secondary | ICD-10-CM

## 2011-08-11 DIAGNOSIS — G5620 Lesion of ulnar nerve, unspecified upper limb: Secondary | ICD-10-CM | POA: Diagnosis present

## 2011-08-11 DIAGNOSIS — I10 Essential (primary) hypertension: Secondary | ICD-10-CM | POA: Diagnosis present

## 2011-08-11 DIAGNOSIS — M1710 Unilateral primary osteoarthritis, unspecified knee: Principal | ICD-10-CM | POA: Diagnosis present

## 2011-08-11 DIAGNOSIS — K219 Gastro-esophageal reflux disease without esophagitis: Secondary | ICD-10-CM | POA: Diagnosis present

## 2011-08-11 DIAGNOSIS — Z01810 Encounter for preprocedural cardiovascular examination: Secondary | ICD-10-CM

## 2011-08-11 DIAGNOSIS — Z7982 Long term (current) use of aspirin: Secondary | ICD-10-CM

## 2011-08-11 LAB — TYPE AND SCREEN
ABO/RH(D): A POS
Antibody Screen: NEGATIVE

## 2011-08-12 LAB — BASIC METABOLIC PANEL
BUN: 18 mg/dL (ref 6–23)
CO2: 25 mEq/L (ref 19–32)
Calcium: 8.5 mg/dL (ref 8.4–10.5)
Chloride: 105 mEq/L (ref 96–112)
Creatinine, Ser: 1.06 mg/dL (ref 0.50–1.10)
GFR calc Af Amer: 55 mL/min — ABNORMAL LOW (ref >90)
GFR calc non Af Amer: 47 mL/min — ABNORMAL LOW (ref >90)
Glucose, Bld: 124 mg/dL — ABNORMAL HIGH (ref 70–99)
Potassium: 4.3 mEq/L (ref 3.5–5.1)
Sodium: 137 mEq/L (ref 135–145)

## 2011-08-12 LAB — CBC
HCT: 32 % — ABNORMAL LOW (ref 36.0–46.0)
Hemoglobin: 10.7 g/dL — ABNORMAL LOW (ref 12.0–15.0)
MCH: 30.6 pg (ref 26.0–34.0)
MCHC: 33.4 g/dL (ref 30.0–36.0)
MCV: 91.4 fL (ref 78.0–100.0)
Platelets: 176 10*3/uL (ref 150–400)
RBC: 3.5 MIL/uL — ABNORMAL LOW (ref 3.87–5.11)
RDW: 13.7 % (ref 11.5–15.5)
WBC: 10.2 10*3/uL (ref 4.0–10.5)

## 2011-08-12 NOTE — Op Note (Signed)
NAMEMARYLEE, [name]                ACCOUNT NO.:  [id number]  MEDICAL RECORD NO.:  [id number]  LOCATION:  1608                         FACILITY:  [facility]  PHYSICIAN:  Caroline Frankel. Charlann Bryant, M.D.  DATE OF BIRTH:  16-May-1925  DATE OF PROCEDURE:  08/11/2011 DATE OF DISCHARGE:                              OPERATIVE REPORT   PREOPERATIVE DIAGNOSIS:  Right knee osteoarthritis.  POSTOPERATIVE DIAGNOSIS:  Right knee osteoarthritis.  PROCEDURE:  Right total knee replacement utilizing DePuy component, size 3 femur, 2.5 tibia, 10 mm insert, and 38 patellar button.  SURGEON:  Caroline Frankel. Charlann Bryant, M.D.  ASSISTANT:  Lanney Gins, PAC.  ANESTHESIA:  Spinal.  SPECIMENS:  None.  COMPLICATIONS:  None.  DRAINS:  One medium Hemovac.  TOURNIQUET TIME:  30 minutes at 250 mmHg.  BLOOD LOSS:  Minimal.  INDICATIONS FOR THE PROCEDURE:  Ms. Bryant is an 77 year old patient of mine with history of previous arthroplasties including a left total knee replacement.  She had presented to the office recently with progressive right knee pain.  She had failed conservative measures of medications, injection, and activity modification.  Radiographs revealed bone-on-bone changes with osteophyte formation.  Based on the results of her left total knee, she wished at this point to pursue more definitive measures.  Risks of infection, DVT, component failure, need for revision surgery for new reason including manipulation were all discussed.  Reviewed consent was obtained for benefit of pain relief.  PROCEDURE IN DETAIL:  The patient was brought to the operative theater. Once adequate anesthesia, preop antibiotics, Ancef administered, the patient was positioned supine with the right thigh tourniquet placed. The right lower extremity was then prepped and draped in sterile fashion.  Time-out was performed identifying the patient, planned procedure, and extremity.  Leg was exsanguinated, tourniquet elevated to 250  mmHg.  A midline incision was made followed by a median parapatellar arthrotomy.  Following initial exposure and debridement, 1st attention was directed to the patella, precut measurement was 23 mm.  I resected down to about 14 mm and used a 38 patellar button to restore height and cover the cut surface.  Lug holes were drilled and a metal shim placed to protect the patella from retractors and saw blades.  At this point with the femur exposed, the femoral canal was opened with a drill, irrigated to try to prevent fat emboli.  An intramedullary rod was passed at 3 degrees of valgus.  I resected 11 mm bone off the distal femur due to a preoperative flexion contracture.  Following this resection, the tibia was subluxated anteriorly.  The extramedullary guide was positioned and measured resection of 8 mm off the proximal lateral tibia was carried out.  Following this resection, we confirmed that the gap was stable medial and lateral with a 10 mm insert and that the knee came to full extension.  I then confirmed the cut was perpendicular in the coronal planes.  At this point, I sized the femur to be a size 3.  The size 3 rotation block was pinned into position.  Anterior reference off the anterior femur and the rotation set off the proximal tibia cut, which was confirmed to be perpendicular.  The four-in-one cutting block was pinned in position.  Anterior and posterior chamfer cuts were made without difficulty nor notching.  Final box cut was made off the lateral aspect of distal femur. Following this, the tibia was subluxated anteriorly.  The cut surfaces were best fit with size 2.5 tibial tray, which was pinned with the tray aligned through the medial 3rd of the tubercle.  The tibia was then drilled and keel punched.  A trial reduction was carried out with 3 femur, 2.5 tibia and a 10 mm insert.  With this, the knee came to full extension and was stable from extension to flexion with  nice balanced ligaments.  The patella tracked through the trochlea without application pressure.  Given these findings, the trial components removed.  At the synovial capsule junction, knee was injected with 0.25% Marcaine with epinephrine and 1 mL of Toradol.  Final components were opened.  Cement was mixed and the knee was irrigated with normal saline solution and pulse lavage. The final components were cemented onto clean and dried cut surfaces of bone.  The knee was brought to extension with a 10 mm trial insert. Extruded cement was removed.  Tourniquet was let down after 30 minutes without significant hemostasis required.  Once cement had cured, excess cement was removed throughout the knee.  A medium Hemovac drain was placed deep.  The extensor mechanism was then reapproximated using 1 Vicryl following irrigation.  The extensor mechanism was closed in flexion.  The remaining wound was closed with 2-0 Vicryl and a running 4-0 Monocryl.  The knee was cleaned, dried, and dressed sterilely using Dermabond and Aquacel dressing.  Drain site dressed separately.  The patient was then brought to recovery room in stable condition tolerating the procedure well.  Please note that physician assistant, Lanney Gins was present for the entire case and utilized for all portions from preoperative position, perioperative retractor management and general facilitation of the case. He was also used for primary wound closure.     [name], M.D.     MDO/MEDQ  D:  08/11/2011  T:  08/11/2011  Job:  [id number]  Electronically Signed by Durene Romans M.D. on 08/12/2011 11:01:22 AM

## 2011-08-13 LAB — CBC
HCT: 31 % — ABNORMAL LOW (ref 36.0–46.0)
Hemoglobin: 10.4 g/dL — ABNORMAL LOW (ref 12.0–15.0)
MCH: 30.5 pg (ref 26.0–34.0)
MCHC: 33.5 g/dL (ref 30.0–36.0)
MCV: 90.9 fL (ref 78.0–100.0)
Platelets: 180 10*3/uL (ref 150–400)
RBC: 3.41 MIL/uL — ABNORMAL LOW (ref 3.87–5.11)
RDW: 13.6 % (ref 11.5–15.5)
WBC: 8.6 10*3/uL (ref 4.0–10.5)

## 2011-08-13 LAB — BASIC METABOLIC PANEL
BUN: 15 mg/dL (ref 6–23)
CO2: 26 mEq/L (ref 19–32)
Calcium: 9.1 mg/dL (ref 8.4–10.5)
Chloride: 104 mEq/L (ref 96–112)
Creatinine, Ser: 0.84 mg/dL (ref 0.50–1.10)
GFR calc Af Amer: 72 mL/min — ABNORMAL LOW (ref >90)
GFR calc non Af Amer: 63 mL/min — ABNORMAL LOW (ref >90)
Glucose, Bld: 117 mg/dL — ABNORMAL HIGH (ref 70–99)
Potassium: 4.2 mEq/L (ref 3.5–5.1)
Sodium: 135 mEq/L (ref 135–145)

## 2011-08-21 NOTE — Discharge Summary (Signed)
NAMEBRITTNI, [name]                ACCOUNT NO.:  [id number]  MEDICAL RECORD NO.:  [id number]  LOCATION:  1608                         FACILITY:  [facility]  PHYSICIAN:  Madlyn Frankel. Charlann Boxer, M.D.  DATE OF BIRTH:  05-May-1925  DATE OF ADMISSION:  08/11/2011 DATE OF DISCHARGE:  08/14/2011                        DISCHARGE SUMMARY - REFERRING   PROCEDURE:  Right total knee replacement.  ATTENDING PHYSICIAN:  Madlyn Frankel. Charlann Boxer, M.D.  ADMITTING DIAGNOSIS:  Right knee osteoarthritis.  DISCHARGE DIAGNOSES: 1. Status post right total knee arthroplasty. 2. Hypertension. 3. Reflux.  HISTORY OF PRESENT ILLNESS:  Patient is an 77 year old lady with history of osteoarthritis of bilateral knees with previous total knee arthroplasty on the left.  Patient has had good results with the left knee.  She now wishes to proceed with the right knee arthroplasty.  X- rays in the clinic do show osteoarthritis of the right knee.  Patient has failed conservative treatments of the right knee.  Various options were discussed.  Risks, benefits, and expectation of procedure were discussed with the patient.  Patient understands risks, benefits, and expectations and wishes to proceed with right total knee arthroplasty per Dr. Charlann Boxer.  HOSPITAL COURSE:  Patient underwent the above-stated procedure on August 11, 2011.  The patient tolerated the procedure well, was brought to the recovery room in good condition, and subsequently to the floor.  Postop day 1, August 12, 2011, the patient doing well, no events, pain is well-controlled, afebrile,  H and H 10.7/32.0.  She is distally neurovascularly intact.  Dressing good, clean, dry, and intact.  Patient did well with physical therapy and occupational therapy.  Postop day 2, August 13, 2011, the patient doing well, no events, afebrile, hematocrit was 31.  Right knee dressing was dry.  Patient did well again with physical therapy.  Postop day 3, August 14, 2011, the  patient doing well, no events, afebrile.  Pain is well-controlled.  H and H 10.4/31.0, distally neurovascularly intact.  Dressing is good, clean, dry, and intact. Patient had physical therapy x2 and was felt to be doing well enough to be discharged to skilled nursing facility today.  DISCHARGE CONDITION:  Good.  DISCHARGE INSTRUCTIONS:  Patient will be discharged to skilled nursing facility today on August 14, 2011 after having 2 sessions of physical therapy in the hospital.  The patient maintain her surgical dressing for about 8 days after which time she will replace with gauze and tape. Patient to keep the area dry and clean until followup.  Patient to follow up in 2 weeks at [facility] with Dr. Charlann Boxer.  Patient knows to call with any questions or concerns.  DISCHARGE MEDICATIONS: 1. Aspirin enteric-coated 325 mg one p.o. b.i.d. x4 weeks. 2. Benadryl 25 mg one p.o. q.4 hours p.r.n. 3. Colace 100 mg one p.o. b.i.d. constipation. 4. Iron sulfate 325 mg one p.o. t.i.d. x2-3 weeks. 5. Norco 7.5/325 1-2 p.o. q.4-6 hours p.r.n. pain. 6. Robaxin 500 mg one p.o. q.6 hours p.r.n. muscle spasms. 7. MiraLAX 17 g one p.o. b.i.d. constipation. 8. Calcium carbonate/vitamin D 600 mg/400 units one p.o. b.i.d. 9. Dicyclomine 10 mg one p.o. q.6 hours for cramps. 10.Fish oil 1000 mg one  capsule in the morning and 2 capsules at     night. 11.Glucosamine sulfate 500 mg one p.o. b.i.d. 12.I-Caps vitamins one p.o. b.i.d. 13.Lisinopril/HCTZ 20/25 mg one p.o. q.a.m. 14.Metoprolol tartrate 25 mg one p.o. b.i.d. 15.Metoprolol tartrate 50 mg one p.o. b.i.d. 16.Multivitamin one p.o. q. day. 17.Omeprazole 40 mg one p.o. q.a.m.    ______________________________ Lanney Gins, PA   ______________________________ Madlyn Frankel. Charlann Boxer, M.D.    MB/MEDQ  D:  08/14/2011  T:  08/14/2011  Job:  [id number]  Electronically Signed by Lanney Gins PA on 08/19/2011 02:15:31 PM Electronically Signed by  Durene Romans M.D. on 08/21/2011 11:13:12 AM

## 2011-10-24 DIAGNOSIS — M1710 Unilateral primary osteoarthritis, unspecified knee: Secondary | ICD-10-CM | POA: Diagnosis not present

## 2011-10-24 DIAGNOSIS — IMO0002 Reserved for concepts with insufficient information to code with codable children: Secondary | ICD-10-CM | POA: Diagnosis not present

## 2011-10-28 DIAGNOSIS — M1710 Unilateral primary osteoarthritis, unspecified knee: Secondary | ICD-10-CM | POA: Diagnosis not present

## 2011-10-28 DIAGNOSIS — IMO0002 Reserved for concepts with insufficient information to code with codable children: Secondary | ICD-10-CM | POA: Diagnosis not present

## 2011-10-30 DIAGNOSIS — IMO0002 Reserved for concepts with insufficient information to code with codable children: Secondary | ICD-10-CM | POA: Diagnosis not present

## 2011-10-30 DIAGNOSIS — M1710 Unilateral primary osteoarthritis, unspecified knee: Secondary | ICD-10-CM | POA: Diagnosis not present

## 2011-11-03 DIAGNOSIS — IMO0002 Reserved for concepts with insufficient information to code with codable children: Secondary | ICD-10-CM | POA: Diagnosis not present

## 2011-11-03 DIAGNOSIS — M1710 Unilateral primary osteoarthritis, unspecified knee: Secondary | ICD-10-CM | POA: Diagnosis not present

## 2011-11-06 ENCOUNTER — Telehealth: Payer: Self-pay | Admitting: Family Medicine

## 2011-11-06 DIAGNOSIS — E785 Hyperlipidemia, unspecified: Secondary | ICD-10-CM

## 2011-11-06 DIAGNOSIS — M899 Disorder of bone, unspecified: Secondary | ICD-10-CM

## 2011-11-06 DIAGNOSIS — I10 Essential (primary) hypertension: Secondary | ICD-10-CM

## 2011-11-06 NOTE — Telephone Encounter (Signed)
Message copied by Excell Seltzer on Thu Nov 06, 2011 10:10 PM ------      Message from: Baldomero Lamy      Created: Thu Oct 30, 2011  7:28 AM      Regarding: Cpx labs Fri 1/18       Please order  future cpx labs for pt's upcomming lab appt.      Thanks      Rodney Booze

## 2011-11-07 ENCOUNTER — Other Ambulatory Visit: Payer: Medicare Other

## 2011-11-11 ENCOUNTER — Other Ambulatory Visit (INDEPENDENT_AMBULATORY_CARE_PROVIDER_SITE_OTHER): Payer: Medicare Other

## 2011-11-11 DIAGNOSIS — E785 Hyperlipidemia, unspecified: Secondary | ICD-10-CM | POA: Diagnosis not present

## 2011-11-11 DIAGNOSIS — M899 Disorder of bone, unspecified: Secondary | ICD-10-CM | POA: Diagnosis not present

## 2011-11-11 DIAGNOSIS — I10 Essential (primary) hypertension: Secondary | ICD-10-CM

## 2011-11-11 DIAGNOSIS — M949 Disorder of cartilage, unspecified: Secondary | ICD-10-CM | POA: Diagnosis not present

## 2011-11-11 LAB — COMPREHENSIVE METABOLIC PANEL
ALT: 19 U/L (ref 0–35)
AST: 20 U/L (ref 0–37)
Albumin: 3.9 g/dL (ref 3.5–5.2)
Alkaline Phosphatase: 59 U/L (ref 39–117)
BUN: 23 mg/dL (ref 6–23)
CO2: 29 mEq/L (ref 19–32)
Calcium: 10.1 mg/dL (ref 8.4–10.5)
Chloride: 104 mEq/L (ref 96–112)
Creatinine, Ser: 0.8 mg/dL (ref 0.4–1.2)
GFR: 73.67 mL/min (ref >60.00)
Glucose, Bld: 106 mg/dL — ABNORMAL HIGH (ref 70–99)
Potassium: 4.1 mEq/L (ref 3.5–5.1)
Sodium: 140 mEq/L (ref 135–145)
Total Bilirubin: 0.3 mg/dL (ref 0.3–1.2)
Total Protein: 7.5 g/dL (ref 6.0–8.3)

## 2011-11-11 LAB — LIPID PANEL
Cholesterol: 214 mg/dL — ABNORMAL HIGH (ref 0–200)
HDL: 34.8 mg/dL — ABNORMAL LOW (ref >39.00)
Total CHOL/HDL Ratio: 6
Triglycerides: 264 mg/dL — ABNORMAL HIGH (ref 0.0–149.0)
VLDL: 52.8 mg/dL — ABNORMAL HIGH (ref 0.0–40.0)

## 2011-11-11 LAB — LDL CHOLESTEROL, DIRECT: Direct LDL: 123.2 mg/dL

## 2011-11-12 LAB — VITAMIN D 25 HYDROXY (VIT D DEFICIENCY, FRACTURES): Vit D, 25-Hydroxy: 57 ng/mL (ref 30–89)

## 2011-11-14 ENCOUNTER — Encounter: Payer: Medicare Other | Admitting: Family Medicine

## 2011-11-19 ENCOUNTER — Other Ambulatory Visit: Payer: Self-pay | Admitting: Family Medicine

## 2011-11-19 MED ORDER — METOPROLOL TARTRATE 50 MG PO TABS: 50.0000 mg | ORAL_TABLET | Freq: Two times a day (BID) | ORAL | Status: DC

## 2011-11-19 MED ORDER — METOPROLOL TARTRATE 25 MG PO TABS: ORAL_TABLET | ORAL | Status: DC

## 2011-11-19 NOTE — Telephone Encounter (Signed)
Pt walk in, need refills for Metoprolol Tart Tab 25mg  and Metoprolol Tart tab 50mg . Send to [facility].... Call back # for pt [phone]

## 2011-11-28 ENCOUNTER — Telehealth: Payer: Self-pay | Admitting: Family Medicine

## 2011-11-28 MED ORDER — SCOPOLAMINE 1 MG/3DAYS TD PT72: 1.0000 | MEDICATED_PATCH | TRANSDERMAL | Status: DC

## 2011-11-28 NOTE — Telephone Encounter (Signed)
Ok to call in scopolamine patches. Disp 1 box. Change every 72 hours. 0 refills

## 2011-11-28 NOTE — Telephone Encounter (Signed)
Patient advised.

## 2011-11-28 NOTE — Telephone Encounter (Signed)
Patient is going on a Cruise on Tuesday.  Her daughter wants to know if a rx can be called in for the patch for motion sickness.  She uses [facility].  Please call patient's daughter when rx is ready.

## 2011-12-01 DIAGNOSIS — H6120 Impacted cerumen, unspecified ear: Secondary | ICD-10-CM | POA: Diagnosis not present

## 2011-12-01 DIAGNOSIS — H93299 Other abnormal auditory perceptions, unspecified ear: Secondary | ICD-10-CM | POA: Diagnosis not present

## 2011-12-01 DIAGNOSIS — H9319 Tinnitus, unspecified ear: Secondary | ICD-10-CM | POA: Diagnosis not present

## 2011-12-01 DIAGNOSIS — H9110 Presbycusis, unspecified ear: Secondary | ICD-10-CM | POA: Diagnosis not present

## 2011-12-17 DIAGNOSIS — H43819 Vitreous degeneration, unspecified eye: Secondary | ICD-10-CM | POA: Diagnosis not present

## 2011-12-17 DIAGNOSIS — H353290 Exudative age-related macular degeneration, unspecified eye, stage unspecified: Secondary | ICD-10-CM | POA: Diagnosis not present

## 2011-12-17 DIAGNOSIS — H35359 Cystoid macular degeneration, unspecified eye: Secondary | ICD-10-CM | POA: Diagnosis not present

## 2011-12-17 DIAGNOSIS — H353190 Nonexudative age-related macular degeneration, unspecified eye, stage unspecified: Secondary | ICD-10-CM | POA: Diagnosis not present

## 2012-01-06 DIAGNOSIS — H2510 Age-related nuclear cataract, unspecified eye: Secondary | ICD-10-CM | POA: Diagnosis not present

## 2012-03-11 DIAGNOSIS — L570 Actinic keratosis: Secondary | ICD-10-CM | POA: Diagnosis not present

## 2012-03-11 DIAGNOSIS — L259 Unspecified contact dermatitis, unspecified cause: Secondary | ICD-10-CM | POA: Diagnosis not present

## 2012-03-11 DIAGNOSIS — D485 Neoplasm of uncertain behavior of skin: Secondary | ICD-10-CM | POA: Diagnosis not present

## 2012-03-11 DIAGNOSIS — D235 Other benign neoplasm of skin of trunk: Secondary | ICD-10-CM | POA: Diagnosis not present

## 2012-03-19 ENCOUNTER — Telehealth: Payer: Self-pay | Admitting: Family Medicine

## 2012-03-19 MED ORDER — OMEPRAZOLE 40 MG PO CPDR: 40.0000 mg | DELAYED_RELEASE_CAPSULE | Freq: Every day | ORAL | Status: DC

## 2012-03-19 MED ORDER — LISINOPRIL-HYDROCHLOROTHIAZIDE 20-25 MG PO TABS: 1.0000 | ORAL_TABLET | Freq: Every day | ORAL | Status: DC

## 2012-03-19 NOTE — Telephone Encounter (Signed)
Pending appointment 05/06/12, previous OV 07/24/11

## 2012-03-19 NOTE — Telephone Encounter (Signed)
Patient is requesting a refill of lisinopril and omeprazole to be sent to [facility]. Patient is requesting a year supply.

## 2012-04-08 DIAGNOSIS — D485 Neoplasm of uncertain behavior of skin: Secondary | ICD-10-CM | POA: Diagnosis not present

## 2012-04-08 DIAGNOSIS — L259 Unspecified contact dermatitis, unspecified cause: Secondary | ICD-10-CM | POA: Diagnosis not present

## 2012-04-08 DIAGNOSIS — L570 Actinic keratosis: Secondary | ICD-10-CM | POA: Diagnosis not present

## 2012-04-29 ENCOUNTER — Encounter: Payer: Medicare Other | Admitting: Family Medicine

## 2012-05-06 ENCOUNTER — Encounter: Payer: Self-pay | Admitting: Family Medicine

## 2012-05-06 ENCOUNTER — Ambulatory Visit (INDEPENDENT_AMBULATORY_CARE_PROVIDER_SITE_OTHER): Payer: Medicare Other | Admitting: Family Medicine

## 2012-05-06 VITALS — BP 122/68 | HR 67 | Temp 97.8°F | Ht 64.0 in | Wt 156.8 lb

## 2012-05-06 DIAGNOSIS — Z0000 Encounter for general adult medical examination without abnormal findings: Secondary | ICD-10-CM | POA: Diagnosis not present

## 2012-05-06 DIAGNOSIS — E785 Hyperlipidemia, unspecified: Secondary | ICD-10-CM

## 2012-05-06 DIAGNOSIS — I10 Essential (primary) hypertension: Secondary | ICD-10-CM

## 2012-05-06 DIAGNOSIS — M899 Disorder of bone, unspecified: Secondary | ICD-10-CM

## 2012-05-06 DIAGNOSIS — R7309 Other abnormal glucose: Secondary | ICD-10-CM

## 2012-05-06 DIAGNOSIS — K219 Gastro-esophageal reflux disease without esophagitis: Secondary | ICD-10-CM

## 2012-05-06 DIAGNOSIS — M949 Disorder of cartilage, unspecified: Secondary | ICD-10-CM

## 2012-05-06 NOTE — Progress Notes (Signed)
Subjective:    Patient ID: [name], female    DOB: 1925-07-18, 78 y.o.   MRN: [id number]  HPI  I have personally reviewed the Medicare Annual Wellness questionnaire and have noted 1. The patient's medical and social history 2. Their use of alcohol, tobacco or illicit drugs 3. Their current medications and supplements 4. The patient's functional ability including ADL's, fall risks, home safety risks and hearing or visual             impairment. 5. Diet and physical activities 6. Evidence for depression or mood disorders The patients weight, height, BMI and visual acuity have been recorded in the chart I have made referrals, counseling and provided education to the patient based review of the above and I have provided the pt with a written personalized care plan for preventive services.  Hypertension:  On metoprolol, lisinopril/HCTZ  Using medication without problems or lightheadedness:  none Chest pain with exertion:None Edema:None Short of breath:some with walking Average home BPs:Not checking Other issues:  Elevated Cholesterol: Well controlled at goal on no med. Lab Results  Component Value Date   CHOL 214* 11/11/2011   HDL 34.80* 11/11/2011   LDLCALC 128* 06/12/2009   LDLDIRECT 123.2 11/11/2011   TRIG 264.0* 11/11/2011   CHOLHDL 6 11/11/2011  Diet compliance: good Exercise: Designer, television/film set... Other complaints:  Prediabetes:  Improved some at last check in 10/2011  Right knee surgery in 07/2012. Left knee pain now hurting, followed by Ortho. Rarely using vicodin for pain.   Review of Systems  Constitutional: Negative for fever, fatigue and unexpected weight change.  HENT: Negative for ear pain, congestion, sore throat, sneezing, trouble swallowing and sinus pressure.   Eyes: Negative for pain and itching.  Respiratory: Negative for cough, shortness of breath and wheezing.   Cardiovascular: Negative for chest pain, palpitations and leg swelling.  Gastrointestinal:  Negative for nausea, abdominal pain, diarrhea, constipation and blood in stool.       GERD, intermittantly usingn omeprazole  Genitourinary: Positive for frequency. Negative for dysuria, hematuria, vaginal discharge and difficulty urinating.  Skin: Negative for rash.  Neurological: Negative for syncope, weakness, light-headedness, numbness and headaches.       No recent falls  Psychiatric/Behavioral: Negative for confusion and dysphoric mood. The patient is not nervous/anxious.        Objective:   Physical Exam  Constitutional: Vital signs are normal. She appears well-developed and well-nourished. She is cooperative.  Non-toxic appearance. She does not appear ill. No distress.       Overweight, elderly  HENT:  Head: Normocephalic.  Right Ear: Hearing, tympanic membrane, external ear and ear canal normal.  Left Ear: Hearing, tympanic membrane, external ear and ear canal normal.  Nose: Nose normal.  Eyes: Conjunctivae, EOM and lids are normal. Pupils are equal, round, and reactive to light. No foreign bodies found.  Neck: Trachea normal and normal range of motion. Neck supple. Carotid bruit is not present. No mass and no thyromegaly present.  Cardiovascular: Normal rate, regular rhythm, S1 normal, S2 normal, normal heart sounds and intact distal pulses.  Exam reveals no gallop.   No murmur heard. Pulmonary/Chest: Effort normal and breath sounds normal. No respiratory distress. She has no wheezes. She has no rhonchi. She has no rales.  Abdominal: Soft. Normal appearance and bowel sounds are normal. She exhibits no distension, no fluid wave, no abdominal bruit and no mass. There is no hepatosplenomegaly. There is no tenderness. There is no rebound, no guarding and no CVA  tenderness. No hernia.  Lymphadenopathy:    She has no cervical adenopathy.    She has no axillary adenopathy.  Neurological: She is alert. She has normal strength. No cranial nerve deficit or sensory deficit.  Skin: Skin is  warm, dry and intact. No rash noted.  Psychiatric: Her speech is normal and behavior is normal. Judgment normal. Her mood appears not anxious. Cognition and memory are normal. She does not exhibit a depressed mood.          Assessment & Plan:  The patient's preventative maintenance and recommended screening tests for an annual wellness exam were reviewed in full today. Brought up to date unless services declined.  Counselled on the importance of diet, exercise, and its role in overall health and mortality. The patient's FH and SH was reviewed, including their home life, tobacco status, and drug and alcohol status.   Vaccines:Uptodate with vaccines Td, PNA and zostavax Colon: incomplete colonoscopy 2009.Marland Kitchen Repeat prn only. Pap/DVE not indicated. Mammo: likes to continue, sister and daughter with breast cancer. Non smoker DEXA: last in 2010 osteopenia, due this year.

## 2012-05-06 NOTE — Assessment & Plan Note (Signed)
Restart fish oil and take regularly. Encouraged exercise, weight loss, healthy eating habits.

## 2012-05-06 NOTE — Assessment & Plan Note (Signed)
Vit D in nml range. Due for repeat DEXA.

## 2012-05-06 NOTE — Patient Instructions (Addendum)
Try to increase exercise some.. Walking. Work on low fast, low carbohydrate.  Try to take 3-4 fish oil capsules a day to help lower triglycerides.  Call to schedule mammogram, can look into scheduling bone density at the same time.

## 2012-05-06 NOTE — Assessment & Plan Note (Addendum)
Follow up with GI if GERD not improving with diet changes and prilosec.

## 2012-05-06 NOTE — Assessment & Plan Note (Signed)
Well controlled. Continue current medication. Encouraged exercise, weight loss, healthy eating habits.  

## 2012-05-06 NOTE — Assessment & Plan Note (Signed)
Work on low carb diet and lifestyle change.

## 2012-06-01 ENCOUNTER — Other Ambulatory Visit: Payer: Self-pay | Admitting: Family Medicine

## 2012-06-01 DIAGNOSIS — Z1231 Encounter for screening mammogram for malignant neoplasm of breast: Secondary | ICD-10-CM

## 2012-06-16 DIAGNOSIS — H353290 Exudative age-related macular degeneration, unspecified eye, stage unspecified: Secondary | ICD-10-CM | POA: Diagnosis not present

## 2012-06-16 DIAGNOSIS — H31019 Macula scars of posterior pole (postinflammatory) (post-traumatic), unspecified eye: Secondary | ICD-10-CM | POA: Diagnosis not present

## 2012-06-16 DIAGNOSIS — H353190 Nonexudative age-related macular degeneration, unspecified eye, stage unspecified: Secondary | ICD-10-CM | POA: Diagnosis not present

## 2012-06-30 ENCOUNTER — Other Ambulatory Visit: Payer: Self-pay

## 2012-06-30 MED ORDER — METOPROLOL TARTRATE 50 MG PO TABS: 50.0000 mg | ORAL_TABLET | Freq: Two times a day (BID) | ORAL | Status: DC

## 2012-06-30 MED ORDER — METOPROLOL TARTRATE 25 MG PO TABS: ORAL_TABLET | ORAL | Status: DC

## 2012-06-30 MED ORDER — OMEPRAZOLE 40 MG PO CPDR: 40.0000 mg | DELAYED_RELEASE_CAPSULE | Freq: Every day | ORAL | Status: DC

## 2012-06-30 MED ORDER — LISINOPRIL-HYDROCHLOROTHIAZIDE 20-25 MG PO TABS: 1.0000 | ORAL_TABLET | Freq: Every day | ORAL | Status: DC

## 2012-06-30 NOTE — Telephone Encounter (Signed)
Pt request refills metoprolol 25 mg and 50 mg, omeprazole and lisinopril-HCTZ to [facility]. Pt notified done.

## 2012-07-07 ENCOUNTER — Ambulatory Visit
Admission: RE | Admit: 2012-07-07 | Discharge: 2012-07-07 | Disposition: A | Discharge status: Home / Self Care | Payer: Medicare Other | Source: Ambulatory Visit | Attending: Family Medicine | Admitting: Family Medicine

## 2012-07-07 DIAGNOSIS — Z1231 Encounter for screening mammogram for malignant neoplasm of breast: Secondary | ICD-10-CM | POA: Diagnosis not present

## 2012-07-08 ENCOUNTER — Encounter: Payer: Self-pay | Admitting: Family Medicine

## 2012-07-08 LAB — HM MAMMOGRAPHY: HM Mammogram: NORMAL

## 2012-07-22 DIAGNOSIS — Z23 Encounter for immunization: Secondary | ICD-10-CM | POA: Diagnosis not present

## 2012-08-25 DIAGNOSIS — M1710 Unilateral primary osteoarthritis, unspecified knee: Secondary | ICD-10-CM | POA: Diagnosis not present

## 2012-08-25 DIAGNOSIS — IMO0002 Reserved for concepts with insufficient information to code with codable children: Secondary | ICD-10-CM | POA: Diagnosis not present

## 2012-08-26 ENCOUNTER — Other Ambulatory Visit (HOSPITAL_COMMUNITY): Payer: Self-pay | Admitting: Orthopedic Surgery

## 2012-08-26 DIAGNOSIS — T84033A Mechanical loosening of internal left knee prosthetic joint, initial encounter: Secondary | ICD-10-CM

## 2012-08-26 DIAGNOSIS — M25562 Pain in left knee: Secondary | ICD-10-CM

## 2012-09-13 ENCOUNTER — Encounter (HOSPITAL_COMMUNITY)
Admission: RE | Admit: 2012-09-13 | Discharge: 2012-09-13 | Disposition: A | Discharge status: Home / Self Care | Payer: Medicare Other | Source: Ambulatory Visit | Attending: Orthopedic Surgery | Admitting: Orthopedic Surgery

## 2012-09-13 ENCOUNTER — Encounter (HOSPITAL_COMMUNITY): Payer: Self-pay

## 2012-09-13 DIAGNOSIS — M25569 Pain in unspecified knee: Secondary | ICD-10-CM | POA: Diagnosis not present

## 2012-09-13 DIAGNOSIS — Z96659 Presence of unspecified artificial knee joint: Secondary | ICD-10-CM | POA: Diagnosis not present

## 2012-09-13 DIAGNOSIS — T84033A Mechanical loosening of internal left knee prosthetic joint, initial encounter: Secondary | ICD-10-CM

## 2012-09-13 DIAGNOSIS — M25562 Pain in left knee: Secondary | ICD-10-CM

## 2012-09-13 MED ORDER — TECHNETIUM TC 99M MEDRONATE IV KIT
20.9000 | PACK | Freq: Once | INTRAVENOUS | Status: AC | PRN
  Administered 2012-09-13: 20.9 via INTRAVENOUS

## 2012-10-27 ENCOUNTER — Telehealth: Payer: Self-pay | Admitting: Family Medicine

## 2012-10-27 DIAGNOSIS — R7309 Other abnormal glucose: Secondary | ICD-10-CM

## 2012-10-27 DIAGNOSIS — E785 Hyperlipidemia, unspecified: Secondary | ICD-10-CM

## 2012-10-27 DIAGNOSIS — I10 Essential (primary) hypertension: Secondary | ICD-10-CM

## 2012-10-27 DIAGNOSIS — M899 Disorder of bone, unspecified: Secondary | ICD-10-CM

## 2012-10-27 NOTE — Telephone Encounter (Signed)
Message copied by Excell Seltzer on Wed Oct 27, 2012 11:07 PM ------      Message from: Baldomero Lamy      Created: Tue Oct 26, 2012  1:13 PM      Regarding: 6 mo f/u labs Thurs 1/9       Please order  future f/u labs for pt's upcomming lab appt.      Thanks      Rodney Booze

## 2012-10-28 ENCOUNTER — Other Ambulatory Visit (INDEPENDENT_AMBULATORY_CARE_PROVIDER_SITE_OTHER): Payer: Medicare Other

## 2012-10-28 DIAGNOSIS — I10 Essential (primary) hypertension: Secondary | ICD-10-CM

## 2012-10-28 DIAGNOSIS — E785 Hyperlipidemia, unspecified: Secondary | ICD-10-CM | POA: Diagnosis not present

## 2012-10-28 DIAGNOSIS — M949 Disorder of cartilage, unspecified: Secondary | ICD-10-CM

## 2012-10-28 DIAGNOSIS — M899 Disorder of bone, unspecified: Secondary | ICD-10-CM | POA: Diagnosis not present

## 2012-10-28 LAB — LIPID PANEL
Cholesterol: 194 mg/dL (ref 0–200)
HDL: 26.6 mg/dL — ABNORMAL LOW (ref >39.00)
Total CHOL/HDL Ratio: 7
Triglycerides: 203 mg/dL — ABNORMAL HIGH (ref 0.0–149.0)
VLDL: 40.6 mg/dL — ABNORMAL HIGH (ref 0.0–40.0)

## 2012-10-28 LAB — COMPREHENSIVE METABOLIC PANEL
ALT: 14 U/L (ref 0–35)
AST: 19 U/L (ref 0–37)
Albumin: 3.7 g/dL (ref 3.5–5.2)
Alkaline Phosphatase: 46 U/L (ref 39–117)
BUN: 22 mg/dL (ref 6–23)
CO2: 29 mEq/L (ref 19–32)
Calcium: 9.9 mg/dL (ref 8.4–10.5)
Chloride: 104 mEq/L (ref 96–112)
Creatinine, Ser: 1 mg/dL (ref 0.4–1.2)
GFR: 58.7 mL/min — ABNORMAL LOW (ref >60.00)
Glucose, Bld: 104 mg/dL — ABNORMAL HIGH (ref 70–99)
Potassium: 4 mEq/L (ref 3.5–5.1)
Sodium: 139 mEq/L (ref 135–145)
Total Bilirubin: 0.7 mg/dL (ref 0.3–1.2)
Total Protein: 7.4 g/dL (ref 6.0–8.3)

## 2012-10-28 LAB — LDL CHOLESTEROL, DIRECT: Direct LDL: 112.5 mg/dL

## 2012-10-29 LAB — VITAMIN D 25 HYDROXY (VIT D DEFICIENCY, FRACTURES): Vit D, 25-Hydroxy: 70 ng/mL (ref 30–89)

## 2012-11-02 ENCOUNTER — Encounter: Payer: Self-pay | Admitting: Family Medicine

## 2012-11-02 ENCOUNTER — Ambulatory Visit (INDEPENDENT_AMBULATORY_CARE_PROVIDER_SITE_OTHER): Payer: Medicare Other | Admitting: Family Medicine

## 2012-11-02 VITALS — BP 130/80 | HR 65 | Temp 98.2°F | Ht 64.0 in | Wt 159.0 lb

## 2012-11-02 DIAGNOSIS — I10 Essential (primary) hypertension: Secondary | ICD-10-CM

## 2012-11-02 DIAGNOSIS — E785 Hyperlipidemia, unspecified: Secondary | ICD-10-CM | POA: Diagnosis not present

## 2012-11-02 DIAGNOSIS — R7309 Other abnormal glucose: Secondary | ICD-10-CM | POA: Diagnosis not present

## 2012-11-02 DIAGNOSIS — K219 Gastro-esophageal reflux disease without esophagitis: Secondary | ICD-10-CM

## 2012-11-02 MED ORDER — DICYCLOMINE HCL 10 MG PO CAPS: 10.0000 mg | ORAL_CAPSULE | Freq: Three times a day (TID) | ORAL | Status: DC

## 2012-11-02 NOTE — Assessment & Plan Note (Addendum)
Improved control LDL and trigs. Take fish oil more regularly.

## 2012-11-02 NOTE — Assessment & Plan Note (Signed)
Well controlled. Continue current medication.  

## 2012-11-02 NOTE — Patient Instructions (Addendum)
Try to take fish oil ( flax seed) 2000 mg divided daily.  Work on healthy eating habits, exercise and weight loss.

## 2012-11-02 NOTE — Progress Notes (Signed)
  Subjective:    Patient ID: [name], female    DOB: 06-30-1925, 79 y.o.   MRN: [id number]  HPI  79 year old female presents for 6 month followup.  Hypertension:  Well controlled on metoprolol, lisinopril/HCTZ  Using medication without problems or lightheadedness: None Chest pain with exertion:None Edema:None Short of breath: Some mild SOb with walking up hill.. The same as past few years. Average home BPs:Not checking. Other issues: Wt Readings from Last 3 Encounters:  11/02/12 159 lb (72.122 kg)  05/06/12 156 lb 12 oz (71.101 kg)  06/12/11 154 lb 12 oz (70.194 kg)     Elevated Cholesterol:  LDL at goal <130 on no medication except fish oil... Takes off and on. Trigs and HDL still high but trig better than last year. Lab Results  Component Value Date   CHOL 194 10/28/2012   HDL 26.60* 10/28/2012   LDLCALC 128* 06/12/2009   LDLDIRECT 112.5 10/28/2012   TRIG 203.0* 10/28/2012   CHOLHDL 7 10/28/2012   Diet compliance: moderate Exercise: Walking Other complaints:  Prediabetes, improving.  Knee pain doing fairly well. Foillowed by ortho. Using ? pain pill rarely.    Review of Systems  Constitutional: Negative for fever and fatigue.  HENT: Negative for ear pain.   Eyes: Negative for pain.  Respiratory: Negative for chest tightness and shortness of breath.   Cardiovascular: Negative for chest pain, palpitations and leg swelling.  Gastrointestinal: Negative for abdominal pain.  Genitourinary: Negative for dysuria.       Objective:   Physical Exam  Constitutional: Vital signs are normal. She appears well-developed and well-nourished. She is cooperative.  Non-toxic appearance. She does not appear ill. No distress.       Elderly   HENT:  Head: Normocephalic.  Right Ear: Hearing, tympanic membrane, external ear and ear canal normal. Tympanic membrane is not erythematous, not retracted and not bulging.  Left Ear: Hearing, tympanic membrane, external ear and ear canal  normal. Tympanic membrane is not erythematous, not retracted and not bulging.  Nose: No mucosal edema or rhinorrhea. Right sinus exhibits no maxillary sinus tenderness and no frontal sinus tenderness. Left sinus exhibits no maxillary sinus tenderness and no frontal sinus tenderness.  Mouth/Throat: Uvula is midline, oropharynx is clear and moist and mucous membranes are normal.  Eyes: Conjunctivae normal, EOM and lids are normal. Pupils are equal, round, and reactive to light. No foreign bodies found.  Neck: Trachea normal and normal range of motion. Neck supple. Carotid bruit is not present. No mass and no thyromegaly present.  Cardiovascular: Normal rate, regular rhythm, S1 normal, S2 normal, normal heart sounds, intact distal pulses and normal pulses.  Exam reveals no gallop and no friction rub.   No murmur heard. Pulmonary/Chest: Effort normal and breath sounds normal. Not tachypneic. No respiratory distress. She has no decreased breath sounds. She has no wheezes. She has no rhonchi. She has no rales.  Abdominal: Soft. Normal appearance and bowel sounds are normal. There is no tenderness.  Neurological: She is alert.  Skin: Skin is warm, dry and intact. No rash noted.  Psychiatric: Her speech is normal and behavior is normal. Judgment and thought content normal. Her mood appears not anxious. Cognition and memory are normal. She does not exhibit a depressed mood.          Assessment & Plan:

## 2012-11-02 NOTE — Assessment & Plan Note (Signed)
Bentyl has helped in past with esophageal spasm .. reiflled.

## 2012-11-02 NOTE — Assessment & Plan Note (Signed)
Work on lifestyle changes.  

## 2012-11-04 ENCOUNTER — Ambulatory Visit: Payer: Medicare Other | Admitting: Family Medicine

## 2012-12-29 DIAGNOSIS — H31019 Macula scars of posterior pole (postinflammatory) (post-traumatic), unspecified eye: Secondary | ICD-10-CM | POA: Diagnosis not present

## 2012-12-29 DIAGNOSIS — H353290 Exudative age-related macular degeneration, unspecified eye, stage unspecified: Secondary | ICD-10-CM | POA: Diagnosis not present

## 2012-12-29 DIAGNOSIS — H04129 Dry eye syndrome of unspecified lacrimal gland: Secondary | ICD-10-CM | POA: Diagnosis not present

## 2012-12-29 DIAGNOSIS — H353190 Nonexudative age-related macular degeneration, unspecified eye, stage unspecified: Secondary | ICD-10-CM | POA: Diagnosis not present

## 2013-01-13 DIAGNOSIS — H2510 Age-related nuclear cataract, unspecified eye: Secondary | ICD-10-CM | POA: Diagnosis not present

## 2013-04-06 DIAGNOSIS — Z96659 Presence of unspecified artificial knee joint: Secondary | ICD-10-CM | POA: Diagnosis not present

## 2013-04-06 DIAGNOSIS — M25569 Pain in unspecified knee: Secondary | ICD-10-CM | POA: Diagnosis not present

## 2013-04-13 ENCOUNTER — Other Ambulatory Visit (HOSPITAL_COMMUNITY): Payer: Self-pay | Admitting: Orthopedic Surgery

## 2013-04-13 DIAGNOSIS — M25562 Pain in left knee: Secondary | ICD-10-CM

## 2013-04-13 DIAGNOSIS — Z96659 Presence of unspecified artificial knee joint: Secondary | ICD-10-CM | POA: Diagnosis not present

## 2013-04-19 ENCOUNTER — Encounter (HOSPITAL_COMMUNITY)
Admission: RE | Admit: 2013-04-19 | Discharge: 2013-04-19 | Disposition: A | Discharge status: Home / Self Care | Payer: Medicare Other | Source: Ambulatory Visit | Attending: Orthopedic Surgery | Admitting: Orthopedic Surgery

## 2013-04-19 DIAGNOSIS — M25569 Pain in unspecified knee: Secondary | ICD-10-CM | POA: Insufficient documentation

## 2013-04-19 DIAGNOSIS — Z471 Aftercare following joint replacement surgery: Secondary | ICD-10-CM | POA: Diagnosis not present

## 2013-04-19 DIAGNOSIS — Z96659 Presence of unspecified artificial knee joint: Secondary | ICD-10-CM | POA: Diagnosis not present

## 2013-04-19 DIAGNOSIS — M25562 Pain in left knee: Secondary | ICD-10-CM

## 2013-04-19 MED ORDER — TECHNETIUM TC 99M MEDRONATE IV KIT
25.0000 | PACK | Freq: Once | INTRAVENOUS | Status: AC | PRN
  Administered 2013-04-19: 25 via INTRAVENOUS

## 2013-05-03 ENCOUNTER — Telehealth: Payer: Self-pay | Admitting: Family Medicine

## 2013-05-03 ENCOUNTER — Other Ambulatory Visit (INDEPENDENT_AMBULATORY_CARE_PROVIDER_SITE_OTHER): Payer: Medicare Other

## 2013-05-03 DIAGNOSIS — E785 Hyperlipidemia, unspecified: Secondary | ICD-10-CM

## 2013-05-03 DIAGNOSIS — M949 Disorder of cartilage, unspecified: Secondary | ICD-10-CM

## 2013-05-03 DIAGNOSIS — R7309 Other abnormal glucose: Secondary | ICD-10-CM

## 2013-05-03 DIAGNOSIS — M899 Disorder of bone, unspecified: Secondary | ICD-10-CM

## 2013-05-03 LAB — LIPID PANEL
Cholesterol: 226 mg/dL — ABNORMAL HIGH (ref 0–200)
HDL: 31.8 mg/dL — ABNORMAL LOW (ref >39.00)
Total CHOL/HDL Ratio: 7
Triglycerides: 297 mg/dL — ABNORMAL HIGH (ref 0.0–149.0)
VLDL: 59.4 mg/dL — ABNORMAL HIGH (ref 0.0–40.0)

## 2013-05-03 LAB — COMPREHENSIVE METABOLIC PANEL
ALT: 14 U/L (ref 0–35)
AST: 17 U/L (ref 0–37)
Albumin: 3.5 g/dL (ref 3.5–5.2)
Alkaline Phosphatase: 46 U/L (ref 39–117)
BUN: 25 mg/dL — ABNORMAL HIGH (ref 6–23)
CO2: 28 mEq/L (ref 19–32)
Calcium: 9.5 mg/dL (ref 8.4–10.5)
Chloride: 108 mEq/L (ref 96–112)
Creatinine, Ser: 1 mg/dL (ref 0.4–1.2)
GFR: 53.45 mL/min — ABNORMAL LOW (ref >60.00)
Glucose, Bld: 108 mg/dL — ABNORMAL HIGH (ref 70–99)
Potassium: 4.6 mEq/L (ref 3.5–5.1)
Sodium: 140 mEq/L (ref 135–145)
Total Bilirubin: 0.4 mg/dL (ref 0.3–1.2)
Total Protein: 6.9 g/dL (ref 6.0–8.3)

## 2013-05-03 LAB — LDL CHOLESTEROL, DIRECT: Direct LDL: 122.4 mg/dL

## 2013-05-03 NOTE — Telephone Encounter (Signed)
Message copied by Excell Seltzer on Tue May 03, 2013  8:09 AM ------      Message from: Baldomero Lamy      Created: Fri Apr 29, 2013  5:00 PM      Regarding: Cpx labs Tues 7/15       Please order  future cpx labs for pt's upcoming lab appt.      Thanks      Tasha       ------

## 2013-05-05 DIAGNOSIS — Z96659 Presence of unspecified artificial knee joint: Secondary | ICD-10-CM | POA: Diagnosis not present

## 2013-05-05 DIAGNOSIS — M25569 Pain in unspecified knee: Secondary | ICD-10-CM | POA: Diagnosis not present

## 2013-05-10 ENCOUNTER — Other Ambulatory Visit: Payer: Self-pay | Admitting: Family Medicine

## 2013-05-10 ENCOUNTER — Ambulatory Visit (INDEPENDENT_AMBULATORY_CARE_PROVIDER_SITE_OTHER): Payer: Medicare Other | Admitting: Family Medicine

## 2013-05-10 ENCOUNTER — Encounter: Payer: Self-pay | Admitting: Family Medicine

## 2013-05-10 VITALS — BP 140/84 | HR 64 | Temp 98.2°F | Ht 64.0 in | Wt 157.0 lb

## 2013-05-10 DIAGNOSIS — Z01810 Encounter for preprocedural cardiovascular examination: Secondary | ICD-10-CM | POA: Diagnosis not present

## 2013-05-10 DIAGNOSIS — M949 Disorder of cartilage, unspecified: Secondary | ICD-10-CM | POA: Diagnosis not present

## 2013-05-10 DIAGNOSIS — M899 Disorder of bone, unspecified: Secondary | ICD-10-CM | POA: Diagnosis not present

## 2013-05-10 DIAGNOSIS — I10 Essential (primary) hypertension: Secondary | ICD-10-CM | POA: Diagnosis not present

## 2013-05-10 DIAGNOSIS — Z0000 Encounter for general adult medical examination without abnormal findings: Secondary | ICD-10-CM | POA: Diagnosis not present

## 2013-05-10 DIAGNOSIS — R7309 Other abnormal glucose: Secondary | ICD-10-CM | POA: Diagnosis not present

## 2013-05-10 DIAGNOSIS — Z1231 Encounter for screening mammogram for malignant neoplasm of breast: Secondary | ICD-10-CM

## 2013-05-10 LAB — HEMOGLOBIN A1C: Hgb A1c MFr Bld: 6.3 % (ref 4.6–6.5)

## 2013-05-10 NOTE — Assessment & Plan Note (Signed)
Due for DEXA. 

## 2013-05-10 NOTE — Assessment & Plan Note (Signed)
Borderline control. Follow at home. Continue current medication.  

## 2013-05-10 NOTE — Assessment & Plan Note (Signed)
Well controlled. Continue current medication.  

## 2013-05-10 NOTE — Progress Notes (Signed)
HPI  I have personally reviewed the Medicare Annual Wellness questionnaire and have noted  1. The patient's medical and social history  2. Their use of alcohol, tobacco or illicit drugs  3. Their current medications and supplements  4. The patient's functional ability including ADL's, fall risks, home safety risks and hearing or visual  impairment.  5. Diet and physical activities  6. Evidence for depression or mood disorders  The patients weight, height, BMI and visual acuity have been recorded in the chart  I have made referrals, counseling and provided education to the patient based review of the above and I have provided the pt with a written personalized care plan for preventive services.   Hypertension: On metoprolol, lisinopril/HCTZ  Using medication without problems or lightheadedness: none  Chest pain with exertion:None  Edema:None  Short of breath:None  Average home BPs: well controlled at home.. 134/80s Other issues:    Elevated Cholesterol: Well controlled at goal on no med.  Lab Results  Component Value Date   CHOL 226* 05/03/2013   HDL 31.80* 05/03/2013   LDLCALC 128* 06/12/2009   LDLDIRECT 122.4 05/03/2013   TRIG 297.0* 05/03/2013   CHOLHDL 7 05/03/2013  Diet compliance: good  Exercise: Designer, television/film set...occ walking Other complaints:   Prediabetes: Improved some from last year.  Right knee surgery in 07/2012.  Left knee surgery in 2006. No complications or issues with past surgery. Conitnued issue with left. Needs clearance for surgery.  Review of Systems  Constitutional: Negative for fever, fatigue and unexpected weight change.  HENT: Negative for ear pain, congestion, sore throat, sneezing, trouble swallowing and sinus pressure.  Eyes: Negative for pain and itching.  Respiratory: Negative for cough, shortness of breath and wheezing.  Cardiovascular: Negative for chest pain, palpitations and leg swelling.  Gastrointestinal: Negative for nausea, abdominal pain,  diarrhea, constipation and blood in stool.  GERD, intermittantly usingn omeprazole  Genitourinary: Positive for frequency. Negative for dysuria, hematuria, vaginal discharge and difficulty urinating.  Skin: Negative for rash.  Neurological: Negative for syncope, weakness, light-headedness, numbness and headaches.  No recent falls  Psychiatric/Behavioral: Negative for confusion and dysphoric mood. The patient is not nervous/anxious.  Objective:   Physical Exam  Constitutional: Vital signs are normal. She appears well-developed and well-nourished. She is cooperative. Non-toxic appearance. She does not appear ill. No distress.  Overweight, elderly  HENT:  Head: Normocephalic.  Right Ear: Hearing, tympanic membrane, external ear and ear canal normal.  Left Ear: Hearing, tympanic membrane, external ear and ear canal normal.  Nose: Nose normal.  Eyes: Conjunctivae, EOM and lids are normal. Pupils are equal, round, and reactive to light. No foreign bodies found.  Neck: Trachea normal and normal range of motion. Neck supple. Carotid bruit is not present. No mass and no thyromegaly present.  Cardiovascular: Normal rate, regular rhythm, S1 normal, S2 normal, normal heart sounds and intact distal pulses. Exam reveals no gallop.  No murmur heard.  Pulmonary/Chest: Effort normal and breath sounds normal. No respiratory distress. She has no wheezes. She has no rhonchi. She has no rales.  Abdominal: Soft. Normal appearance and bowel sounds are normal. She exhibits no distension, no fluid wave, no abdominal bruit and no mass. There is no hepatosplenomegaly. There is no tenderness. There is no rebound, no guarding and no CVA tenderness. No hernia.  Lymphadenopathy:  She has no cervical adenopathy.  She has no axillary adenopathy.  Neurological: She is alert. She has normal strength. No cranial nerve deficit or sensory deficit.  Skin: Skin is warm, dry and intact. No rash noted.  Psychiatric: Her speech is  normal and behavior is normal. Judgment normal. Her mood appears not anxious. Cognition and memory are normal. She does not exhibit a depressed mood.  Assessment & Plan:   The patient's preventative maintenance and recommended screening tests for an annual wellness exam were reviewed in full today.  Brought up to date unless services declined.  Counselled on the importance of diet, exercise, and its role in overall health and mortality.  The patient's FH and SH was reviewed, including their home life, tobacco status, and drug and alcohol status.   Vaccines:Uptodate with vaccines Td, PNA and zostavax  Colon: incomplete colonoscopy 2009.Marland Kitchen Repeat prn only.  Pap/DVE not indicated.  Mammo: likes to continue, sister and daughter with breast cancer.  Non smoker  DEXA: last in 2010 osteopenia, due now.

## 2013-05-10 NOTE — Assessment & Plan Note (Signed)
Check A1C prior to surgery.

## 2013-05-10 NOTE — Patient Instructions (Addendum)
Check BP daily for the next few days to a week... Call and let us know measurements.  Goal <140/90.  Work on low [employer] and  [employer].  Avoid bread, sweets etc. Stop at lab on way out for A1C.  Call top schedule mammogram on your own.  Stop at front desk to set up bone density on your way.

## 2013-05-19 ENCOUNTER — Encounter (HOSPITAL_COMMUNITY): Payer: Self-pay | Admitting: Pharmacy Technician

## 2013-05-21 HISTORY — PX: OTHER SURGICAL HISTORY: SHX169

## 2013-05-23 ENCOUNTER — Other Ambulatory Visit (HOSPITAL_COMMUNITY): Payer: Self-pay | Admitting: Orthopedic Surgery

## 2013-05-23 NOTE — Progress Notes (Signed)
EKG 7/14 with LOV Dr Karie Georges EPIC,  Clearance Dr Karie Georges on chart

## 2013-05-23 NOTE — Patient Instructions (Addendum)
20 [name]  05/23/2013   Your procedure is scheduled on:  05/30/13  MONDAY  Report to [facility] at1010 AM.  Call this number if you have problems the morning of surgery: [phone]       Remember:   Do not eat food  Or drink :After Midnight. Sunday NIGHT   Take these medicines the morning of surgery with A SIP OF WATER: METOPROLOL, PROLISEC(omeprazole)   May take Hydrocodone if needed                          Eye drops if needed  .  Contacts, dentures or partial plates can not be worn to surgery  Leave suitcase in the car. After surgery it may be brought to your room.  For patients admitted to the hospital, checkout time is 11:00 AM day of  discharge.             SPECIAL INSTRUCTIONS- SEE [facility] PREPARING FOR SURGERY INSTRUCTION SHEET-     DO NOT WEAR JEWELRY, LOTIONS, POWDERS, OR PERFUMES.  WOMEN-- DO NOT SHAVE LEGS OR UNDERARMS FOR 12 HOURS BEFORE SHOWERS. MEN MAY SHAVE FACE.  Patients discharged the day of surgery will not be allowed to drive home. IF going home the day of surgery, you must have a driver and someone to stay with you for the first 24 hours  Name and phone number of your driver:  ADMISSION                                                                      Please read over the following fact sheets that you were given: MRSA Information, Incentive Spirometry Sheet, Blood Transfusion Sheet  Information                                                                                   [name]  PST [phone]  [phone]                 FAILURE TO FOLLOW THESE INSTRUCTIONS MAY RESULT IN  CANCELLATION   OF YOUR SURGERY                                                  Patient Signature _____________________________

## 2013-05-24 ENCOUNTER — Ambulatory Visit (HOSPITAL_COMMUNITY)
Admission: RE | Admit: 2013-05-24 | Discharge: 2013-05-24 | Disposition: A | Discharge status: Home / Self Care | Payer: Medicare Other | Source: Ambulatory Visit | Attending: Orthopedic Surgery | Admitting: Orthopedic Surgery

## 2013-05-24 ENCOUNTER — Encounter (HOSPITAL_COMMUNITY): Payer: Self-pay

## 2013-05-24 ENCOUNTER — Encounter (HOSPITAL_COMMUNITY)
Admission: RE | Admit: 2013-05-24 | Discharge: 2013-05-24 | Disposition: A | Discharge status: Home / Self Care | Payer: Medicare Other | Source: Ambulatory Visit | Attending: Orthopedic Surgery | Admitting: Orthopedic Surgery

## 2013-05-24 DIAGNOSIS — Z01818 Encounter for other preprocedural examination: Secondary | ICD-10-CM | POA: Insufficient documentation

## 2013-05-24 DIAGNOSIS — I517 Cardiomegaly: Secondary | ICD-10-CM | POA: Insufficient documentation

## 2013-05-24 DIAGNOSIS — Z01812 Encounter for preprocedural laboratory examination: Secondary | ICD-10-CM | POA: Insufficient documentation

## 2013-05-24 LAB — URINALYSIS, ROUTINE W REFLEX MICROSCOPIC
Bilirubin Urine: NEGATIVE
Glucose, UA: NEGATIVE mg/dL
Hgb urine dipstick: NEGATIVE
Ketones, ur: NEGATIVE mg/dL
Leukocytes, UA: NEGATIVE
Nitrite: NEGATIVE
Protein, ur: NEGATIVE mg/dL
Specific Gravity, Urine: 1.016 (ref 1.005–1.030)
Urobilinogen, UA: 0.2 mg/dL (ref 0.0–1.0)
pH: 5 (ref 5.0–8.0)

## 2013-05-24 LAB — CBC
HCT: 38.8 % (ref 36.0–46.0)
Hemoglobin: 13.1 g/dL (ref 12.0–15.0)
MCH: 30.7 pg (ref 26.0–34.0)
MCHC: 33.8 g/dL (ref 30.0–36.0)
MCV: 90.9 fL (ref 78.0–100.0)
Platelets: 213 10*3/uL (ref 150–400)
RBC: 4.27 MIL/uL (ref 3.87–5.11)
RDW: 13.5 % (ref 11.5–15.5)
WBC: 7.3 10*3/uL (ref 4.0–10.5)

## 2013-05-24 LAB — BASIC METABOLIC PANEL
BUN: 30 mg/dL — ABNORMAL HIGH (ref 6–23)
CO2: 27 mEq/L (ref 19–32)
Calcium: 10 mg/dL (ref 8.4–10.5)
Chloride: 99 mEq/L (ref 96–112)
Creatinine, Ser: 1.04 mg/dL (ref 0.50–1.10)
GFR calc Af Amer: 55 mL/min — ABNORMAL LOW (ref >90)
GFR calc non Af Amer: 48 mL/min — ABNORMAL LOW (ref >90)
Glucose, Bld: 101 mg/dL — ABNORMAL HIGH (ref 70–99)
Potassium: 4.9 mEq/L (ref 3.5–5.1)
Sodium: 134 mEq/L — ABNORMAL LOW (ref 135–145)

## 2013-05-24 LAB — APTT: aPTT: 39 seconds — ABNORMAL HIGH (ref 24–37)

## 2013-05-24 LAB — PROTIME-INR
INR: 1.02 (ref 0.00–1.49)
Prothrombin Time: 13.2 seconds (ref 11.6–15.2)

## 2013-05-24 LAB — SURGICAL PCR SCREEN
MRSA, PCR: NEGATIVE
Staphylococcus aureus: NEGATIVE

## 2013-05-26 DIAGNOSIS — M25569 Pain in unspecified knee: Secondary | ICD-10-CM | POA: Diagnosis not present

## 2013-05-26 NOTE — Progress Notes (Signed)
Ptt, bmet results faxed to dr Charlann Boxer by epic

## 2013-05-29 NOTE — H&P (Signed)
TOTAL KNEE REVISION ADMISSION H&P  Patient is being admitted for left revision total knee arthroplasty.  Subjective:  Chief Complaint:  Left knee pain, s/p left total knee arthroplasty.  HPI: [name], 79 y.o. female, has a history of pain and functional disability in the left knee(s) due to persistant pain and patient has failed non-surgical conservative treatments for greater than 12 weeks to include NSAID's and/or analgesics, supervised PT with diminished ADL's post treatment, use of assistive devices and activity modification. The indications for the revision of the total knee arthroplasty are persistant pain. Onset of symptoms was gradual starting 2 years ago with gradually worsening course since that time.  Prior procedures on the left knee(s) include arthroplasty.  Patient currently rates pain in the left knee(s) at 8 out of 10 with activity. There is worsening of pain with activity and weight bearing, pain that interferes with activities of daily living, pain with passive range of motion and when trying to stand after prolonged sitting.  This condition presents safety issues increasing the risk of falls.  There is no current active signs of infection.   At this point, based on discussions between Dr. Charlann Boxer and the patient and patient's family [name] wishes to proceed with a left total knee revision. Dr. Charlann Boxer will plan to revise the entire knee.  At this point, Dr. Charlann Boxer is uncertain of the source of her pain and this has been discussed for the years that we have been following her. In comparison, [name] had a right total knee replacement that [name] has done very well with which makes this even more of a confounding variable. [name] wishes to proceed in this fashion understanding the risks that there could be persistent pain, risk of infection and the postoperative course and expectations with her knee.  D/C Plans:   SNF [facility])  Post-op Meds: No Rx given  Tranexamic Acid:   To be  given  Decadron:    To be given  FYI:   ASA post-op   Patient Active Problem List   Diagnosis Date Noted  . GASTRITIS 02/05/2010  . OSTEOPENIA 06/28/2009  . COMPUTERIZED TOMOGRAPHY, CHEST, ABNORMAL 06/21/2009  . PREDIABETES 11/06/2008  . EROSIVE ESOPHAGITIS 11/12/2007  . PUD 11/12/2007  . GERD 08/20/2007  . OSTEOARTHRITIS 08/20/2007  . HYPERLIPIDEMIA 08/11/2007  . HYPERTENSION, BENIGN 07/30/2007   Past Medical History  Diagnosis Date  . Osteoarthritis   . GERD (gastroesophageal reflux disease)   . HLD (hyperlipidemia)   . HTN (hypertension)   . Gastric ulcer   . Erosive esophagitis   . Osteopenia   . Gastritis   . Prediabetes     pt does not check cbg at home    Past Surgical History  Procedure Laterality Date  . Left knee replacement  09-30-2005  . Right shoulder surgery  2005  . Nuclear cardiolyte  10/08  . Knee surgery Left 05/2009    to cut nerves  . Right knee replacement Right 08-10-2013  . Appendectomy  yrs ago  . Benign nodule removed from neck  05-21-2013  . Eye surgery Bilateral 9- 9-19-2011and 07-2010    cataract lens replacement  . Arthroscopic knee surgery Left yrs ago    No prescriptions prior to admission   No Known Allergies  History  Substance Use Topics  . Smoking status: Never Smoker   . Smokeless tobacco: Never Used  . Alcohol Use: No    Family History  Problem Relation Age of Onset  . Coronary artery  disease Father 47  . Hypertension Sister     x 4     Review of Systems  Constitutional: Negative.   HENT: Positive for hearing loss.   Eyes: Negative.   Respiratory: Negative.   Cardiovascular: Negative.   Gastrointestinal: Positive for heartburn.  Genitourinary: Positive for frequency.  Musculoskeletal: Positive for joint pain.  Skin: Negative.   Neurological: Negative.   Endo/Heme/Allergies: Negative.   Psychiatric/Behavioral: Negative.      Objective:  Physical Exam  Constitutional: [name] is oriented to person, place, and  time. [name] appears well-developed and well-nourished.  HENT:  Head: Normocephalic and atraumatic.  Mouth/Throat: Oropharynx is clear and moist.  Eyes: Pupils are equal, round, and reactive to light.  Neck: Neck supple. No JVD present. No tracheal deviation present. No thyromegaly present.  Cardiovascular: Normal rate, regular rhythm, normal heart sounds and intact distal pulses.   Respiratory: Effort normal and breath sounds normal. No respiratory distress. [name] has no wheezes.  GI: Soft. There is no tenderness. There is no guarding.  Musculoskeletal:       Left knee: [name] exhibits laceration and bony tenderness. [name] exhibits normal range of motion, no swelling, no effusion, no ecchymosis, no deformity, no erythema and normal alignment. Tenderness found.  Lymphadenopathy:    [name] has no cervical adenopathy.  Neurological: [name] is alert and oriented to person, place, and time.  Skin: Skin is warm and dry.  Psychiatric: [name] has a normal mood and affect.    Labs:  Estimated body mass index is 27.28 kg/(m^2) as calculated from the following:   Height as of 11/02/12: 5\' 4"  (1.626 m).   Weight as of 11/02/12: 72.122 kg (159 lb).  Imaging Review Plain radiographs demonstrate previous total knee arthroplasty of the left knee(s). The overall alignment is neutral. No evidence of loosening on bone scan. The bone quality appears to be good for age and reported activity level.  Assessment/Plan:  Painful previous left total knee arthroplasty.   The patient history, physical examination, clinical judgment of the provider and imaging studies are consistent with end stage degenerative joint disease of the left knee(s), previous total knee arthroplasty. Revision total knee arthroplasty is deemed medically necessary. The treatment options including medical management, injection therapy, arthroscopy and revision arthroplasty were discussed at length. The risks and benefits of revision total knee arthroplasty  were presented and reviewed. The risks due to aseptic loosening, infection, stiffness, patella tracking problems, thromboembolic complications and other imponderables were discussed. The patient acknowledged the explanation, agreed to proceed with the plan and consent was signed. Patient is being admitted for inpatient treatment for surgery, pain control, PT, OT, prophylactic antibiotics, VTE prophylaxis, progressive ambulation and ADL's and discharge planning.The patient is planning to be discharged to skilled nursing facility.    Anastasio Auerbach Nargis Abrams   PAC  05/29/2013, 2:41 PM

## 2013-05-30 ENCOUNTER — Inpatient Hospital Stay (HOSPITAL_COMMUNITY): Payer: Medicare Other | Admitting: Certified Registered"

## 2013-05-30 ENCOUNTER — Encounter (HOSPITAL_COMMUNITY): Payer: Self-pay | Admitting: Certified Registered"

## 2013-05-30 ENCOUNTER — Encounter (HOSPITAL_COMMUNITY)
Admission: RE | Disposition: A | Discharge status: Skilled Nursing Facility | Payer: Self-pay | Source: Ambulatory Visit | Attending: Orthopedic Surgery

## 2013-05-30 ENCOUNTER — Inpatient Hospital Stay (HOSPITAL_COMMUNITY)
Admission: RE | Admit: 2013-05-30 | Discharge: 2013-06-02 | DRG: 467 | Disposition: A | Discharge status: Skilled Nursing Facility | Payer: Medicare Other | Source: Ambulatory Visit | Attending: Orthopedic Surgery | Admitting: Orthopedic Surgery

## 2013-05-30 ENCOUNTER — Encounter (HOSPITAL_COMMUNITY): Payer: Self-pay | Admitting: *Deleted

## 2013-05-30 DIAGNOSIS — E785 Hyperlipidemia, unspecified: Secondary | ICD-10-CM | POA: Diagnosis not present

## 2013-05-30 DIAGNOSIS — H04129 Dry eye syndrome of unspecified lacrimal gland: Secondary | ICD-10-CM | POA: Diagnosis not present

## 2013-05-30 DIAGNOSIS — K219 Gastro-esophageal reflux disease without esophagitis: Secondary | ICD-10-CM | POA: Diagnosis not present

## 2013-05-30 DIAGNOSIS — E871 Hypo-osmolality and hyponatremia: Secondary | ICD-10-CM | POA: Diagnosis not present

## 2013-05-30 DIAGNOSIS — Z96659 Presence of unspecified artificial knee joint: Secondary | ICD-10-CM | POA: Diagnosis not present

## 2013-05-30 DIAGNOSIS — D649 Anemia, unspecified: Secondary | ICD-10-CM | POA: Diagnosis not present

## 2013-05-30 DIAGNOSIS — Y831 Surgical operation with implant of artificial internal device as the cause of abnormal reaction of the patient, or of later complication, without mention of misadventure at the time of the procedure: Secondary | ICD-10-CM | POA: Diagnosis present

## 2013-05-30 DIAGNOSIS — M949 Disorder of cartilage, unspecified: Secondary | ICD-10-CM | POA: Diagnosis present

## 2013-05-30 DIAGNOSIS — I2510 Atherosclerotic heart disease of native coronary artery without angina pectoris: Secondary | ICD-10-CM | POA: Diagnosis not present

## 2013-05-30 DIAGNOSIS — D62 Acute posthemorrhagic anemia: Secondary | ICD-10-CM | POA: Diagnosis not present

## 2013-05-30 DIAGNOSIS — I10 Essential (primary) hypertension: Secondary | ICD-10-CM | POA: Diagnosis not present

## 2013-05-30 DIAGNOSIS — Z96652 Presence of left artificial knee joint: Secondary | ICD-10-CM

## 2013-05-30 DIAGNOSIS — T84039A Mechanical loosening of unspecified internal prosthetic joint, initial encounter: Secondary | ICD-10-CM | POA: Diagnosis not present

## 2013-05-30 DIAGNOSIS — M6281 Muscle weakness (generalized): Secondary | ICD-10-CM | POA: Diagnosis not present

## 2013-05-30 DIAGNOSIS — S8990XA Unspecified injury of unspecified lower leg, initial encounter: Secondary | ICD-10-CM | POA: Diagnosis not present

## 2013-05-30 DIAGNOSIS — M899 Disorder of bone, unspecified: Secondary | ICD-10-CM | POA: Diagnosis present

## 2013-05-30 DIAGNOSIS — T84099A Other mechanical complication of unspecified internal joint prosthesis, initial encounter: Principal | ICD-10-CM | POA: Diagnosis present

## 2013-05-30 DIAGNOSIS — Z471 Aftercare following joint replacement surgery: Secondary | ICD-10-CM | POA: Diagnosis not present

## 2013-05-30 DIAGNOSIS — M1990 Unspecified osteoarthritis, unspecified site: Secondary | ICD-10-CM | POA: Diagnosis not present

## 2013-05-30 DIAGNOSIS — M25569 Pain in unspecified knee: Secondary | ICD-10-CM | POA: Diagnosis not present

## 2013-05-30 DIAGNOSIS — E663 Overweight: Secondary | ICD-10-CM | POA: Diagnosis present

## 2013-05-30 DIAGNOSIS — R279 Unspecified lack of coordination: Secondary | ICD-10-CM | POA: Diagnosis not present

## 2013-05-30 DIAGNOSIS — R269 Unspecified abnormalities of gait and mobility: Secondary | ICD-10-CM | POA: Diagnosis not present

## 2013-05-30 DIAGNOSIS — K5900 Constipation, unspecified: Secondary | ICD-10-CM | POA: Diagnosis not present

## 2013-05-30 DIAGNOSIS — D500 Iron deficiency anemia secondary to blood loss (chronic): Secondary | ICD-10-CM

## 2013-05-30 DIAGNOSIS — Z6825 Body mass index (BMI) 25.0-25.9, adult: Secondary | ICD-10-CM

## 2013-05-30 HISTORY — PX: TOTAL KNEE REVISION: SHX996

## 2013-05-30 LAB — TYPE AND SCREEN
ABO/RH(D): A POS
Antibody Screen: NEGATIVE

## 2013-05-30 SURGERY — TOTAL KNEE REVISION
Anesthesia: Spinal | Site: Knee | Laterality: Left | Wound class: Clean

## 2013-05-30 MED ORDER — DOCUSATE SODIUM 100 MG PO CAPS
100.0000 mg | ORAL_CAPSULE | Freq: Two times a day (BID) | ORAL | Status: DC
  Administered 2013-05-30 – 2013-06-02 (×6): 100 mg via ORAL

## 2013-05-30 MED ORDER — PHENYLEPHRINE HCL 10 MG/ML IJ SOLN
10.0000 mg | INTRAVENOUS | Status: DC | PRN
  Administered 2013-05-30: 20 ug/min via INTRAVENOUS

## 2013-05-30 MED ORDER — HYPROMELLOSE (GONIOSCOPIC) 2.5 % OP SOLN: 1.0000 [drp] | Freq: Three times a day (TID) | OPHTHALMIC | Status: DC | PRN

## 2013-05-30 MED ORDER — PANTOPRAZOLE SODIUM 40 MG PO TBEC
80.0000 mg | DELAYED_RELEASE_TABLET | Freq: Every day | ORAL | Status: DC
  Administered 2013-05-31 – 2013-06-02 (×3): 80 mg via ORAL
  Filled 2013-05-30 (×5): qty 2

## 2013-05-30 MED ORDER — FENTANYL CITRATE 0.05 MG/ML IJ SOLN
INTRAMUSCULAR | Status: DC | PRN
  Administered 2013-05-30: 25 ug via INTRAVENOUS
  Administered 2013-05-30: 50 ug via INTRAVENOUS
  Administered 2013-05-30: 25 ug via INTRAVENOUS

## 2013-05-30 MED ORDER — SODIUM CHLORIDE 0.9 % IJ SOLN
INTRAMUSCULAR | Status: DC | PRN
  Administered 2013-05-30: 14 mL

## 2013-05-30 MED ORDER — BUPIVACAINE IN DEXTROSE 0.75-8.25 % IT SOLN
INTRATHECAL | Status: DC | PRN
  Administered 2013-05-30: 2 mL via INTRATHECAL

## 2013-05-30 MED ORDER — DIPHENHYDRAMINE HCL 25 MG PO CAPS: 25.0000 mg | ORAL_CAPSULE | Freq: Four times a day (QID) | ORAL | Status: DC | PRN

## 2013-05-30 MED ORDER — LACTATED RINGERS IV SOLN
INTRAVENOUS | Status: AC
  Administered 2013-05-30: 1000 mL via INTRAVENOUS
  Administered 2013-05-30: 15:00:00 via INTRAVENOUS

## 2013-05-30 MED ORDER — HYDROMORPHONE HCL PF 1 MG/ML IJ SOLN
0.5000 mg | INTRAMUSCULAR | Status: DC | PRN
  Administered 2013-05-30 (×3): 1 mg via INTRAVENOUS
  Filled 2013-05-30 (×3): qty 1

## 2013-05-30 MED ORDER — KETOROLAC TROMETHAMINE 30 MG/ML IJ SOLN
INTRAMUSCULAR | Status: DC | PRN
  Administered 2013-05-30: 30 mg via INTRAMUSCULAR

## 2013-05-30 MED ORDER — ASPIRIN EC 325 MG PO TBEC
325.0000 mg | DELAYED_RELEASE_TABLET | Freq: Two times a day (BID) | ORAL | Status: DC
  Administered 2013-05-31 – 2013-06-02 (×5): 325 mg via ORAL
  Filled 2013-05-30 (×7): qty 1

## 2013-05-30 MED ORDER — POLYVINYL ALCOHOL 1.4 % OP SOLN
1.0000 [drp] | Freq: Three times a day (TID) | OPHTHALMIC | Status: DC | PRN
  Filled 2013-05-30: qty 15

## 2013-05-30 MED ORDER — FLEET ENEMA 7-19 GM/118ML RE ENEM: 1.0000 | ENEMA | Freq: Once | RECTAL | Status: AC | PRN

## 2013-05-30 MED ORDER — METHOCARBAMOL 100 MG/ML IJ SOLN
500.0000 mg | Freq: Four times a day (QID) | INTRAVENOUS | Status: DC | PRN
  Filled 2013-05-30: qty 5

## 2013-05-30 MED ORDER — ALUM & MAG HYDROXIDE-SIMETH 200-200-20 MG/5ML PO SUSP: 30.0000 mL | ORAL | Status: DC | PRN

## 2013-05-30 MED ORDER — CEFAZOLIN SODIUM-DEXTROSE 2-3 GM-% IV SOLR
2.0000 g | Freq: Four times a day (QID) | INTRAVENOUS | Status: AC
  Administered 2013-05-30 – 2013-05-31 (×2): 2 g via INTRAVENOUS
  Filled 2013-05-30 (×2): qty 50

## 2013-05-30 MED ORDER — BISACODYL 10 MG RE SUPP: 10.0000 mg | Freq: Every day | RECTAL | Status: DC | PRN

## 2013-05-30 MED ORDER — TRANEXAMIC ACID 100 MG/ML IV SOLN
1000.0000 mg | Freq: Once | INTRAVENOUS | Status: AC
  Administered 2013-05-30: 1000 mg via INTRAVENOUS
  Filled 2013-05-30: qty 10

## 2013-05-30 MED ORDER — SODIUM CHLORIDE 0.9 % IR SOLN
Status: DC | PRN
  Administered 2013-05-30 (×2): 1000 mL

## 2013-05-30 MED ORDER — DEXAMETHASONE SODIUM PHOSPHATE 10 MG/ML IJ SOLN
10.0000 mg | Freq: Once | INTRAMUSCULAR | Status: AC
  Administered 2013-05-31: 10 mg via INTRAVENOUS
  Filled 2013-05-30: qty 1

## 2013-05-30 MED ORDER — EPHEDRINE SULFATE 50 MG/ML IJ SOLN
INTRAMUSCULAR | Status: DC | PRN
  Administered 2013-05-30: 10 mg via INTRAVENOUS

## 2013-05-30 MED ORDER — FENTANYL CITRATE 0.05 MG/ML IJ SOLN: 25.0000 ug | INTRAMUSCULAR | Status: DC | PRN

## 2013-05-30 MED ORDER — METOPROLOL TARTRATE 50 MG PO TABS
75.0000 mg | ORAL_TABLET | Freq: Two times a day (BID) | ORAL | Status: DC
  Administered 2013-05-30 – 2013-06-02 (×6): 75 mg via ORAL
  Filled 2013-05-30 (×7): qty 1.5

## 2013-05-30 MED ORDER — ONDANSETRON HCL 4 MG PO TABS: 4.0000 mg | ORAL_TABLET | Freq: Four times a day (QID) | ORAL | Status: DC | PRN

## 2013-05-30 MED ORDER — PHENOL 1.4 % MT LIQD: 1.0000 | OROMUCOSAL | Status: DC | PRN

## 2013-05-30 MED ORDER — BUPIVACAINE-EPINEPHRINE 0.25% -1:200000 IJ SOLN
INTRAMUSCULAR | Status: DC | PRN
  Administered 2013-05-30: 25 mL

## 2013-05-30 MED ORDER — MENTHOL 3 MG MT LOZG: 1.0000 | LOZENGE | OROMUCOSAL | Status: DC | PRN

## 2013-05-30 MED ORDER — CEFAZOLIN SODIUM-DEXTROSE 2-3 GM-% IV SOLR
2.0000 g | INTRAVENOUS | Status: AC
  Administered 2013-05-30: 2 g via INTRAVENOUS

## 2013-05-30 MED ORDER — METOCLOPRAMIDE HCL 10 MG PO TABS: 5.0000 mg | ORAL_TABLET | Freq: Three times a day (TID) | ORAL | Status: DC | PRN

## 2013-05-30 MED ORDER — FERROUS SULFATE 325 (65 FE) MG PO TABS
325.0000 mg | ORAL_TABLET | Freq: Three times a day (TID) | ORAL | Status: DC
  Administered 2013-06-01 – 2013-06-02 (×4): 325 mg via ORAL
  Filled 2013-05-30 (×11): qty 1

## 2013-05-30 MED ORDER — BUPIVACAINE LIPOSOME 1.3 % IJ SUSP
INTRAMUSCULAR | Status: DC | PRN
  Administered 2013-05-30: 20 mL

## 2013-05-30 MED ORDER — POLYETHYLENE GLYCOL 3350 17 G PO PACK
17.0000 g | PACK | Freq: Two times a day (BID) | ORAL | Status: DC
  Administered 2013-05-30 – 2013-06-02 (×6): 17 g via ORAL

## 2013-05-30 MED ORDER — BUPIVACAINE LIPOSOME 1.3 % IJ SUSP
20.0000 mL | Freq: Once | INTRAMUSCULAR | Status: DC
  Filled 2013-05-30: qty 20

## 2013-05-30 MED ORDER — METHOCARBAMOL 500 MG PO TABS
500.0000 mg | ORAL_TABLET | Freq: Four times a day (QID) | ORAL | Status: DC | PRN
  Administered 2013-05-31: 500 mg via ORAL
  Filled 2013-05-30: qty 1

## 2013-05-30 MED ORDER — METOCLOPRAMIDE HCL 5 MG/ML IJ SOLN: 5.0000 mg | Freq: Three times a day (TID) | INTRAMUSCULAR | Status: DC | PRN

## 2013-05-30 MED ORDER — ONDANSETRON HCL 4 MG/2ML IJ SOLN
4.0000 mg | Freq: Four times a day (QID) | INTRAMUSCULAR | Status: DC | PRN
  Administered 2013-05-30: 4 mg via INTRAVENOUS
  Filled 2013-05-30: qty 2

## 2013-05-30 MED ORDER — CELECOXIB 200 MG PO CAPS
200.0000 mg | ORAL_CAPSULE | Freq: Two times a day (BID) | ORAL | Status: DC
  Administered 2013-05-30 – 2013-06-02 (×6): 200 mg via ORAL
  Filled 2013-05-30 (×7): qty 1

## 2013-05-30 MED ORDER — ZOLPIDEM TARTRATE 5 MG PO TABS: 5.0000 mg | ORAL_TABLET | Freq: Every evening | ORAL | Status: DC | PRN

## 2013-05-30 MED ORDER — DICYCLOMINE HCL 10 MG PO CAPS
10.0000 mg | ORAL_CAPSULE | Freq: Three times a day (TID) | ORAL | Status: DC | PRN
  Filled 2013-05-30: qty 1

## 2013-05-30 MED ORDER — PROPOFOL INFUSION 10 MG/ML OPTIME
INTRAVENOUS | Status: DC | PRN
  Administered 2013-05-30: 100 ug/kg/min via INTRAVENOUS

## 2013-05-30 MED ORDER — ONDANSETRON HCL 4 MG/2ML IJ SOLN
INTRAMUSCULAR | Status: DC | PRN
  Administered 2013-05-30: 4 mg via INTRAVENOUS

## 2013-05-30 MED ORDER — DEXAMETHASONE SODIUM PHOSPHATE 10 MG/ML IJ SOLN
10.0000 mg | Freq: Once | INTRAMUSCULAR | Status: AC
  Administered 2013-05-30: 10 mg via INTRAVENOUS

## 2013-05-30 MED ORDER — HYDROCODONE-ACETAMINOPHEN 7.5-325 MG PO TABS
1.0000 | ORAL_TABLET | ORAL | Status: DC
  Administered 2013-05-30: 1 via ORAL
  Administered 2013-05-31: 2 via ORAL
  Administered 2013-05-31 (×3): 1 via ORAL
  Administered 2013-05-31 (×3): 2 via ORAL
  Administered 2013-06-01 – 2013-06-02 (×6): 1 via ORAL
  Filled 2013-05-30: qty 2
  Filled 2013-05-30: qty 1
  Filled 2013-05-30: qty 2
  Filled 2013-05-30: qty 1
  Filled 2013-05-30: qty 2
  Filled 2013-05-30 (×3): qty 1
  Filled 2013-05-30: qty 2
  Filled 2013-05-30 (×3): qty 1
  Filled 2013-05-30: qty 2
  Filled 2013-05-30 (×2): qty 1

## 2013-05-30 MED ORDER — SODIUM CHLORIDE 0.9 % IV SOLN
INTRAVENOUS | Status: DC
  Administered 2013-05-30 – 2013-05-31 (×2): via INTRAVENOUS
  Filled 2013-05-30 (×9): qty 1000

## 2013-05-30 MED ORDER — METOPROLOL TARTRATE 25 MG PO TABS
25.0000 mg | ORAL_TABLET | Freq: Two times a day (BID) | ORAL | Status: DC
Start: 2013-05-30 — End: 2013-05-30

## 2013-05-30 SURGICAL SUPPLY — 81 items
ADH SKN CLS APL DERMABOND .7 (GAUZE/BANDAGES/DRESSINGS) ×1
AUG TIB SZ2.5 5 REV STP WDG (Knees) ×2 IMPLANT
AUGMENT PFC SIG RP SZ2.5 12.5M (Knees) IMPLANT
BAG SPEC THK2 15X12 ZIP CLS (MISCELLANEOUS) ×1
BAG ZIPLOCK 12X15 (MISCELLANEOUS) ×2 IMPLANT
BANDAGE ELASTIC 6 VELCRO ST LF (GAUZE/BANDAGES/DRESSINGS) ×2 IMPLANT
BANDAGE ESMARK 6X9 LF (GAUZE/BANDAGES/DRESSINGS) ×1 IMPLANT
BLADE SAW SGTL 13.0X1.19X90.0M (BLADE) ×2 IMPLANT
BLADE SAW SGTL 81X20 HD (BLADE) ×2 IMPLANT
BNDG CMPR 9X6 STRL LF SNTH (GAUZE/BANDAGES/DRESSINGS) ×1
BNDG ESMARK 6X9 LF (GAUZE/BANDAGES/DRESSINGS) ×2
BOWL SMART MIX CTS (DISPOSABLE) IMPLANT
CEMENT HV SMART SET (Cement) ×6 IMPLANT
CEMENT RESTRICTOR DEPUY SZ 4 (Cement) ×2 IMPLANT
CEMENT RESTRICTOR DEPUY SZ 6 (Cement) ×1 IMPLANT
CLOTH BEACON ORANGE TIMEOUT ST (SAFETY) ×2 IMPLANT
CUFF TOURN SGL QUICK 34 (TOURNIQUET CUFF) ×2
CUFF TRNQT CYL 34X4X40X1 (TOURNIQUET CUFF) ×1 IMPLANT
DECANTER SPIKE VIAL GLASS SM (MISCELLANEOUS) ×4 IMPLANT
DERMABOND ADVANCED (GAUZE/BANDAGES/DRESSINGS) ×1
DERMABOND ADVANCED .7 DNX12 (GAUZE/BANDAGES/DRESSINGS) ×1 IMPLANT
DRAPE EXTREMITY T 121X128X90 (DRAPE) ×2 IMPLANT
DRAPE POUCH INSTRU U-SHP 10X18 (DRAPES) ×2 IMPLANT
DRAPE U-SHAPE 47X51 STRL (DRAPES) ×2 IMPLANT
DRSG ADAPTIC 3X8 NADH LF (GAUZE/BANDAGES/DRESSINGS) ×1 IMPLANT
DRSG AQUACEL AG ADV 3.5X10 (GAUZE/BANDAGES/DRESSINGS) ×2 IMPLANT
DRSG PAD ABDOMINAL 8X10 ST (GAUZE/BANDAGES/DRESSINGS) ×1 IMPLANT
DRSG TEGADERM 4X4.75 (GAUZE/BANDAGES/DRESSINGS) ×2 IMPLANT
DURAPREP 26ML APPLICATOR (WOUND CARE) ×2 IMPLANT
ELECT REM PT RETURN 9FT ADLT (ELECTROSURGICAL) ×2
ELECTRODE REM PT RTRN 9FT ADLT (ELECTROSURGICAL) ×1 IMPLANT
EVACUATOR 1/8 PVC DRAIN (DRAIN) ×2 IMPLANT
FACESHIELD LNG OPTICON STERILE (SAFETY) ×10 IMPLANT
FEMORAL PFC TC3 (Orthopedic Implant) ×2 IMPLANT
GAUZE SPONGE 2X2 8PLY STRL LF (GAUZE/BANDAGES/DRESSINGS) ×1 IMPLANT
GLOVE BIOGEL PI IND STRL 7.5 (GLOVE) ×1 IMPLANT
GLOVE BIOGEL PI IND STRL 8 (GLOVE) ×2 IMPLANT
GLOVE BIOGEL PI INDICATOR 7.5 (GLOVE) ×2
GLOVE BIOGEL PI INDICATOR 8 (GLOVE) ×2
GLOVE ECLIPSE 6.5 STRL STRAW (GLOVE) ×2 IMPLANT
GLOVE ECLIPSE 8.0 STRL XLNG CF (GLOVE) ×4 IMPLANT
GLOVE INDICATOR 6.5 STRL GRN (GLOVE) ×2 IMPLANT
GLOVE ORTHO TXT STRL SZ7.5 (GLOVE) ×4 IMPLANT
GLOVE SURG SS PI 6.5 STRL IVOR (GLOVE) ×1 IMPLANT
GLOVE SURG SS PI 7.5 STRL IVOR (GLOVE) ×1 IMPLANT
GOWN BRE IMP PREV XXLGXLNG (GOWN DISPOSABLE) ×6 IMPLANT
GOWN STRL NON-REIN LRG LVL3 (GOWN DISPOSABLE) ×2 IMPLANT
HANDPIECE INTERPULSE COAX TIP (DISPOSABLE) ×2
IMMOBILIZER KNEE 20 (SOFTGOODS)
IMMOBILIZER KNEE 20 THIGH 36 (SOFTGOODS) IMPLANT
KIT BASIN OR (CUSTOM PROCEDURE TRAY) ×2 IMPLANT
MANIFOLD NEPTUNE II (INSTRUMENTS) ×2 IMPLANT
NDL SAFETY ECLIPSE 18X1.5 (NEEDLE) ×1 IMPLANT
NEEDLE HYPO 18GX1.5 SHARP (NEEDLE) ×2
NEEDLE HYPO 22GX1.5 SAFETY (NEEDLE) ×1 IMPLANT
NS IRRIG 1000ML POUR BTL (IV SOLUTION) ×2 IMPLANT
PACK TOTAL JOINT (CUSTOM PROCEDURE TRAY) ×2 IMPLANT
PADDING CAST COTTON 6X4 STRL (CAST SUPPLIES) ×2 IMPLANT
PATELLA DOME PFC 38MM (Knees) ×2 IMPLANT
PFC SIGMA RP STB SZ 2.5 12.5M (Knees) ×2 IMPLANT
POSITIONER SURGICAL ARM (MISCELLANEOUS) ×2 IMPLANT
SET HNDPC FAN SPRY TIP SCT (DISPOSABLE) ×1 IMPLANT
SET PAD KNEE POSITIONER (MISCELLANEOUS) ×2 IMPLANT
SPONGE GAUZE 2X2 STER 10/PKG (GAUZE/BANDAGES/DRESSINGS) ×1
SPONGE GAUZE 4X4 12PLY (GAUZE/BANDAGES/DRESSINGS) ×4 IMPLANT
SPONGE LAP 18X18 X RAY DECT (DISPOSABLE) ×2 IMPLANT
STAPLER VISISTAT 35W (STAPLE) IMPLANT
SUCTION FRAZIER 12FR DISP (SUCTIONS) ×2 IMPLANT
SUT VIC AB 1 CT1 36 (SUTURE) ×6 IMPLANT
SUT VIC AB 2-0 CT1 27 (SUTURE) ×6
SUT VIC AB 2-0 CT1 TAPERPNT 27 (SUTURE) ×3 IMPLANT
SYR 3ML LL SCALE MARK (SYRINGE) ×1 IMPLANT
SYR 50ML LL SCALE MARK (SYRINGE) ×2 IMPLANT
TOWEL OR 17X26 10 PK STRL BLUE (TOWEL DISPOSABLE) ×5 IMPLANT
TOWER CARTRIDGE SMART MIX (DISPOSABLE) ×2 IMPLANT
TRAY FOLEY CATH 14FRSI W/METER (CATHETERS) ×2 IMPLANT
TRAY REVISION SZ 2.5 (Knees) ×1 IMPLANT
TRAY SLEEVE CEM ML (Knees) ×1 IMPLANT
WATER STERILE IRR 1500ML POUR (IV SOLUTION) ×2 IMPLANT
WEDGE STEP SZ.5 5MM (Knees) ×2 IMPLANT
WRAP KNEE MAXI GEL POST OP (GAUZE/BANDAGES/DRESSINGS) ×2 IMPLANT

## 2013-05-30 NOTE — Transfer of Care (Signed)
Immediate Anesthesia Transfer of Care Note  Patient: [name]  Procedure(s) Performed: Procedure(s) (LRB): LEFT TOTAL KNEE REVISION (Left)  Patient Location: PACU  Anesthesia Type: Spinal  Level of Consciousness: sedated, patient cooperative and responds to stimulaton  Airway & Oxygen Therapy: Patient Spontanous Breathing and Patient connected to face mask oxgen  Post-op Assessment: Report given to PACU RN and Post -op Vital signs reviewed and stable  Post vital signs: Reviewed and stable  Complications: No apparent anesthesia complications

## 2013-05-30 NOTE — Anesthesia Procedure Notes (Signed)
Spinal  Patient location during procedure: OR Start time: 05/30/2013 1:00 PM End time: 05/30/2013 1:05 PM Staffing CRNA/Resident: Early Osmond E Performed by: resident/CRNA  Preanesthetic Checklist Completed: patient identified, site marked, surgical consent, pre-op evaluation, timeout performed, IV checked, risks and benefits discussed and monitors and equipment checked Spinal Block Patient position: sitting Prep: Betadine Approach: midline Location: L3-4 Injection technique: single-shot Needle Needle type: Spinocan  Needle gauge: 22 G Needle length: 9 cm Assessment Sensory level: T6 Additional Notes Time out performed, sitting, sterile prep and drape. Clear CSF pre/post injection. Pt tol well. Return to supine. VSS

## 2013-05-30 NOTE — Brief Op Note (Signed)
05/30/2013  2:57 PM  PATIENT:  [name]  79 y.o. female  PRE-OPERATIVE DIAGNOSIS:  FAILED LEFT TOTAL KNEE ARHTROPLASTY  POST-OPERATIVE DIAGNOSIS:  FAILED LEFT TOTAL KNEE ARHTROPLASTY  PROCEDURE:  Procedure(s): LEFT TOTAL KNEE REVISION (Left)  SURGEON:  Surgeon(s) and Role:    * Shelda Pal, MD - Primary  PHYSICIAN ASSISTANT: Lanney Gins, PA-C  ANESTHESIA:   spinal  EBL:  Total I/O In: 1000 [I.V.:1000] Out: 550 [Urine:550]  BLOOD ADMINISTERED:none  DRAINS: (1 medium) Hemovact drain(s) in the left knee with  Suction Open   LOCAL MEDICATIONS USED:  OTHER Exparel combination  SPECIMEN:  No Specimen  DISPOSITION OF SPECIMEN:  N/A  COUNTS:  YES  TOURNIQUET:   Total Tourniquet Time Documented: Thigh (Left) - 54 minutes Total: Thigh (Left) - 54 minutes   DICTATION: .Other Dictation: Dictation Number [id number]  PLAN OF CARE: Admit to inpatient   PATIENT DISPOSITION:  PACU - hemodynamically stable.   Delay start of Pharmacological VTE agent (>24hrs) due to surgical blood loss or risk of bleeding: no

## 2013-05-30 NOTE — Interval H&P Note (Signed)
History and Physical Interval Note:  05/30/2013 11:58 AM  [name]  has presented today for surgery, with the diagnosis of FAILED LEFT TOTAL KNEE ARHTROPLASTY  The various methods of treatment have been discussed with the patient and family. After consideration of risks, benefits and other options for treatment, the patient has consented to  Procedure(s): LEFT TOTAL KNEE REVISION (Left) as a surgical intervention .  The patient's history has been reviewed, patient examined, no change in status, stable for surgery.  I have reviewed the patient's chart and labs.  Questions were answered to the patient's satisfaction.     Shelda Pal

## 2013-05-30 NOTE — Anesthesia Preprocedure Evaluation (Addendum)
Anesthesia Evaluation  Patient identified by MRN, date of birth, ID band Patient awake  General Assessment Comment:  GASTRITIS  02/05/2010   .  OSTEOPENIA  06/28/2009   .  COMPUTERIZED TOMOGRAPHY, CHEST, ABNORMAL  06/21/2009   .  PREDIABETES  11/06/2008   .  EROSIVE ESOPHAGITIS  11/12/2007   .  PUD  11/12/2007   .  GERD  08/20/2007   .  OSTEOARTHRITIS  08/20/2007   .  HYPERLIPIDEMIA  08/11/2007   .  HYPERTENSION, BENIGN     Reviewed: Allergy & Precautions, H&P , NPO status , Patient's Chart, lab work & pertinent test results  Airway Mallampati: II TM Distance: >3 FB Neck ROM: Full    Dental no notable dental hx.    Pulmonary neg pulmonary ROS,  breath sounds clear to auscultation  Pulmonary exam normal       Cardiovascular Exercise Tolerance: Good hypertension, Pt. on medications and Pt. on home beta blockers negative cardio ROS  Rhythm:Regular Rate:Normal     Neuro/Psych negative neurological ROS  negative psych ROS   GI/Hepatic Neg liver ROS, PUD, GERD-  Medicated,  Endo/Other  negative endocrine ROS  Renal/GU negative Renal ROS  negative genitourinary   Musculoskeletal negative musculoskeletal ROS (+)   Abdominal   Peds negative pediatric ROS (+)  Hematology negative hematology ROS (+)   Anesthesia Other Findings   Reproductive/Obstetrics negative OB ROS                           Anesthesia Physical Anesthesia Plan  ASA: III  Anesthesia Plan: Spinal   Post-op Pain Management:    Induction: Intravenous  Airway Management Planned:   Additional Equipment:   Intra-op Plan:   Post-operative Plan:   Informed Consent: I have reviewed the patients History and Physical, chart, labs and discussed the procedure including the risks, benefits and alternatives for the proposed anesthesia with the patient or authorized representative who has indicated his/her understanding and  acceptance.   Dental advisory given  Plan Discussed with: CRNA  Anesthesia Plan Comments: (Discussed general versus spinal. Discussed risks/benefits of spinal including headache, backache, failure, bleeding, infection, and nerve damage. Patient consents to spinal. Questions answered. Coagulation studies and platelet count acceptable. Ms. Glade had spinals for her last two TKA procedures and did well.)     Anesthesia Quick Evaluation

## 2013-05-30 NOTE — Preoperative (Signed)
Beta Blockers   Reason not to administer Beta Blockers:Not Applicable, Took lopressor this am

## 2013-05-31 ENCOUNTER — Encounter (HOSPITAL_COMMUNITY): Payer: Self-pay | Admitting: Orthopedic Surgery

## 2013-05-31 DIAGNOSIS — E871 Hypo-osmolality and hyponatremia: Secondary | ICD-10-CM

## 2013-05-31 DIAGNOSIS — E663 Overweight: Secondary | ICD-10-CM

## 2013-05-31 DIAGNOSIS — D500 Iron deficiency anemia secondary to blood loss (chronic): Secondary | ICD-10-CM

## 2013-05-31 LAB — CBC
HCT: 31.9 % — ABNORMAL LOW (ref 36.0–46.0)
Hemoglobin: 10.7 g/dL — ABNORMAL LOW (ref 12.0–15.0)
MCH: 30.1 pg (ref 26.0–34.0)
MCHC: 33.5 g/dL (ref 30.0–36.0)
MCV: 89.9 fL (ref 78.0–100.0)
Platelets: 181 10*3/uL (ref 150–400)
RBC: 3.55 MIL/uL — ABNORMAL LOW (ref 3.87–5.11)
RDW: 13.3 % (ref 11.5–15.5)
WBC: 14.5 10*3/uL — ABNORMAL HIGH (ref 4.0–10.5)

## 2013-05-31 LAB — BASIC METABOLIC PANEL WITH GFR
BUN: 34 mg/dL — ABNORMAL HIGH (ref 6–23)
CO2: 22 meq/L (ref 19–32)
Calcium: 8.8 mg/dL (ref 8.4–10.5)
Chloride: 101 meq/L (ref 96–112)
Creatinine, Ser: 1.21 mg/dL — ABNORMAL HIGH (ref 0.50–1.10)
GFR calc Af Amer: 46 mL/min — ABNORMAL LOW
GFR calc non Af Amer: 40 mL/min — ABNORMAL LOW
Glucose, Bld: 166 mg/dL — ABNORMAL HIGH (ref 70–99)
Potassium: 4.4 meq/L (ref 3.5–5.1)
Sodium: 131 meq/L — ABNORMAL LOW (ref 135–145)

## 2013-05-31 NOTE — Progress Notes (Signed)
   Subjective: 1 Day Post-Op Procedure(s) (LRB): LEFT TOTAL KNEE REVISION (Left)   Patient reports pain as mild, pain well controlled. No events throughout the night. Says that she is ready to start PT and see how the knee feels.  Objective:   VITALS:   Filed Vitals:   05/31/13 0615  BP: 116/66  Pulse: 72  Temp: 97.8 F (36.6 C)  Resp: 16    Neurovascular intact Dorsiflexion/Plantar flexion intact Incision: dressing C/D/I No cellulitis present Compartment soft  LABS  Recent Labs  05/31/13 0410  HGB 10.7*  HCT 31.9*  WBC 14.5*  PLT 181     Recent Labs  05/31/13 0410  NA 131*  K 4.4  BUN 34*  CREATININE 1.21*  GLUCOSE 166*     Assessment/Plan: 1 Day Post-Op Procedure(s) (LRB): LEFT TOTAL KNEE REVISION (Left) HV drain d/c'ed Foley cath d/c'ed Advance diet Up with therapy D/C IV fluids Discharge to SNF [facility]) when ready  Expected ABLA  Treated with iron and will observe  Overweight (BMI 25-29.9) Estimated body mass index is 26.1 kg/(m^2) as calculated from the following:   Height as of this encounter: 5\' 4"  (1.626 m).   Weight as of this encounter: 69 kg (152 lb 1.9 oz). Patient also counseled that weight may inhibit the healing process Patient counseled that losing weight will help with future health issues  Hyponatremia Treated with IV fluids and will observe       Anastasio Auerbach. Miko Markwood   PAC  05/31/2013, 9:24 AM

## 2013-05-31 NOTE — Anesthesia Postprocedure Evaluation (Signed)
  Anesthesia Post-op Note  Patient: [name]  Procedure(s) Performed: Procedure(s) (LRB): LEFT TOTAL KNEE REVISION (Left)  Patient Location: PACU  Anesthesia Type: Spinal  Level of Consciousness: awake and alert   Airway and Oxygen Therapy: Patient Spontanous Breathing  Post-op Pain: mild  Post-op Assessment: Post-op Vital signs reviewed, Patient's Cardiovascular Status Stable, Respiratory Function Stable, Patent Airway and No signs of Nausea or vomiting  Last Vitals:  Filed Vitals:   05/31/13 1340  BP: 110/47  Pulse: 62  Temp: 36.8 C  Resp: 16    Post-op Vital Signs: stable   Complications: No apparent anesthesia complications

## 2013-05-31 NOTE — Evaluation (Signed)
Physical Therapy Evaluation Patient Details Name: [name] MRN: [id number] DOB: 01-Aug-1925 Today's Date: 05/31/2013 Time: 4098-1191 PT Time Calculation (min): 25 min  PT Assessment / Plan / Recommendation History of Present Illness  POD #1 for L TKA revision  Clinical Impression  **Pt moving well for POD #1. She walked 39' with RW and min A. L knee AAROM 0-75 degrees. Good progress expected. *    PT Assessment  Patient needs continued PT services    Follow Up Recommendations  SNF;Supervision for mobility/OOB    Does the patient have the potential to tolerate intense rehabilitation      Barriers to Discharge        Equipment Recommendations  None recommended by PT    Recommendations for Other Services     Frequency 7X/week    Precautions / Restrictions Precautions Precautions: Knee Restrictions Weight Bearing Restrictions: No Other Position/Activity Restrictions: WBAT   Pertinent Vitals/Pain *6/10 L knee with activity Ice applied premedicated**      Mobility  Bed Mobility Bed Mobility: Supine to Sit Supine to Sit: 4: Min assist Details for Bed Mobility Assistance: Min A LLE Transfers Transfers: Sit to Stand;Stand to Sit Sit to Stand: 4: Min assist;From bed;With upper extremity assist Stand to Sit: 4: Min assist;To chair/3-in-1;With armrests Details for Transfer Assistance: VCs hand placement Ambulation/Gait Ambulation/Gait Assistance: 4: Min guard Ambulation Distance (Feet): 60 Feet Assistive device: Rolling walker Gait Pattern: Step-to pattern General Gait Details: VCs for sequencing    Exercises Total Joint Exercises Ankle Circles/Pumps: AROM;Both;10 reps;Supine Quad Sets: AROM;Left;10 reps;Supine Heel Slides: AAROM;Left;10 reps;Supine   PT Diagnosis: Difficulty walking;Acute pain  PT Problem List: Decreased strength;Decreased range of motion;Decreased activity tolerance;Pain PT Treatment Interventions: DME instruction;Gait training;Stair  training;Therapeutic exercise;Patient/family education     PT Goals(Current goals can be found in the care plan section) Acute Rehab PT Goals Patient Stated Goal: working in the yard, housework PT Goal Formulation: With patient/family Time For Goal Achievement: 06/14/13 Potential to Achieve Goals: Good  Visit Information  Last PT Received On: 05/31/13 Assistance Needed: +1 History of Present Illness: POD #1 for L TKA revision       Prior Functioning  Home Living Family/patient expects to be discharged to:: Private residence Living Arrangements: Alone Available Help at Discharge: Available 24 hours/day;Family (daughter lives behind pt.  Pt plans to DC to SNF.) Home Access: Stairs to enter Entrance Stairs-Number of Steps: 1 Home Layout: Two level;Able to live on main level with bedroom/bathroom Home Equipment: Gilmer Mor - single point;Walker - 2 wheels;Walker - 4 wheels;Shower seat Additional Comments: handicapped height commode, tub with shower, no grab bars in bathroom Prior Function Level of Independence: Independent Comments: no falls PTA, independent with bathing/dressing, does a little driving Communication Communication: No difficulties    Cognition  Cognition Arousal/Alertness: Awake/alert Behavior During Therapy: WFL for tasks assessed/performed Overall Cognitive Status: Within Functional Limits for tasks assessed    Extremity/Trunk Assessment Upper Extremity Assessment Upper Extremity Assessment: Overall WFL for tasks assessed Lower Extremity Assessment Lower Extremity Assessment: LLE deficits/detail LLE Deficits / Details: knee flexion AAROM 75 degrees, extension AAROM 0 degrees, strength +3/5 L knee; ankle WNL Cervical / Trunk Assessment Cervical / Trunk Assessment: Normal   Balance    End of Session PT - End of Session Equipment Utilized During Treatment: Gait belt Activity Tolerance: Patient tolerated treatment well Patient left: in chair;with call bell/phone  within reach;with family/visitor present Nurse Communication: Mobility status  GP     Ralene Bathe Kistler 05/31/2013, 10:10  AM [phone]

## 2013-05-31 NOTE — Evaluation (Signed)
Occupational Therapy Evaluation Patient Details Name: [name] MRN: [id number] DOB: 10/10/1925 Today's Date: 05/31/2013 Time: 0865-7846 OT Time Calculation (min): 21 min  OT Assessment / Plan / Recommendation History of present illness POD #1 for L TKA revision   Clinical Impression   Pt doing well. Overall is min to mod assist with ADL and will benefit from skilled OT services to improve ADL independence for next venue of care.    OT Assessment  Patient needs continued OT Services    Follow Up Recommendations  SNF;Supervision/Assistance - 24 hour    Barriers to Discharge      Equipment Recommendations  None recommended by OT    Recommendations for Other Services    Frequency  Min 2X/week    Precautions / Restrictions Precautions Precautions: Knee Restrictions Weight Bearing Restrictions: No Other Position/Activity Restrictions: WBAT   Pertinent Vitals/Pain 5/10 reposition, ice    ADL  Eating/Feeding: Simulated;Independent Where Assessed - Eating/Feeding: Chair Grooming: Performed;Min guard Where Assessed - Grooming: Unsupported standing Upper Body Bathing: Simulated;Chest;Right arm;Left arm;Abdomen;Set up Where Assessed - Upper Body Bathing: Unsupported sitting Lower Body Bathing: Simulated;Minimal assistance Where Assessed - Lower Body Bathing: Supported sit to stand Upper Body Dressing: Simulated;Set up Where Assessed - Upper Body Dressing: Unsupported sitting Lower Body Dressing: Simulated;Moderate assistance Where Assessed - Lower Body Dressing: Supported sit to Pharmacist, hospital: Performed;Minimal Web designer: Comfort height toilet;Grab bars Toileting - Architect and Hygiene: Simulated;Minimal assistance Where Assessed - Engineer, mining and Hygiene: Sit to stand from 3-in-1 or toilet Equipment Used: Rolling walker    OT Diagnosis: Generalized weakness  OT Problem List: Decreased  strength;Decreased knowledge of use of DME or AE OT Treatment Interventions: Self-care/ADL training;DME and/or AE instruction;Therapeutic activities   OT Goals(Current goals can be found in the care plan section) Acute Rehab OT Goals Patient Stated Goal: working in the yard, housework OT Goal Formulation: With patient Time For Goal Achievement: 06/07/13 Potential to Achieve Goals: Good  Visit Information  Last OT Received On: 05/31/13 Assistance Needed: +1 History of Present Illness: POD #1 for L TKA revision       Prior Functioning     Home Living Family/patient expects to be discharged to:: Private residence Living Arrangements: Alone Available Help at Discharge: Available 24 hours/day;Family (daughter lives behind pt.  Pt plans to DC to SNF.) Home Access: Stairs to enter Entrance Stairs-Number of Steps: 1 Home Layout: Two level;Able to live on main level with bedroom/bathroom Home Equipment: Gilmer Mor - single point;Walker - 2 wheels;Walker - 4 wheels;Shower seat Additional Comments: handicapped height commode, tub with shower, no grab bars in bathroom Prior Function Level of Independence: Independent Comments: no falls PTA, independent with bathing/dressing, does a little driving Communication Communication: No difficulties         Vision/Perception     Cognition  Cognition Arousal/Alertness: Awake/alert Behavior During Therapy: WFL for tasks assessed/performed Overall Cognitive Status: Within Functional Limits for tasks assessed    Extremity/Trunk Assessment Upper Extremity Assessment Upper Extremity Assessment: Overall WFL for tasks assessed     Mobility  Transfers Transfers: Sit to Stand;Stand to Sit Sit to Stand: 4: Min assist;With upper extremity assist;From chair/3-in-1;From toilet Stand to Sit: 4: Min assist;With upper extremity assist;To chair/3-in-1;To toilet Details for Transfer Assistance: VCs hand placement        Balance Balance Balance  Assessed: Yes Dynamic Standing Balance Dynamic Standing - Level of Assistance: 4: Min assist (min guard)   End of Session OT - End  of Session Activity Tolerance: Patient tolerated treatment well Patient left: in chair;with call bell/phone within reach;with family/visitor present  GO     Lennox Laity [phone] 05/31/2013, 11:46 AM

## 2013-05-31 NOTE — Progress Notes (Signed)
Clinical Social Work Department CLINICAL SOCIAL WORK PLACEMENT NOTE 05/31/2013  Patient:  [name], [name]  Account Number:  [id number] Admit date:  05/30/2013  Clinical Social Worker:  Cori Razor, LCSW  Date/time:  05/31/2013 01:11 PM  Clinical Social Work is seeking post-discharge placement for this patient at the following level of care:   SKILLED NURSING   (*CSW will update this form in Epic as items are completed)     Patient/family provided with [facility] Department of Clinical Social Work's list of facilities offering this level of care within the geographic area requested by the patient (or if unable, by the patient's family).  05/31/2013  Patient/family informed of their freedom to choose among providers that offer the needed level of care, that participate in Medicare, Medicaid or managed care program needed by the patient, have an available bed and are willing to accept the patient.    Patient/family informed of [facility]' ownership interest in [facility], as well as of the fact that they are under no obligation to receive care at this facility.  PASARR submitted to [employer] on  PASARR number received from [employer] on 10/02/2005  FL2 transmitted to all facilities in geographic area requested by pt/family on  05/31/2013 FL2 transmitted to all facilities within larger geographic area on   Patient informed that his/her managed care company has contracts with or will negotiate with  certain facilities, including the following:     Patient/family informed of bed offers received:  05/31/2013 Patient chooses bed at [facility] Physician recommends and patient chooses bed at    Patient to be transferred to [facility] on   Patient to be transferred to facility by   The following physician request were entered in Epic:   Additional Comments:   Cori Razor LCSW [phone]

## 2013-05-31 NOTE — Progress Notes (Signed)
Clinical Social Work Department BRIEF PSYCHOSOCIAL ASSESSMENT 05/31/2013  Patient:  [name], [name]     Account Number:  [id number]     Admit date:  05/30/2013  Clinical Social Worker:  Candie Chroman  Date/Time:  05/31/2013 12:54 PM  Referred by:  Physician  Date Referred:  05/31/2013 Referred for  SNF Placement   Other Referral:   Interview type:  Patient Other interview type:    PSYCHOSOCIAL DATA Living Status:  WITH ADULT CHILDREN Admitted from facility:   Level of care:   Primary support name:  [name] Primary support relationship to patient:  CHILD, ADULT Degree of support available:   supportive    CURRENT CONCERNS Current Concerns  Post-Acute Placement   Other Concerns:    SOCIAL WORK ASSESSMENT / PLAN Pt is an 79 yr old female living at home prior to hospitalization. CSW met with pt / daughter to assist with d/c planning. Pt has made prior arrangements to have ST Rehab at [facility] following hospital d/c. CSW has contacted SNF and d/c plans have been confirmed. CSW will continue to follow to assist with d/c planning.   Assessment/plan status:  Psychosocial Support/Ongoing Assessment of Needs Other assessment/ plan:   Information/referral to community resources:   Insurance coverage for SNF and ambulance transport reviewed with pt / daughter. They are aware that insurance may not cover cost of [place].    PATIENT'S/FAMILY'S RESPONSE TO PLAN OF CARE: Pt is looking forward to having rehab at [facility].   Cori Razor LCSW [phone]

## 2013-05-31 NOTE — Op Note (Signed)
NAMEJERALYN, [name]                ACCOUNT NO.:  [id number]  MEDICAL RECORD NO.:  [id number]  LOCATION:  1606                         FACILITY:  [facility]  PHYSICIAN:  Madlyn Frankel. Charlann Boxer, M.D.  DATE OF BIRTH:  February 10, 1925  DATE OF PROCEDURE:  05/30/2013 DATE OF DISCHARGE:  05/30/2013                              OPERATIVE REPORT   PREOPERATIVE DIAGNOSIS:  Failed left total knee replacement.  POSTOPERATIVE DIAGNOSIS:  Failed left total knee replacement.  PROCEDURE:  Revision left total knee replacement.  COMPONENTS USED:  DePuy posterior stabilized femur size 2.5 with TC3 component with a size 2.5 MBT revision tray 29 cemented sleeve with 5 mm medial and lateral augments on the tibia and a 12.5 posterior stabilized insert and 38 patellar button.  SURGEON:  Madlyn Frankel. Charlann Boxer, M.D.  ASSISTANT:  Lanney Gins, PA-C.  Note that Mr. Carmon Sails was present for the entirety of case for perioperative management of the operative extremity, general facilitation of the case, and primary wound closure.  ANESTHESIA:  Spinal.  SPECIMENS:  None.  COMPLICATIONS:  None.  DRAINS:  One medium Hemovac.  TOURNIQUET TIME:  54 minutes at 250 mmHg.  INDICATIONS FOR PROCEDURE:  [name] is an 79 year old female with a history of left total knee replacement about 7 years ago.  She has unfortunately had difficulty and complaints of pain over the years.  We have worked it up several times for infection and loosening, all coming back negative each time including 2 normal bone scans and labs are within normal limits.  No evidence any elevated CRP, C-reactive protein, or sedimentation rate.  We have thus had a very long discussions about how to manage her knee. Given the fact that in the interim had a right total knee replacement that has not bothered her, she has elected at this point to undergo revision of left knee surgery.  I have given her only a 50% chance of reliable benefit as I have not been able to  identify the source of her pain.  Nonetheless, she understands this risk in addition to the standard risks of infection and DVT, and she would like to proceed in the hopes that she will be better.  She understands there is a risk that she may be the same that she is now.  Despite this lengthy discussion, she is ready to proceed in this fashion.  Consent was obtained for benefit of family.  PROCEDURE IN DETAIL:  Patient was brought to the operative theater. Once adequate anesthesia, preoperative antibiotics, Ancef administered as well as tranexamic acid and Decadron.  She was positioned supine with a left thigh tourniquet placed.  Left lower extremity was then prepped and draped in sterile fashion.  The time-out was performed identifying the patient, the planned procedure, and the extremity.  The leg was exsanguinated.  Tourniquet elevated to 250 mmHg.  The patient's old incision was incised and soft tissue planes created. Median arthrotomy was made encountering clear synovial fluid.  Once the soft tissue dissection was carried out, synovectomy carried out. Proximal and medial peel carried out.  I removed the old polyethylene insert from the tibia.  I used a thin ACL saw and undermined  the tibia surface that loosened the tibia without loss of bone as well as the femoral side medially and lateral along the undersurfaces of the components.  The femoral component was removed without bone loss.  Evaluating the patella, we found some delamination to the apex of the patellar button and it was difficult to ascertain where this could have come from.  She has not had a tracking abnormality nor was there identified one intraoperatively.  Nonetheless, the patella was revised by removing the old patellar button cement.  New patellar button was positioned and lug holes drilled for size 38 patellar button.  Once the components were removed, attention was first directed to the tibia component, and  after only removing a little bit of bone remaining in the lateral side of the proximal tibia, we sized the cut surface to be best fit with size 2.5.  The 2.5 tray was pinned into position, drilled for an MBT revision tray and I broached it for the 29 cemented sleeve.  When the trial component was placed, we attended to the femoral component.  I went ahead and reamed up to 15 mm in case I needed a cemented stem.  With the intramedullary rod in place, I confirmed initially that a 3 component will work well.  I then downsized it to 2.5.  This fit best without having to remove any bone on the anterior or posterior aspect of the femur.  Chamfer cuts revisited.  TC3 box cut was made for added cement support.  At this point, trial reduction was carried out.  Finally, the knee was hyperextended without augments distally with 15 mm insert.  Given all these findings, I removed the trial components, selected the final components as noted above.  Cement restrictor was placed in the proximal tibia and distal femur to prevent cement extrusion into the canal.  Final components were cemented into position.  The knee was brought to extension with a 12.5 insert.  With this, the knee remained in extension until the cement fully cured.  Once cement had cured, excessive cement was removed throughout the knee.  The final 12.5 insert to match the 2.5 femoral component was inserted.  The knee was stable from extension to flexion.  The patella tracked through the trochlea without application of pressure.  We re-irrigated the knee at this point.  Tourniquet had been let down after 54 minutes.  I placed a medium Hemovac drain deep. The extensor mechanism was then reapproximated using #1 Vicryl and a 0 V- Loc suture with the knee in flexion.  The remainder of the wound was closed with 2-0 Vicryl and running 4-0 Monocryl.  The knee was cleaned, dried, and dressed sterilely using the Dermabond Aquacel  dressing. Drain site dressed separately.  The patient was then brought to the recovery room in stable condition tolerating the procedure well.  She was planning on going to [facility].  We will see her back in the office for routine followup in 2 weeks.     Madlyn Frankel Charlann Boxer, M.D.     MDO/MEDQ  D:  05/30/2013  T:  05/31/2013  Job:  [id number]

## 2013-05-31 NOTE — Progress Notes (Signed)
Physical Therapy Treatment Patient Details Name: [name] MRN: [id number] DOB: 1925-09-04 Today's Date: 05/31/2013 Time: 2952-8413 PT Time Calculation (min): 24 min  PT Assessment / Plan / Recommendation  History of Present Illness POD #1 for L TKA revision   PT Comments   **Pt progressing well with mobility. She ambulated 45' with RW. Performed L TKA exercises with assist. *  Follow Up Recommendations  SNF;Supervision for mobility/OOB     Does the patient have the potential to tolerate intense rehabilitation     Barriers to Discharge        Equipment Recommendations  None recommended by PT    Recommendations for Other Services    Frequency 7X/week   Progress towards PT Goals Progress towards PT goals: Progressing toward goals  Plan Current plan remains appropriate    Precautions / Restrictions Precautions Precautions: Knee Restrictions Weight Bearing Restrictions: No Other Position/Activity Restrictions: WBAT   Pertinent Vitals/Pain **6/10 L knee with walking Premedicated, ice applied*    Mobility  Bed Mobility Bed Mobility: Not assessed Supine to Sit: 4: Min assist Details for Bed Mobility Assistance: Min A LLE Transfers Transfers: Sit to Stand;Stand to Sit Sit to Stand: 4: Min assist;With upper extremity assist;From chair/3-in-1;With armrests Stand to Sit: To chair/3-in-1;With armrests;With upper extremity assist;4: Min guard Details for Transfer Assistance: VCs hand placement Ambulation/Gait Ambulation/Gait Assistance: 4: Min guard Ambulation Distance (Feet): 65 Feet Assistive device: Rolling walker Gait Pattern: Step-to pattern General Gait Details: VCs for sequencing    Exercises Total Joint Exercises Ankle Circles/Pumps: AROM;Both;10 reps;Supine Quad Sets: AROM;10 reps;Supine;Both Towel Squeeze: AROM;Both;10 reps Short Arc QuadBarbaraann Boys;Left;10 reps Heel Slides: AAROM;Left;10 reps;Supine Hip ABduction/ADduction: Left;10 reps;AAROM Straight Leg  Raises: AAROM;Left;10 reps Goniometric ROM: -10-70 L knee AAROM   PT Diagnosis: Difficulty walking;Acute pain  PT Problem List: Decreased strength;Decreased range of motion;Decreased activity tolerance;Pain PT Treatment Interventions: DME instruction;Gait training;Stair training;Therapeutic exercise;Patient/family education   PT Goals (current goals can now be found in the care plan section) Acute Rehab PT Goals Patient Stated Goal: working in the yard, housework PT Goal Formulation: With patient/family Time For Goal Achievement: 06/14/13 Potential to Achieve Goals: Good  Visit Information  Last PT Received On: 05/31/13 Assistance Needed: +1 History of Present Illness: POD #1 for L TKA revision    Subjective Data  Patient Stated Goal: working in the yard, housework   Cognition  Cognition Arousal/Alertness: Awake/alert Behavior During Therapy: WFL for tasks assessed/performed Overall Cognitive Status: Within Functional Limits for tasks assessed    Balance  Balance Balance Assessed: Yes Dynamic Standing Balance Dynamic Standing - Level of Assistance: 4: Min assist (min guard)  End of Session PT - End of Session Equipment Utilized During Treatment: Gait belt Activity Tolerance: Patient tolerated treatment well Patient left: in chair;with call bell/phone within reach;with family/visitor present Nurse Communication: Mobility status   GP     Ralene Bathe Kistler 05/31/2013, 1:59 PM [phone]

## 2013-05-31 NOTE — Progress Notes (Signed)
Utilization review completed.  

## 2013-06-01 LAB — CBC
HCT: 33.4 % — ABNORMAL LOW (ref 36.0–46.0)
Hemoglobin: 11.2 g/dL — ABNORMAL LOW (ref 12.0–15.0)
MCH: 30.1 pg (ref 26.0–34.0)
MCHC: 33.5 g/dL (ref 30.0–36.0)
MCV: 89.8 fL (ref 78.0–100.0)
Platelets: 187 10*3/uL (ref 150–400)
RBC: 3.72 MIL/uL — ABNORMAL LOW (ref 3.87–5.11)
RDW: 13.1 % (ref 11.5–15.5)
WBC: 13.3 10*3/uL — ABNORMAL HIGH (ref 4.0–10.5)

## 2013-06-01 LAB — BASIC METABOLIC PANEL
BUN: 42 mg/dL — ABNORMAL HIGH (ref 6–23)
CO2: 24 mEq/L (ref 19–32)
Calcium: 9.1 mg/dL (ref 8.4–10.5)
Chloride: 100 mEq/L (ref 96–112)
Creatinine, Ser: 1.41 mg/dL — ABNORMAL HIGH (ref 0.50–1.10)
GFR calc Af Amer: 38 mL/min — ABNORMAL LOW (ref >90)
GFR calc non Af Amer: 33 mL/min — ABNORMAL LOW (ref >90)
Glucose, Bld: 125 mg/dL — ABNORMAL HIGH (ref 70–99)
Potassium: 4.3 mEq/L (ref 3.5–5.1)
Sodium: 133 mEq/L — ABNORMAL LOW (ref 135–145)

## 2013-06-01 NOTE — Progress Notes (Signed)
   Subjective: 2 Days Post-Op Procedure(s) (LRB): LEFT TOTAL KNEE REVISION (Left)   Patient reports pain as mild, pain controlled well. Feels like she has been doing well with PT. No events throughout the night.  Objective:   VITALS:   Filed Vitals:   06/01/13 0500  BP: 146/57  Pulse: 56  Temp: 97.8 F (36.6 C)  Resp: 18    Neurovascular intact Dorsiflexion/Plantar flexion intact Incision: dressing C/D/I No cellulitis present Compartment soft  LABS  Recent Labs  05/31/13 0410 06/01/13 0405  HGB 10.7* 11.2*  HCT 31.9* 33.4*  WBC 14.5* 13.3*  PLT 181 187     Recent Labs  05/31/13 0410 06/01/13 0405  NA 131* 133*  K 4.4 4.3  BUN 34* 42*  CREATININE 1.21* 1.41*  GLUCOSE 166* 125*     Assessment/Plan: 2 Days Post-Op Procedure(s) (LRB): LEFT TOTAL KNEE REVISION (Left) Up with therapy Discharge to SNF eventually, when ready  Expected ABLA  Treated with iron and will observe   Overweight (BMI 25-29.9)  Estimated body mass index is 26.1 kg/(m^2) as calculated from the following:      Height as of this encounter: 5\' 4"  (1.626 m).      Weight as of this encounter: 69 kg (152 lb 1.9 oz).  Patient also counseled that weight may inhibit the healing process  Patient counseled that losing weight will help with future health issues   Hyponatremia  Treated with IV fluids and will observe    Anastasio Auerbach. [name]   PAC  06/01/2013, 2:20 PM

## 2013-06-01 NOTE — Progress Notes (Signed)
Physical Therapy Treatment Patient Details Name: [name] MRN: [id number] DOB: January 27, 1925 Today's Date: 06/01/2013 Time: 4098-1191 PT Time Calculation (min): 32 min  PT Assessment / Plan / Recommendation  History of Present Illness L TKA      Follow Up Recommendations  SNF;Supervision for mobility/OOB     Does the patient have the potential to tolerate intense rehabilitation     Barriers to Discharge        Equipment Recommendations  None recommended by PT    Recommendations for Other Services    Frequency 7X/week   Progress towards PT Goals Progress towards PT goals: Progressing toward goals  Plan Current plan remains appropriate    Precautions / Restrictions Precautions Precautions: Knee Restrictions Weight Bearing Restrictions: No Other Position/Activity Restrictions: WBAT   Pertinent Vitals/Pain 9/10-Ice provided and premedicated    Mobility  Bed Mobility Bed Mobility: Not assessed Transfers Transfers: Sit to Stand;Stand to Sit Sit to Stand: 4: Min guard;With upper extremity assist;From chair/3-in-1;From toilet Stand to Sit: 4: Min guard;With upper extremity assist;With armrests;To chair/3-in-1;To toilet Details for Transfer Assistance: VCs hand placement Ambulation/Gait Ambulation/Gait Assistance: 5: Supervision Ambulation Distance (Feet): 200 Feet Assistive device: Rolling walker Gait Pattern: Step-to pattern Stairs: No    Exercises Total Joint Exercises Ankle Circles/Pumps: AROM;Both;10 reps;Supine Quad Sets: AROM;10 reps;Supine;Both Towel Squeeze: AROM;Both;10 reps Short Arc Quad: AROM;Left;10 reps;Supine Straight Leg Raises: AROM;Left;10 reps;Supine Long Arc Quad: AROM;Left;10 reps;Seated Knee Flexion: Left;10 reps;AAROM;Seated   PT Diagnosis:    PT Problem List:   PT Treatment Interventions:     PT Goals (current goals can now be found in the care plan section) Acute Rehab PT Goals Patient Stated Goal: working in the yard,  housework  Visit Information  Last PT Received On: 06/01/13 Assistance Needed: +1 History of Present Illness: L TKA    Subjective Data  Patient Stated Goal: working in the yard, housework   Cognition  Cognition Arousal/Alertness: Awake/alert Behavior During Therapy: WFL for tasks assessed/performed Overall Cognitive Status: Within Functional Limits for tasks assessed         End of Session PT - End of Session Equipment Utilized During Treatment: Gait belt Activity Tolerance: Patient tolerated treatment well Patient left: in chair;with call bell/phone within reach;with family/visitor present  [employer]     [name] 06/01/2013, 11:47 AM

## 2013-06-02 ENCOUNTER — Other Ambulatory Visit: Payer: Self-pay | Admitting: Geriatric Medicine

## 2013-06-02 DIAGNOSIS — K5900 Constipation, unspecified: Secondary | ICD-10-CM | POA: Diagnosis not present

## 2013-06-02 DIAGNOSIS — R279 Unspecified lack of coordination: Secondary | ICD-10-CM | POA: Diagnosis not present

## 2013-06-02 DIAGNOSIS — S8990XA Unspecified injury of unspecified lower leg, initial encounter: Secondary | ICD-10-CM | POA: Diagnosis not present

## 2013-06-02 DIAGNOSIS — S99929A Unspecified injury of unspecified foot, initial encounter: Secondary | ICD-10-CM | POA: Diagnosis not present

## 2013-06-02 DIAGNOSIS — M25569 Pain in unspecified knee: Secondary | ICD-10-CM | POA: Diagnosis not present

## 2013-06-02 DIAGNOSIS — H04129 Dry eye syndrome of unspecified lacrimal gland: Secondary | ICD-10-CM | POA: Diagnosis not present

## 2013-06-02 DIAGNOSIS — K219 Gastro-esophageal reflux disease without esophagitis: Secondary | ICD-10-CM | POA: Diagnosis not present

## 2013-06-02 DIAGNOSIS — D649 Anemia, unspecified: Secondary | ICD-10-CM | POA: Diagnosis not present

## 2013-06-02 DIAGNOSIS — I10 Essential (primary) hypertension: Secondary | ICD-10-CM | POA: Diagnosis not present

## 2013-06-02 DIAGNOSIS — I2510 Atherosclerotic heart disease of native coronary artery without angina pectoris: Secondary | ICD-10-CM | POA: Diagnosis not present

## 2013-06-02 DIAGNOSIS — Z471 Aftercare following joint replacement surgery: Secondary | ICD-10-CM | POA: Diagnosis not present

## 2013-06-02 DIAGNOSIS — IMO0002 Reserved for concepts with insufficient information to code with codable children: Secondary | ICD-10-CM | POA: Diagnosis not present

## 2013-06-02 DIAGNOSIS — M1710 Unilateral primary osteoarthritis, unspecified knee: Secondary | ICD-10-CM | POA: Diagnosis not present

## 2013-06-02 DIAGNOSIS — M6281 Muscle weakness (generalized): Secondary | ICD-10-CM | POA: Diagnosis not present

## 2013-06-02 DIAGNOSIS — E785 Hyperlipidemia, unspecified: Secondary | ICD-10-CM | POA: Diagnosis not present

## 2013-06-02 DIAGNOSIS — M1990 Unspecified osteoarthritis, unspecified site: Secondary | ICD-10-CM | POA: Diagnosis not present

## 2013-06-02 DIAGNOSIS — R269 Unspecified abnormalities of gait and mobility: Secondary | ICD-10-CM | POA: Diagnosis not present

## 2013-06-02 DIAGNOSIS — D500 Iron deficiency anemia secondary to blood loss (chronic): Secondary | ICD-10-CM | POA: Diagnosis not present

## 2013-06-02 MED ORDER — TIZANIDINE HCL 4 MG PO CAPS: 4.0000 mg | ORAL_CAPSULE | Freq: Three times a day (TID) | ORAL | Status: DC | PRN

## 2013-06-02 MED ORDER — ASPIRIN 325 MG PO TBEC: 325.0000 mg | DELAYED_RELEASE_TABLET | Freq: Two times a day (BID) | ORAL | Status: AC

## 2013-06-02 MED ORDER — POLYETHYLENE GLYCOL 3350 17 G PO PACK: 17.0000 g | PACK | Freq: Two times a day (BID) | ORAL | Status: DC

## 2013-06-02 MED ORDER — HYDROCODONE-ACETAMINOPHEN 7.5-325 MG PO TABS: 1.0000 | ORAL_TABLET | ORAL | Status: DC | PRN

## 2013-06-02 MED ORDER — FERROUS SULFATE 325 (65 FE) MG PO TABS: 325.0000 mg | ORAL_TABLET | Freq: Three times a day (TID) | ORAL | Status: DC

## 2013-06-02 MED ORDER — DSS 100 MG PO CAPS: 100.0000 mg | ORAL_CAPSULE | Freq: Two times a day (BID) | ORAL | Status: DC

## 2013-06-02 NOTE — Progress Notes (Signed)
Physical Therapy Treatment Patient Details Name: [name] MRN: [id number] DOB: October 10, 1925 Today's Date: 06/02/2013 Time: 9562-1308 PT Time Calculation (min): 26 min  PT Assessment / Plan / Recommendation  History of Present Illness L TKA   PT Comments     Follow Up Recommendations  SNF;Supervision for mobility/OOB     Does the patient have the potential to tolerate intense rehabilitation     Barriers to Discharge        Equipment Recommendations  None recommended by PT    Recommendations for Other Services    Frequency 7X/week   Progress towards PT Goals Progress towards PT goals: Progressing toward goals  Plan Current plan remains appropriate    Precautions / Restrictions Precautions Precautions: Knee Restrictions Other Position/Activity Restrictions: WBAT   Pertinent Vitals/Pain No c/o pain    Mobility  Bed Mobility Bed Mobility: Not assessed Transfers Transfers: Sit to Stand;Stand to Sit Sit to Stand: 5: Supervision;4: Min guard;From chair/3-in-1 Stand to Sit: 4: Min guard;5: Supervision;To bed;With upper extremity assist Details for Transfer Assistance: VCs hand placement Ambulation/Gait Ambulation/Gait Assistance: 5: Supervision Ambulation Distance (Feet): 160 Feet Assistive device: Rolling walker Ambulation/Gait Assistance Details: cues for RW safety, min/guard during turns Gait Pattern: Step-to pattern;Step-through pattern;Decreased stride length    Exercises Total Joint Exercises Ankle Circles/Pumps: AROM;Both;10 reps Quad Sets: AROM;Left;10 reps Short Arc Quad: AAROM;AROM;Left;10 reps Heel Slides: AAROM;Left;10 reps Hip ABduction/ADduction: Left;10 reps;AAROM Straight Leg Raises: Left;AROM;AAROM;10 reps   PT Diagnosis:    PT Problem List:   PT Treatment Interventions:     PT Goals (current goals can now be found in the care plan section) Acute Rehab PT Goals Patient Stated Goal: working in the yard, housework Time For Goal Achievement:  06/14/13 Potential to Achieve Goals: Good  Visit Information  Last PT Received On: 06/02/13 Assistance Needed: +1 History of Present Illness: L TKA    Subjective Data  Patient Stated Goal: working in the yard, housework   Cognition  Cognition Arousal/Alertness: Awake/alert Behavior During Therapy: WFL for tasks assessed/performed Overall Cognitive Status: Within Functional Limits for tasks assessed    Balance     End of Session PT - End of Session Activity Tolerance: Patient tolerated treatment well Patient left: in chair;with call bell/phone within reach;with family/visitor present Nurse Communication: Mobility status   GP     [facility] 06/02/2013, 9:30 AM

## 2013-06-02 NOTE — Progress Notes (Signed)
Attempted to call report to [facility], placed on hold for several minutes.  Sharrell Ku RN

## 2013-06-02 NOTE — Progress Notes (Signed)
   Subjective: 3 Days Post-Op Procedure(s) (LRB): LEFT TOTAL KNEE REVISION (Left)   Patient reports pain as mild, pain well controlled. No events throughout the night. Ready to be discharged to SNF.  Objective:   VITALS:   Filed Vitals:   06/02/13 0628  BP: 152/58  Pulse: 59  Temp: 97.4 F (36.3 C)  Resp: 17    Neurovascular intact Dorsiflexion/Plantar flexion intact Incision: dressing C/D/I No cellulitis present Compartment soft  LABS  Recent Labs  05/31/13 0410 06/01/13 0405  HGB 10.7* 11.2*  HCT 31.9* 33.4*  WBC 14.5* 13.3*  PLT 181 187     Recent Labs  05/31/13 0410 06/01/13 0405  NA 131* 133*  K 4.4 4.3  BUN 34* 42*  CREATININE 1.21* 1.41*  GLUCOSE 166* 125*     Assessment/Plan: 3 Days Post-Op Procedure(s) (LRB): LEFT TOTAL KNEE REVISION (Left) Up with therapy Discharge to SNF Patient encouraged to drink fluids to rehydrate herself Follow up in 2 weeks at [facility]. Follow up with OLIN,[name] D in 2 weeks.  Contact information:  [facility] [address] [place] [address] [phone]    Expected ABLA  Treated with iron and will observe   Overweight (BMI 25-29.9)  Estimated body mass index is 26.1 kg/(m^2) as calculated from the following:      Height as of this encounter: 5\' 4"  (1.626 m).      Weight as of this encounter: 69 kg (152 lb 1.9 oz).  Patient also counseled that weight may inhibit the healing process  Patient counseled that losing weight will help with future health issues   Hyponatremia  Treated with IV fluids and will observe     Anastasio Auerbach. [name]   PAC  06/02/2013, 9:28 AM

## 2013-06-02 NOTE — Discharge Summary (Signed)
Physician Discharge Summary  Patient ID: [name] MRN: [id number] DOB/AGE: 02-17-1925 79 y.o.  Admit date: 05/30/2013 Discharge date:  06/02/2013  Procedures:  Procedure(s) (LRB): LEFT TOTAL KNEE REVISION (Left)  Attending Physician:  Dr. Durene Romans   Admission Diagnoses:   Left knee pain, s/p left total knee arthroplasty.  Discharge Diagnoses:  Principal Problem:   S/P left TK revision Active Problems:   Expected blood loss anemia   Overweight (BMI 25.0-29.9)   Hyponatremia  Past Medical History  Diagnosis Date  . Osteoarthritis   . GERD (gastroesophageal reflux disease)   . HLD (hyperlipidemia)   . HTN (hypertension)   . Gastric ulcer   . Erosive esophagitis   . Osteopenia   . Gastritis   . Prediabetes     pt does not check cbg at home    HPI: [name], 79 y.o. female, has a history of pain and functional disability in the left knee(s) due to persistant pain and patient has failed non-surgical conservative treatments for greater than 12 weeks to include NSAID's and/or analgesics, supervised PT with diminished ADL's post treatment, use of assistive devices and activity modification. The indications for the revision of the total knee arthroplasty are persistant pain. Onset of symptoms was gradual starting 2 years ago with gradually worsening course since that time. Prior procedures on the left knee(s) include arthroplasty. Patient currently rates pain in the left knee(s) at 8 out of 10 with activity. There is worsening of pain with activity and weight bearing, pain that interferes with activities of daily living, pain with passive range of motion and when trying to stand after prolonged sitting. This condition presents safety issues increasing the risk of falls. There is no current active signs of infection. At this point, based on discussions between Dr. Charlann Boxer and the patient and patient's family she wishes to proceed with a left total knee revision. Dr. Charlann Boxer  will plan to revise the entire knee. At this point, Dr. Charlann Boxer is uncertain of the source of her pain and this has been discussed for the years that we have been following her. In comparison, she had a right total knee replacement that she has done very well with which makes this even more of a confounding variable. She wishes to proceed in this fashion understanding the risks that there could be persistent pain, risk of infection and the postoperative course and expectations with her knee.  PCP: Kerby Nora, MD   Discharged Condition: good  Hospital Course:  Patient underwent the above stated procedure on 05/30/2013. Patient tolerated the procedure well and brought to the recovery room in good condition and subsequently to the floor.  POD #1 BP: 116/66 ; Pulse: 72 ; Temp: 97.8 F (36.6 C) ; Resp: 16 Pt's foley was removed, as well as the hemovac drain removed. IV was changed to a saline lock. Patient reports pain as mild, pain well controlled. No events throughout the night. Says that she is ready to start PT and see how the knee feels. Neurovascular intact, dorsiflexion/plantar flexion intact, incision: dressing C/D/I, no cellulitis present and compartment soft.   LABS  Basename    HGB  10.7  HCT  31.9   POD #2  BP: 146/57 ; Pulse: 56 ; Temp: 97.8 F (36.6 C) ; Resp: 18 Patient reports pain as mild, pain controlled well. Feels like she has been doing well with PT. No events throughout the night. Neurovascular intact, dorsiflexion/plantar flexion intact, incision: dressing C/D/I, no cellulitis  present and compartment soft.   LABS  Basename    HGB  11.2  HCT  33.4   POD #3  BP: 152/58 ; Pulse: 59 ; Temp: 97.4 F (36.3 C) ; Resp: 17  Patient reports pain as mild, pain well controlled. No events throughout the night. Ready to be discharged to SNF. Neurovascular intact, dorsiflexion/plantar flexion intact, incision: dressing C/D/I, no cellulitis present and compartment soft.   LABS   No  new labs   Discharge Exam: General appearance: alert, cooperative and no distress Extremities: Homans sign is negative, no sign of DVT, no edema, redness or tenderness in the calves or thighs and no ulcers, gangrene or trophic changes  Disposition:    Skilled Nursing Facility with follow up in 2 weeks   Follow-up Information   Follow up with Shelda Pal, MD. Schedule an appointment as soon as possible for a visit in 2 weeks.   Specialty:  Orthopedic Surgery   Contact information:   [address] [place] [address] [phone]       Discharge Orders   Future Appointments Provider Department Dept Phone   08/02/2013 11:00 AM [facility] - [phone] [place] [phone]   08/02/2013 11:30 AM [facility]  IMAGING [phone]   Patient should wear two piece clothing and wear no powder or deodorant. Patient should arrive 15 minutes early.   11/04/2013 8:30 AM [facility] [phone]   11/11/2013 9:45 AM Amy Michelle Nasuti, MD  HealthCare at [facility] [phone]   Future Orders Complete By Expires   Call MD / Call 911  As directed    Comments:     If you experience chest pain or shortness of breath, CALL 911 and be transported to the hospital emergency room.  If you develope a fever above 101 F, pus (white drainage) or increased drainage or redness at the wound, or calf pain, call your surgeon's office.   Change dressing  As directed    Comments:     Maintain surgical dressing for 10-14 days, then change the dressing daily with sterile 4 x 4 inch gauze dressing and tape. Keep the area dry and clean.   Constipation Prevention  As directed    Comments:     Drink plenty of fluids.  Prune juice may be helpful.  You may use a stool softener, such as Colace (over the counter) 100 mg twice a day.  Use MiraLax (over the counter) for constipation as needed.   Diet - low sodium  heart healthy  As directed    Discharge instructions  As directed    Comments:     Maintain surgical dressing for 10-14 days, then replace with gauze and tape. Keep the area dry and clean until follow up. Follow up in 2 weeks at [facility]. Call with any questions or concerns.   Increase activity slowly as tolerated  As directed    TED hose  As directed    Comments:     Use stockings (TED hose) for 2 weeks on both leg(s).  You may remove them at night for sleeping.   Weight bearing as tolerated  As directed         Medication List    STOP taking these medications       acetaminophen 325 MG tablet  Commonly known as:  TYLENOL     aspirin 81 MG tablet  Replaced by:  aspirin  325 MG EC tablet     HYDROcodone-acetaminophen 5-325 MG per tablet  Commonly known as:  NORCO/VICODIN  Replaced by:  HYDROcodone-acetaminophen 7.5-325 MG per tablet      TAKE these medications       aspirin 325 MG EC tablet  Take 1 tablet (325 mg total) by mouth 2 (two) times daily.     CALTRATE 600+D 600-400 MG-UNIT per tablet  Generic drug:  Calcium Carbonate-Vitamin D  Take 1 tablet by mouth 2 (two) times daily.     dicyclomine 10 MG capsule  Commonly known as:  BENTYL  Take 10 mg by mouth 3 (three) times daily as needed (abdomen pain).     DSS 100 MG Caps  Take 100 mg by mouth 2 (two) times daily.     ferrous sulfate 325 (65 FE) MG tablet  Take 1 tablet (325 mg total) by mouth 3 (three) times daily after meals.     fish oil-omega-3 fatty acids 1000 MG capsule  Take 2 g by mouth 2 (two) times daily.     Glucosamine Sulfate 500 MG Caps  Take 500 mg by mouth 2 (two) times daily.     HYDROcodone-acetaminophen 7.5-325 MG per tablet  Commonly known as:  NORCO  Take 1-2 tablets by mouth every 4 (four) hours as needed for pain.     hydroxypropyl methylcellulose 2.5 % ophthalmic solution  Commonly known as:  ISOPTO TEARS  Place 1 drop into both eyes 3 (three) times daily as needed  (dry eyes).     lisinopril-hydrochlorothiazide 20-25 MG per tablet  Commonly known as:  PRINZIDE,ZESTORETIC  Take 1 tablet by mouth every morning.     metoprolol 50 MG tablet  Commonly known as:  LOPRESSOR  Take 50 mg by mouth 2 (two) times daily. Pt takes a total of 75mg      metoprolol tartrate 25 MG tablet  Commonly known as:  LOPRESSOR  Take 25 mg by mouth 2 (two) times daily. Pt takes a total of 75mg      multivitamin tablet  Take 1 tablet by mouth daily.     omeprazole 40 MG capsule  Commonly known as:  PRILOSEC  Take 40 mg by mouth daily.     OVER THE COUNTER MEDICATION  I caps 1 tab bid     polyethylene glycol packet  Commonly known as:  MIRALAX / GLYCOLAX  Take 17 g by mouth 2 (two) times daily.     tiZANidine 4 MG capsule  Commonly known as:  ZANAFLEX  Take 1 capsule (4 mg total) by mouth 3 (three) times daily as needed for muscle spasms.         Signed: Anastasio Auerbach. [place]   PAC  06/02/2013, 9:44 AM

## 2013-06-02 NOTE — Progress Notes (Signed)
Clinical Social Work Department CLINICAL SOCIAL WORK PLACEMENT NOTE 06/02/2013  Patient:  [name], [name]  Account Number:  [id number] Admit date:  05/30/2013  Clinical Social Worker:  Cori Razor, LCSW  Date/time:  05/31/2013 01:11 PM  Clinical Social Work is seeking post-discharge placement for this patient at the following level of care:   SKILLED NURSING   (*CSW will update this form in Epic as items are completed)     Patient/family provided with [facility] Department of Clinical Social Work's list of facilities offering this level of care within the geographic area requested by the patient (or if unable, by the patient's family).  05/31/2013  Patient/family informed of their freedom to choose among providers that offer the needed level of care, that participate in Medicare, Medicaid or managed care program needed by the patient, have an available bed and are willing to accept the patient.    Patient/family informed of [facility]' ownership interest in [facility], as well as of the fact that they are under no obligation to receive care at this facility.  PASARR submitted to EDS on  PASARR number received from [employer] on 10/02/2005  FL2 transmitted to all facilities in geographic area requested by pt/family on  05/31/2013 FL2 transmitted to all facilities within larger geographic area on   Patient informed that his/her managed care company has contracts with or will negotiate with  certain facilities, including the following:     Patient/family informed of bed offers received:  05/31/2013 Patient chooses bed at [facility] Physician recommends and patient chooses bed at    Patient to be transferred to [facility] on  06/02/2013 Patient to be transferred to facility by [facility]  The following physician request were entered in Epic:   Additional Comments:  Cori Razor LCSW [phone]

## 2013-06-02 NOTE — Care Management Note (Signed)
    Page 1 of 1   06/02/2013     1:32:20 PM   CARE MANAGEMENT NOTE 06/02/2013  Patient:  [name], [name]   Account Number:  [id number]  Date Initiated:  06/02/2013  Documentation initiated by:  Colleen Can  Subjective/Objective Assessment:   dx Revision left total knee arthroplasty     Action/Plan:   SNF rehab   Anticipated DC Date:  06/02/2013   Anticipated DC Plan:  SKILLED NURSING FACILITY  In-house referral  Clinical Social Worker      DC Planning Services  CM consult      Choice offered to / List presented to:             Status of service:  Completed, signed off Medicare Important Message given?  NA - LOS <3 / Initial given by admissions (If response is "NO", the following Medicare IM given date fields will be blank) Date Medicare IM given:   Date Additional Medicare IM given:    Discharge Disposition:  SKILLED NURSING FACILITY  Per UR Regulation:    If discussed at Long Length of Stay Meetings, dates discussed:    Comments:

## 2013-06-03 ENCOUNTER — Non-Acute Institutional Stay (SKILLED_NURSING_FACILITY): Payer: Medicare Other | Admitting: Adult Health

## 2013-06-03 DIAGNOSIS — I10 Essential (primary) hypertension: Secondary | ICD-10-CM | POA: Diagnosis not present

## 2013-06-03 DIAGNOSIS — D500 Iron deficiency anemia secondary to blood loss (chronic): Secondary | ICD-10-CM

## 2013-06-03 DIAGNOSIS — K5900 Constipation, unspecified: Secondary | ICD-10-CM

## 2013-06-03 DIAGNOSIS — E785 Hyperlipidemia, unspecified: Secondary | ICD-10-CM

## 2013-06-03 DIAGNOSIS — M1990 Unspecified osteoarthritis, unspecified site: Secondary | ICD-10-CM

## 2013-06-03 DIAGNOSIS — K219 Gastro-esophageal reflux disease without esophagitis: Secondary | ICD-10-CM

## 2013-06-06 ENCOUNTER — Non-Acute Institutional Stay (SKILLED_NURSING_FACILITY): Payer: Medicare Other | Admitting: Internal Medicine

## 2013-06-06 ENCOUNTER — Encounter: Payer: Self-pay | Admitting: Adult Health

## 2013-06-06 DIAGNOSIS — E785 Hyperlipidemia, unspecified: Secondary | ICD-10-CM

## 2013-06-06 DIAGNOSIS — IMO0002 Reserved for concepts with insufficient information to code with codable children: Secondary | ICD-10-CM | POA: Diagnosis not present

## 2013-06-06 DIAGNOSIS — K5900 Constipation, unspecified: Secondary | ICD-10-CM | POA: Diagnosis not present

## 2013-06-06 DIAGNOSIS — I10 Essential (primary) hypertension: Secondary | ICD-10-CM | POA: Diagnosis not present

## 2013-06-06 DIAGNOSIS — K219 Gastro-esophageal reflux disease without esophagitis: Secondary | ICD-10-CM

## 2013-06-06 DIAGNOSIS — M1712 Unilateral primary osteoarthritis, left knee: Secondary | ICD-10-CM

## 2013-06-06 DIAGNOSIS — M1710 Unilateral primary osteoarthritis, unspecified knee: Secondary | ICD-10-CM | POA: Diagnosis not present

## 2013-06-06 MED ORDER — SENNOSIDES-DOCUSATE SODIUM 8.6-50 MG PO TABS: 2.0000 | ORAL_TABLET | Freq: Two times a day (BID) | ORAL | Status: DC

## 2013-06-06 MED ORDER — FERROUS SULFATE 325 (65 FE) MG PO TABS: 325.0000 mg | ORAL_TABLET | Freq: Every day | ORAL | Status: DC

## 2013-06-06 NOTE — Assessment & Plan Note (Signed)
Will continue her prilosec 40 mg daly and bentyl 10 mg three times daily as needed and will monitor

## 2013-06-06 NOTE — Assessment & Plan Note (Signed)
Will continue fish oil 2 gm twice daily

## 2013-06-06 NOTE — Assessment & Plan Note (Signed)
Her status is worse;will continue her miralax twice daily; will stop her colace and will start senna s 2 tabs twice daily; will give her a dulcolax supp one time now and will monitor her status

## 2013-06-06 NOTE — Assessment & Plan Note (Signed)
Is status post knee replacement; will continue her glucosamine 500 mg twice daily; will continue her vicodin 75/325 mg 1-2 tabs every 4 hours as needed for pain and zanaflex 4 gm three times dialy as needed for spasms; will continue therapy as directed and will monitor her status

## 2013-06-06 NOTE — Assessment & Plan Note (Signed)
Her hgb is stable will lower her iron to one tab daily as she having a great deal of difficulty wiht constipation and due to her advanced age more than likely cannot absorb all the iron at any rate.

## 2013-06-06 NOTE — Progress Notes (Signed)
Patient ID: [name], female   DOB: 09-Feb-1925, 79 y.o.   MRN: [id number]  [facility]   PCP: Kerby Nora, MD  Code Status: full code  No Known Allergies  Chief Complaint: new admit  HPI:  79 y/o female patient with hx of DJD was admitted to the hospital and underwent left total knee arthroplasty. She is here for STR. She was seen in her room today. Her pain is under control and she has been working with therapy team. She has been having regular bowel movement. No concerns from staff.  Review of Systems:  Constitutional: Negative for malaise/fatigue.  Respiratory: Negative for cough and shortness of breath.   Cardiovascular: Negative for chest pain, palpitations and leg swelling.  Gastrointestinal: Negative for heartburn and abdominal pain.  Musculoskeletal: Negative for myalgias and joint pain.  Skin: Negative.   Neurological: Negative for dizziness and headaches.  Psychiatric/Behavioral: The patient is not nervous/anxious and does not have insomnia.     Past Medical History  Diagnosis Date  . Osteoarthritis   . GERD (gastroesophageal reflux disease)   . HLD (hyperlipidemia)   . HTN (hypertension)   . Gastric ulcer   . Erosive esophagitis   . Osteopenia   . Gastritis   . Prediabetes     pt does not check cbg at home   Past Surgical History  Procedure Laterality Date  . Left knee replacement  09-30-2005  . Right shoulder surgery  2005  . Nuclear cardiolyte  10/08  . Knee surgery Left 05/2009    to cut nerves  . Right knee replacement Right 08-10-2013  . Appendectomy  yrs ago  . Benign nodule removed from neck  05-21-2013  . Eye surgery Bilateral 9- 9-19-2011and 07-2010    cataract lens replacement  . Arthroscopic knee surgery Left yrs ago  . Total knee revision Left 05/30/2013    Procedure: LEFT TOTAL KNEE REVISION;  Surgeon: Shelda Pal, MD;  Location: [facility];  Service: Orthopedics;  Laterality: Left;   Social History:   reports that she has never  smoked. She has never used smokeless tobacco. She reports that she does not drink alcohol or use illicit drugs.  Family History  Problem Relation Age of Onset  . Coronary artery disease Father 6  . Hypertension Sister     x 4    Medications: Patient's Medications  New Prescriptions   SENNA-DOCUSATE (SENOKOT-S) 8.6-50 MG PER TABLET    Take 2 tablets by mouth 2 (two) times daily.  Previous Medications   ASPIRIN EC 325 MG EC TABLET    Take 1 tablet (325 mg total) by mouth 2 (two) times daily.   CALCIUM CARBONATE-VITAMIN D (CALTRATE 600+D) 600-400 MG-UNIT PER TABLET    Take 1 tablet by mouth 2 (two) times daily.    DICYCLOMINE (BENTYL) 10 MG CAPSULE    Take 10 mg by mouth 3 (three) times daily as needed (abdomen pain).   FISH OIL-OMEGA-3 FATTY ACIDS 1000 MG CAPSULE    Take 2 g by mouth 2 (two) times daily.    GLUCOSAMINE SULFATE 500 MG CAPS    Take 500 mg by mouth 2 (two) times daily.    HYDROCODONE-ACETAMINOPHEN (NORCO) 7.5-325 MG PER TABLET    Take 1-2 tablets by mouth every 4 (four) hours as needed for pain.   HYDROXYPROPYL METHYLCELLULOSE (ISOPTO TEARS) 2.5 % OPHTHALMIC SOLUTION    Place 1 drop into both eyes 3 (three) times daily as needed (dry eyes).   LISINOPRIL-HYDROCHLOROTHIAZIDE (PRINZIDE,ZESTORETIC)  20-25 MG PER TABLET    Take 1 tablet by mouth every morning.   METOPROLOL (LOPRESSOR) 50 MG TABLET    Take 50 mg by mouth 2 (two) times daily. Pt takes a total of 75mg    METOPROLOL TARTRATE (LOPRESSOR) 25 MG TABLET    Take 25 mg by mouth 2 (two) times daily. Pt takes a total of 75mg    MULTIPLE VITAMIN (MULTIVITAMIN) TABLET    Take 1 tablet by mouth daily.     OMEPRAZOLE (PRILOSEC) 40 MG CAPSULE    Take 40 mg by mouth daily.   OVER THE COUNTER MEDICATION    I caps 1 tab bid   POLYETHYLENE GLYCOL (MIRALAX / GLYCOLAX) PACKET    Take 17 g by mouth 2 (two) times daily.   TIZANIDINE (ZANAFLEX) 4 MG CAPSULE    Take 1 capsule (4 mg total) by mouth 3 (three) times daily as needed for muscle  spasms.  Modified Medications   Modified Medication Previous Medication   FERROUS SULFATE 325 (65 FE) MG TABLET ferrous sulfate 325 (65 FE) MG tablet      Take 1 tablet (325 mg total) by mouth daily with breakfast.    Take 1 tablet (325 mg total) by mouth 3 (three) times daily after meals.  Discontinued Medications   DOCUSATE SODIUM 100 MG CAPS    Take 100 mg by mouth 2 (two) times daily.     Physical Exam: Filed Vitals:   06/06/13 1700  BP: 107/60  Pulse: 78  Temp: 97.5 F (36.4 C)  Resp: 18  SpO2: 96%   Constitutional: She is oriented to person, place, and time. She appears well-developed and well-nourished.  Neck: Neck supple. No JVD present. No thyromegaly present.  Cardiovascular: Normal rate, regular rhythm and intact distal pulses.   Respiratory: Effort normal and breath sounds normal. No respiratory distress. She has no wheezes.  GI: Soft. Bowel sounds are normal. She exhibits no distension. There is no tenderness.  Musculoskeletal: Normal range of motion. She exhibits no edema.  Has limited range of motion in left knee secondary to recent surgery.   Neurological: She is alert and oriented to person, place, and time.  Skin: Skin is warm and dry. glue has been applied at incision site and healing well Psychiatric: She has a normal mood and affect.    Labs reviewed: Basic Metabolic Panel:  Recent Labs  40/98/11 1350 05/31/13 0410 06/01/13 0405  NA 134* 131* 133*  K 4.9 4.4 4.3  CL 99 101 100  CO2 27 22 24   GLUCOSE 101* 166* 125*  BUN 30* 34* 42*  CREATININE 1.04 1.21* 1.41*  CALCIUM 10.0 8.8 9.1   Liver Function Tests:  Recent Labs  10/28/12 0942 05/03/13 0849  AST 19 17  ALT 14 14  ALKPHOS 46 46  BILITOT 0.7 0.4  PROT 7.4 6.9  ALBUMIN 3.7 3.5   CBC:  Recent Labs  05/24/13 1350 05/31/13 0410 06/01/13 0405  WBC 7.3 14.5* 13.3*  HGB 13.1 10.7* 11.2*  HCT 38.8 31.9* 33.4*  MCV 90.9 89.9 89.8  PLT 213 181 187     Assessment/Plan  OSTEOARTHRITIS Is status post knee arthroplasty. Continue her pain regimen of norco. To continue working with PT/OT for gait training. Continue using ted hose. WBAT. Continue aspirin for dvt prophylaxis. Continue zanaflex for muscle spasm. Continue ca-vit d. Fall precautions and skin care. To follow with orhtopedics  HYPERTENSION, BENIGN On lisinopril hct 20/25 mg daily and lopressor 75 mg twice daily. Continue  this and monitor BP    anemia Hemoglobin stable at discharge. Continue iron supplement  Constipation Has regular bowel movement. Continue current regimen  GERD Symptoms currently under control on prilosec 40 mg daly and bentyl 10 mg three times daily as needed.  HYPERLIPIDEMIA Will continue fish oil 2 gm twice daily   Family/ staff Communication: reviewed care plan with patient, family member and nursing staff   Labs/tests ordered- cbc, bmp

## 2013-06-06 NOTE — Progress Notes (Signed)
Patient ID: [name], female   DOB: Apr 22, 1925, 79 y.o.   MRN: [id number]  [facility]  No Known Allergies   Chief Complaint  Patient presents with  . Hospitalization Follow-up    HPI: She has been hospitalized for a left knee revision. She is here for short term rehab her goal is to return back home. She is not having any concerns except for constipation at this time.   Past Medical History  Diagnosis Date  . Osteoarthritis   . GERD (gastroesophageal reflux disease)   . HLD (hyperlipidemia)   . HTN (hypertension)   . Gastric ulcer   . Erosive esophagitis   . Osteopenia   . Gastritis   . Prediabetes     pt does not check cbg at home    Past Surgical History  Procedure Laterality Date  . Left knee replacement  09-30-2005  . Right shoulder surgery  2005  . Nuclear cardiolyte  10/08  . Knee surgery Left 05/2009    to cut nerves  . Right knee replacement Right 08-10-2013  . Appendectomy  yrs ago  . Benign nodule removed from neck  05-21-2013  . Eye surgery Bilateral 9- 9-19-2011and 07-2010    cataract lens replacement  . Arthroscopic knee surgery Left yrs ago  . Total knee revision Left 05/30/2013    Procedure: LEFT TOTAL KNEE REVISION;  Surgeon: Shelda Pal, MD;  Location: [facility];  Service: Orthopedics;  Laterality: Left;    VITAL SIGNS BP 132/80  Pulse 78  Ht 5\' 4"  (1.626 m)  Wt 152 lb (68.947 kg)  BMI 26.08 kg/m2   Patient's Medications  New Prescriptions   No medications on file  Previous Medications   ASPIRIN EC 325 MG EC TABLET    Take 1 tablet (325 mg total) by mouth 2 (two) times daily.   CALCIUM CARBONATE-VITAMIN D (CALTRATE 600+D) 600-400 MG-UNIT PER TABLET    Take 1 tablet by mouth 2 (two) times daily.    DICYCLOMINE (BENTYL) 10 MG CAPSULE    Take 10 mg by mouth 3 (three) times daily as needed (abdomen pain).   DOCUSATE SODIUM 100 MG CAPS    Take 100 mg by mouth 2 (two) times daily.   FERROUS SULFATE 325 (65 FE) MG TABLET    Take 1 tablet (325  mg total) by mouth 3 (three) times daily after meals.   FISH OIL-OMEGA-3 FATTY ACIDS 1000 MG CAPSULE    Take 2 g by mouth 2 (two) times daily.    GLUCOSAMINE SULFATE 500 MG CAPS    Take 500 mg by mouth 2 (two) times daily.    HYDROCODONE-ACETAMINOPHEN (NORCO) 7.5-325 MG PER TABLET    Take 1-2 tablets by mouth every 4 (four) hours as needed for pain.   HYDROXYPROPYL METHYLCELLULOSE (ISOPTO TEARS) 2.5 % OPHTHALMIC SOLUTION    Place 1 drop into both eyes 3 (three) times daily as needed (dry eyes).   LISINOPRIL-HYDROCHLOROTHIAZIDE (PRINZIDE,ZESTORETIC) 20-25 MG PER TABLET    Take 1 tablet by mouth every morning.   METOPROLOL (LOPRESSOR) 50 MG TABLET    Take 50 mg by mouth 2 (two) times daily. Pt takes a total of 75mg    METOPROLOL TARTRATE (LOPRESSOR) 25 MG TABLET    Take 25 mg by mouth 2 (two) times daily. Pt takes a total of 75mg    MULTIPLE VITAMIN (MULTIVITAMIN) TABLET    Take 1 tablet by mouth daily.     OMEPRAZOLE (PRILOSEC) 40 MG CAPSULE    Take  40 mg by mouth daily.   OVER THE COUNTER MEDICATION    I caps 1 tab bid   POLYETHYLENE GLYCOL (MIRALAX / GLYCOLAX) PACKET    Take 17 g by mouth 2 (two) times daily.   TIZANIDINE (ZANAFLEX) 4 MG CAPSULE    Take 1 capsule (4 mg total) by mouth 3 (three) times daily as needed for muscle spasms.  Modified Medications   No medications on file  Discontinued Medications   No medications on file    SIGNIFICANT DIAGNOSTIC EXAMS  05-24-13: chest x-ray: Stable mild cardiomegaly. No active lung disease.  LABS REVIEWED:   8--13-14: wbc 13.3; hgb 11.2; hct 33.4 ;mcv 89.9 plt 187;glucose 125; bun 42; creat 1.41; k+4.3; Na++133  Review of Systems  Constitutional: Negative for malaise/fatigue.  Respiratory: Negative for cough and shortness of breath.   Cardiovascular: Negative for chest pain, palpitations and leg swelling.  Gastrointestinal: Positive for constipation. Negative for heartburn and abdominal pain.  Musculoskeletal: Negative for myalgias and joint  pain.  Skin: Negative.   Neurological: Negative for dizziness and headaches.  Psychiatric/Behavioral: The patient is not nervous/anxious and does not have insomnia.     Physical Exam  Constitutional: She is oriented to person, place, and time. She appears well-developed and well-nourished.  Neck: Neck supple. No JVD present. No thyromegaly present.  Cardiovascular: Normal rate, regular rhythm and intact distal pulses.   Respiratory: Effort normal and breath sounds normal. No respiratory distress. She has no wheezes.  GI: Soft. Bowel sounds are normal. She exhibits no distension. There is no tenderness.  Musculoskeletal: Normal range of motion. She exhibits no edema.  Has limited range of motion in left knee secondary to recent surgery.   Neurological: She is alert and oriented to person, place, and time.  Skin: Skin is warm and dry.  Left knee incision line without signs of infection or inflammation present.   Psychiatric: She has a normal mood and affect.   yshASSESSMENT/ PLAN:  HYPERTENSION, BENIGN Is stable will continue her lisinopril hct 20/25 mg daily; lopressor 75 mg twice daily  and will monitor her status   GERD Will continue her prilosec 40 mg daly and bentyl 10 mg three times daily as needed and will monitor   Expected blood loss anemia Her hgb is stable will lower her iron to one tab daily as she having a great deal of difficulty wiht constipation and due to her advanced age more than likely cannot absorb all the iron at any rate.   Constipation Her status is worse;will continue her miralax twice daily; will stop her colace and will start senna s 2 tabs twice daily; will give her a dulcolax supp one time now and will monitor her status   OSTEOARTHRITIS Is status post knee replacement; will continue her glucosamine 500 mg twice daily; will continue her vicodin 75/325 mg 1-2 tabs every 4 hours as needed for pain and zanaflex 4 gm three times dialy as needed for spasms;  will continue therapy as directed and will monitor her status   HYPERLIPIDEMIA Will continue fish oil 2 gm twice daily     Time spent with patient 50 minutes

## 2013-06-06 NOTE — Assessment & Plan Note (Signed)
Is stable will continue her lisinopril hct 20/25 mg daily; lopressor 75 mg twice daily  and will monitor her status

## 2013-06-16 ENCOUNTER — Non-Acute Institutional Stay (SKILLED_NURSING_FACILITY): Payer: Medicare Other | Admitting: Adult Health

## 2013-06-16 DIAGNOSIS — I10 Essential (primary) hypertension: Secondary | ICD-10-CM

## 2013-06-16 DIAGNOSIS — K219 Gastro-esophageal reflux disease without esophagitis: Secondary | ICD-10-CM

## 2013-06-16 DIAGNOSIS — M1990 Unspecified osteoarthritis, unspecified site: Secondary | ICD-10-CM | POA: Diagnosis not present

## 2013-06-20 DIAGNOSIS — Z96659 Presence of unspecified artificial knee joint: Secondary | ICD-10-CM | POA: Diagnosis not present

## 2013-06-20 DIAGNOSIS — I10 Essential (primary) hypertension: Secondary | ICD-10-CM | POA: Diagnosis not present

## 2013-06-20 DIAGNOSIS — D62 Acute posthemorrhagic anemia: Secondary | ICD-10-CM | POA: Diagnosis not present

## 2013-06-20 DIAGNOSIS — H543 Unqualified visual loss, both eyes: Secondary | ICD-10-CM | POA: Diagnosis not present

## 2013-06-20 DIAGNOSIS — Z4789 Encounter for other orthopedic aftercare: Secondary | ICD-10-CM | POA: Diagnosis not present

## 2013-06-22 DIAGNOSIS — H543 Unqualified visual loss, both eyes: Secondary | ICD-10-CM | POA: Diagnosis not present

## 2013-06-22 DIAGNOSIS — D62 Acute posthemorrhagic anemia: Secondary | ICD-10-CM | POA: Diagnosis not present

## 2013-06-22 DIAGNOSIS — I10 Essential (primary) hypertension: Secondary | ICD-10-CM | POA: Diagnosis not present

## 2013-06-22 DIAGNOSIS — Z4789 Encounter for other orthopedic aftercare: Secondary | ICD-10-CM | POA: Diagnosis not present

## 2013-06-22 DIAGNOSIS — Z96659 Presence of unspecified artificial knee joint: Secondary | ICD-10-CM | POA: Diagnosis not present

## 2013-06-23 DIAGNOSIS — I10 Essential (primary) hypertension: Secondary | ICD-10-CM | POA: Diagnosis not present

## 2013-06-23 DIAGNOSIS — H543 Unqualified visual loss, both eyes: Secondary | ICD-10-CM | POA: Diagnosis not present

## 2013-06-23 DIAGNOSIS — D62 Acute posthemorrhagic anemia: Secondary | ICD-10-CM | POA: Diagnosis not present

## 2013-06-23 DIAGNOSIS — Z96659 Presence of unspecified artificial knee joint: Secondary | ICD-10-CM | POA: Diagnosis not present

## 2013-06-23 DIAGNOSIS — Z4789 Encounter for other orthopedic aftercare: Secondary | ICD-10-CM | POA: Diagnosis not present

## 2013-06-27 DIAGNOSIS — D62 Acute posthemorrhagic anemia: Secondary | ICD-10-CM | POA: Diagnosis not present

## 2013-06-27 DIAGNOSIS — Z4789 Encounter for other orthopedic aftercare: Secondary | ICD-10-CM | POA: Diagnosis not present

## 2013-06-27 DIAGNOSIS — I10 Essential (primary) hypertension: Secondary | ICD-10-CM | POA: Diagnosis not present

## 2013-06-27 DIAGNOSIS — Z96659 Presence of unspecified artificial knee joint: Secondary | ICD-10-CM | POA: Diagnosis not present

## 2013-06-27 DIAGNOSIS — H543 Unqualified visual loss, both eyes: Secondary | ICD-10-CM | POA: Diagnosis not present

## 2013-06-28 DIAGNOSIS — H31019 Macula scars of posterior pole (postinflammatory) (post-traumatic), unspecified eye: Secondary | ICD-10-CM | POA: Diagnosis not present

## 2013-06-28 DIAGNOSIS — H353290 Exudative age-related macular degeneration, unspecified eye, stage unspecified: Secondary | ICD-10-CM | POA: Diagnosis not present

## 2013-06-28 DIAGNOSIS — H353190 Nonexudative age-related macular degeneration, unspecified eye, stage unspecified: Secondary | ICD-10-CM | POA: Diagnosis not present

## 2013-06-28 DIAGNOSIS — H43819 Vitreous degeneration, unspecified eye: Secondary | ICD-10-CM | POA: Diagnosis not present

## 2013-06-29 DIAGNOSIS — Z96659 Presence of unspecified artificial knee joint: Secondary | ICD-10-CM | POA: Diagnosis not present

## 2013-06-29 DIAGNOSIS — I10 Essential (primary) hypertension: Secondary | ICD-10-CM | POA: Diagnosis not present

## 2013-06-29 DIAGNOSIS — D62 Acute posthemorrhagic anemia: Secondary | ICD-10-CM | POA: Diagnosis not present

## 2013-06-29 DIAGNOSIS — Z4789 Encounter for other orthopedic aftercare: Secondary | ICD-10-CM | POA: Diagnosis not present

## 2013-06-29 DIAGNOSIS — H543 Unqualified visual loss, both eyes: Secondary | ICD-10-CM | POA: Diagnosis not present

## 2013-06-30 DIAGNOSIS — Z96659 Presence of unspecified artificial knee joint: Secondary | ICD-10-CM | POA: Diagnosis not present

## 2013-06-30 DIAGNOSIS — H543 Unqualified visual loss, both eyes: Secondary | ICD-10-CM | POA: Diagnosis not present

## 2013-06-30 DIAGNOSIS — Z4789 Encounter for other orthopedic aftercare: Secondary | ICD-10-CM | POA: Diagnosis not present

## 2013-06-30 DIAGNOSIS — I10 Essential (primary) hypertension: Secondary | ICD-10-CM | POA: Diagnosis not present

## 2013-06-30 DIAGNOSIS — D62 Acute posthemorrhagic anemia: Secondary | ICD-10-CM | POA: Diagnosis not present

## 2013-07-01 DIAGNOSIS — Z4789 Encounter for other orthopedic aftercare: Secondary | ICD-10-CM | POA: Diagnosis not present

## 2013-07-01 DIAGNOSIS — D62 Acute posthemorrhagic anemia: Secondary | ICD-10-CM | POA: Diagnosis not present

## 2013-07-01 DIAGNOSIS — Z96659 Presence of unspecified artificial knee joint: Secondary | ICD-10-CM | POA: Diagnosis not present

## 2013-07-01 DIAGNOSIS — H543 Unqualified visual loss, both eyes: Secondary | ICD-10-CM | POA: Diagnosis not present

## 2013-07-01 DIAGNOSIS — I10 Essential (primary) hypertension: Secondary | ICD-10-CM | POA: Diagnosis not present

## 2013-07-04 DIAGNOSIS — M25569 Pain in unspecified knee: Secondary | ICD-10-CM | POA: Diagnosis not present

## 2013-07-05 NOTE — Progress Notes (Signed)
Patient ID: [name], female   DOB: [birth year], 79 y.o.   MRN: [id number]  [facility]  No Known Allergies  Chief Complaint  Patient presents with  . Discharge Note    HPI She had been hospitalized for left knee revision; she was then admitted to this facility for short term rehab and is now ready for discharge back home. She will need home health for pt/ot/nursing care will not need any dme; will need her prescriptions written.   Past Medical History  Diagnosis Date  . Osteoarthritis   . GERD (gastroesophageal reflux disease)   . HLD (hyperlipidemia)   . HTN (hypertension)   . Gastric ulcer   . Erosive esophagitis   . Osteopenia   . Gastritis   . Prediabetes     pt does not check cbg at home    Past Surgical History  Procedure Laterality Date  . Left knee replacement  09-30-2005  . Right shoulder surgery  2005  . Nuclear cardiolyte  10/08  . Knee surgery Left 05/2009    to cut nerves  . Right knee replacement Right 08-10-2013  . Appendectomy  yrs ago  . Benign nodule removed from neck  05-21-2013  . Eye surgery Bilateral 9- 9-19-2011and 07-2010    cataract lens replacement  . Arthroscopic knee surgery Left yrs ago  . Total knee revision Left 05/30/2013    Procedure: LEFT TOTAL KNEE REVISION;  Surgeon: Shelda Pal, MD;  Location: [facility];  Service: Orthopedics;  Laterality: Left;    Current Outpatient Prescriptions on File Prior to Visit  Medication Sig Dispense Refill  . Calcium Carbonate-Vitamin D (CALTRATE 600+D) 600-400 MG-UNIT per tablet Take 1 tablet by mouth 2 (two) times daily.       Marland Kitchen dicyclomine (BENTYL) 10 MG capsule Take 10 mg by mouth 3 (three) times daily as needed (abdomen pain).      . ferrous sulfate 325 (65 FE) MG tablet Take 1 tablet (325 mg total) by mouth daily with breakfast.    3  . fish oil-omega-3 fatty acids 1000 MG capsule Take 2 g by mouth 2 (two) times daily.       . Glucosamine Sulfate 500 MG CAPS Take 500 mg by mouth 2 (two) times  daily.       Marland Kitchen HYDROcodone-acetaminophen (NORCO) 7.5-325 MG per tablet Take 1-2 tablets by mouth every 4 (four) hours as needed for pain.  240 tablet  5  . hydroxypropyl methylcellulose (ISOPTO TEARS) 2.5 % ophthalmic solution Place 1 drop into both eyes 3 (three) times daily as needed (dry eyes).      Marland Kitchen lisinopril-hydrochlorothiazide (PRINZIDE,ZESTORETIC) 20-25 MG per tablet Take 1 tablet by mouth every morning.      . metoprolol (LOPRESSOR) 50 MG tablet Take 50 mg by mouth 2 (two) times daily. Pt takes a total of 75mg       . metoprolol tartrate (LOPRESSOR) 25 MG tablet Take 25 mg by mouth 2 (two) times daily. Pt takes a total of 75mg       . Multiple Vitamin (MULTIVITAMIN) tablet Take 1 tablet by mouth daily.        Marland Kitchen omeprazole (PRILOSEC) 40 MG capsule Take 40 mg by mouth daily.      Marland Kitchen OVER THE COUNTER MEDICATION I caps 1 tab bid      . polyethylene glycol (MIRALAX / GLYCOLAX) packet Take 17 g by mouth 2 (two) times daily.  14 each  0  . senna-docusate (SENOKOT-S) 8.6-50 MG per  tablet Take 2 tablets by mouth 2 (two) times daily.  60 tablet  99  . tiZANidine (ZANAFLEX) 4 MG capsule Take 1 capsule (4 mg total) by mouth 3 (three) times daily as needed for muscle spasms.  50 capsule  0   No current facility-administered medications on file prior to visit.    Filed Vitals:   06/16/13 1146  BP: 127/65  Pulse: 70  Height: 5\' 4"  (1.626 m)  Weight: 152 lb (68.947 kg)     SIGNIFICANT DIAGNOSTIC EXAMS  05-24-13: chest x-ray: Stable mild cardiomegaly. No active lung disease.  LABS REVIEWED:   8--13-14: wbc 13.3; hgb 11.2; hct 33.4 ;mcv 89.9 plt 187;glucose 125; bun 42; creat 1.41; k+4.3; Na++133  Review of Systems  Constitutional: Negative for malaise/fatigue.  Respiratory: Negative for cough and shortness of breath.   Cardiovascular: Negative for chest pain, palpitations and leg swelling.  Gastrointestinal:. Negative for heartburn and abdominal pain.  Musculoskeletal: Negative for  myalgias and joint pain.  Skin: Negative.   Neurological: Negative for dizziness and headaches.  Psychiatric/Behavioral: The patient is not nervous/anxious and does not have insomnia.     Physical Exam  Constitutional: She is oriented to person, place, and time. She appears well-developed and well-nourished.  Neck: Neck supple. No JVD present. No thyromegaly present.  Cardiovascular: Normal rate, regular rhythm and intact distal pulses.   Respiratory: Effort normal and breath sounds normal. No respiratory distress. She has no wheezes.  GI: Soft. Bowel sounds are normal. She exhibits no distension. There is no tenderness.  Musculoskeletal: Normal range of motion. She exhibits no edema.  Has limited range of motion in left knee secondary to recent surgery.   Neurological: She is alert and oriented to person, place, and time.  Skin: Skin is warm and dry.  Left knee incision line without signs of infection or inflammation present.   Psychiatric: She has a normal mood and affect.    ASSESSMENT/PLAN  Will discharge her to home with home health for pt/ot/nursing. She will not need any dme; her prescriptions have been written.   Time spent with patient 35 minutes.

## 2013-07-06 DIAGNOSIS — M25569 Pain in unspecified knee: Secondary | ICD-10-CM | POA: Diagnosis not present

## 2013-07-12 DIAGNOSIS — M25569 Pain in unspecified knee: Secondary | ICD-10-CM | POA: Diagnosis not present

## 2013-07-15 DIAGNOSIS — M25569 Pain in unspecified knee: Secondary | ICD-10-CM | POA: Diagnosis not present

## 2013-07-18 DIAGNOSIS — M25569 Pain in unspecified knee: Secondary | ICD-10-CM | POA: Diagnosis not present

## 2013-07-20 DIAGNOSIS — M25569 Pain in unspecified knee: Secondary | ICD-10-CM | POA: Diagnosis not present

## 2013-07-22 DIAGNOSIS — Z96659 Presence of unspecified artificial knee joint: Secondary | ICD-10-CM | POA: Diagnosis not present

## 2013-07-26 DIAGNOSIS — M25569 Pain in unspecified knee: Secondary | ICD-10-CM | POA: Diagnosis not present

## 2013-07-28 DIAGNOSIS — M25569 Pain in unspecified knee: Secondary | ICD-10-CM | POA: Diagnosis not present

## 2013-08-02 ENCOUNTER — Ambulatory Visit
Admission: RE | Admit: 2013-08-02 | Discharge: 2013-08-02 | Disposition: A | Discharge status: Home / Self Care | Payer: Medicare Other | Source: Ambulatory Visit | Attending: Family Medicine | Admitting: Family Medicine

## 2013-08-02 DIAGNOSIS — Z1231 Encounter for screening mammogram for malignant neoplasm of breast: Secondary | ICD-10-CM

## 2013-08-02 DIAGNOSIS — M899 Disorder of bone, unspecified: Secondary | ICD-10-CM | POA: Diagnosis not present

## 2013-08-10 HISTORY — PX: OTHER SURGICAL HISTORY: SHX169

## 2013-08-18 DIAGNOSIS — Z23 Encounter for immunization: Secondary | ICD-10-CM | POA: Diagnosis not present

## 2013-08-31 ENCOUNTER — Other Ambulatory Visit: Payer: Self-pay

## 2013-08-31 MED ORDER — LISINOPRIL-HYDROCHLOROTHIAZIDE 20-25 MG PO TABS: 1.0000 | ORAL_TABLET | Freq: Every morning | ORAL | Status: DC

## 2013-08-31 MED ORDER — METOPROLOL TARTRATE 50 MG PO TABS: 50.0000 mg | ORAL_TABLET | Freq: Two times a day (BID) | ORAL | Status: DC

## 2013-08-31 MED ORDER — OMEPRAZOLE 40 MG PO CPDR: 40.0000 mg | DELAYED_RELEASE_CAPSULE | Freq: Every day | ORAL | Status: DC

## 2013-08-31 MED ORDER — METOPROLOL TARTRATE 25 MG PO TABS: 25.0000 mg | ORAL_TABLET | Freq: Two times a day (BID) | ORAL | Status: DC

## 2013-08-31 NOTE — Telephone Encounter (Signed)
Pt left note requesting refill metoprolol 25 and 50 mg,lisinopril-HCTZ and omeprazole. Refill done and pt notified by telephone v/m.

## 2013-09-01 ENCOUNTER — Other Ambulatory Visit: Payer: Self-pay

## 2013-09-01 MED ORDER — METOPROLOL TARTRATE 50 MG PO TABS: 50.0000 mg | ORAL_TABLET | Freq: Two times a day (BID) | ORAL | Status: DC

## 2013-09-01 MED ORDER — METOPROLOL TARTRATE 25 MG PO TABS: 25.0000 mg | ORAL_TABLET | Freq: Two times a day (BID) | ORAL | Status: DC

## 2013-09-01 MED ORDER — OMEPRAZOLE 40 MG PO CPDR: 40.0000 mg | DELAYED_RELEASE_CAPSULE | Freq: Every day | ORAL | Status: DC

## 2013-09-01 MED ORDER — LISINOPRIL-HYDROCHLOROTHIAZIDE 20-25 MG PO TABS: 1.0000 | ORAL_TABLET | Freq: Every morning | ORAL | Status: DC

## 2013-09-01 NOTE — Telephone Encounter (Signed)
Refills were sent to [facility] and pt wanted med sent to [facility].Apologized and Advised pts daughter that refills have been sent to [facility] and [facility] refills cancelled.

## 2013-10-24 ENCOUNTER — Other Ambulatory Visit: Payer: Self-pay | Admitting: *Deleted

## 2013-10-24 MED ORDER — METOPROLOL TARTRATE 25 MG PO TABS: 25.0000 mg | ORAL_TABLET | Freq: Two times a day (BID) | ORAL | Status: DC

## 2013-10-24 MED ORDER — LISINOPRIL-HYDROCHLOROTHIAZIDE 20-25 MG PO TABS: 1.0000 | ORAL_TABLET | Freq: Every morning | ORAL | Status: DC

## 2013-10-24 MED ORDER — METOPROLOL TARTRATE 50 MG PO TABS: 50.0000 mg | ORAL_TABLET | Freq: Two times a day (BID) | ORAL | Status: DC

## 2013-10-24 MED ORDER — OMEPRAZOLE 40 MG PO CPDR: 40.0000 mg | DELAYED_RELEASE_CAPSULE | Freq: Every day | ORAL | Status: DC

## 2013-11-04 ENCOUNTER — Telehealth: Payer: Self-pay | Admitting: Family Medicine

## 2013-11-04 ENCOUNTER — Other Ambulatory Visit (INDEPENDENT_AMBULATORY_CARE_PROVIDER_SITE_OTHER): Payer: Medicare Other

## 2013-11-04 DIAGNOSIS — E785 Hyperlipidemia, unspecified: Secondary | ICD-10-CM

## 2013-11-04 DIAGNOSIS — M899 Disorder of bone, unspecified: Secondary | ICD-10-CM

## 2013-11-04 DIAGNOSIS — M949 Disorder of cartilage, unspecified: Secondary | ICD-10-CM

## 2013-11-04 LAB — COMPREHENSIVE METABOLIC PANEL
ALT: 17 U/L (ref 0–35)
AST: 18 U/L (ref 0–37)
Albumin: 3.7 g/dL (ref 3.5–5.2)
Alkaline Phosphatase: 59 U/L (ref 39–117)
BUN: 27 mg/dL — ABNORMAL HIGH (ref 6–23)
CO2: 27 mEq/L (ref 19–32)
Calcium: 9.5 mg/dL (ref 8.4–10.5)
Chloride: 106 mEq/L (ref 96–112)
Creatinine, Ser: 1 mg/dL (ref 0.4–1.2)
GFR: 59.27 mL/min — ABNORMAL LOW (ref >60.00)
Glucose, Bld: 101 mg/dL — ABNORMAL HIGH (ref 70–99)
Potassium: 3.8 mEq/L (ref 3.5–5.1)
Sodium: 139 mEq/L (ref 135–145)
Total Bilirubin: 0.5 mg/dL (ref 0.3–1.2)
Total Protein: 7.2 g/dL (ref 6.0–8.3)

## 2013-11-04 LAB — LIPID PANEL
Cholesterol: 230 mg/dL — ABNORMAL HIGH (ref 0–200)
HDL: 32.1 mg/dL — ABNORMAL LOW (ref >39.00)
Total CHOL/HDL Ratio: 7
Triglycerides: 242 mg/dL — ABNORMAL HIGH (ref 0.0–149.0)
VLDL: 48.4 mg/dL — ABNORMAL HIGH (ref 0.0–40.0)

## 2013-11-04 LAB — LDL CHOLESTEROL, DIRECT: Direct LDL: 144.3 mg/dL

## 2013-11-04 NOTE — Telephone Encounter (Signed)
Message copied by Jinny Sanders on Fri Nov 04, 2013  8:24 AM ------      Message from: Ellamae Sia      Created: Tue Nov 01, 2013  5:31 PM      Regarding: Lab orders for Friday, 1.16.15       Lab orders for a 6 month f/u, Thanks ------

## 2013-11-05 LAB — VITAMIN D 25 HYDROXY (VIT D DEFICIENCY, FRACTURES): Vit D, 25-Hydroxy: 63 ng/mL (ref 30–89)

## 2013-11-11 ENCOUNTER — Ambulatory Visit (INDEPENDENT_AMBULATORY_CARE_PROVIDER_SITE_OTHER): Payer: Medicare Other | Admitting: Family Medicine

## 2013-11-11 ENCOUNTER — Encounter: Payer: Self-pay | Admitting: Family Medicine

## 2013-11-11 VITALS — BP 120/70 | HR 65 | Temp 97.8°F | Ht 64.0 in | Wt 154.5 lb

## 2013-11-11 DIAGNOSIS — R7309 Other abnormal glucose: Secondary | ICD-10-CM

## 2013-11-11 DIAGNOSIS — I10 Essential (primary) hypertension: Secondary | ICD-10-CM

## 2013-11-11 DIAGNOSIS — E785 Hyperlipidemia, unspecified: Secondary | ICD-10-CM

## 2013-11-11 NOTE — Assessment & Plan Note (Signed)
Poor control. Discussed cholesterol and diet in detail. Info given.

## 2013-11-11 NOTE — Assessment & Plan Note (Signed)
Improved control . Encouraged exercise, weight loss, healthy eating habits.  

## 2013-11-11 NOTE — Patient Instructions (Addendum)
Get back on track with healthy lifestyle, and low cholesterol diet. Fish oil 1000 mg twice daily. Schedule medicare wellness in 6 months fasting labs prior.  Hypercholesterolemia High Blood Cholesterol Cholesterol is a white, waxy, fat-like protein needed by your body in small amounts. The liver makes all the cholesterol you need. It is carried from the liver by the blood through the blood vessels. Deposits (plaque) may build up on blood vessel walls. This makes the arteries narrower and stiffer. Plaque increases the risk for heart attack and stroke. You cannot feel your cholesterol level even if it is very high. The only way to know is by a blood test to check your lipid (fats) levels. Once you know your cholesterol levels, you should keep a record of the test results. Work with your caregiver to to keep your levels in the desired range. WHAT THE RESULTS MEAN:  Total cholesterol is a rough measure of all the cholesterol in your blood.  LDL is the so-called bad cholesterol. This is the type that deposits cholesterol in the walls of the arteries. You want this level to be low.  HDL is the good cholesterol because it cleans the arteries and carries the LDL away. You want this level to be high.  Triglycerides are fat that the body can either burn for energy or store. High levels are closely linked to heart disease. DESIRED LEVELS:  Total cholesterol below 200.  LDL below 100 for people at risk, below 70 for very high risk.  HDL above 50 is good, above 60 is best.  Triglycerides below 150. HOW TO LOWER YOUR CHOLESTEROL:  Diet.  Choose fish or white meat chicken and [place], roasted or baked. Limit fatty cuts of red meat, fried foods, and processed meats, such as sausage and lunch meat.  Eat lots of fresh fruits and vegetables. Choose whole grains, beans, pasta, potatoes and cereals.  Use only small amounts of olive, corn or canola oils. Avoid butter, mayonnaise, shortening or palm kernel  oils. Avoid foods with trans-fats.  Use skim/nonfat milk and low-fat/nonfat yogurt and cheeses. Avoid whole milk, cream, ice cream, egg yolks and cheeses. Healthy desserts include angel food cake, gingersnaps, animal crackers, hard candy, popsicles, and low-fat/nonfat frozen yogurt. Avoid pastries, cakes, pies and cookies.  Exercise.  A regular program helps decrease LDL and raises HDL.  Helps with weight control.  Do things that increase your activity level like gardening, walking, or taking the stairs.  Medication.  May be prescribed by your caregiver to help lowering cholesterol and the risk for heart disease.  You may need medicine even if your levels are normal if you have several risk factors. HOME CARE INSTRUCTIONS   Follow your diet and exercise programs as suggested by your caregiver.  Take medications as directed.  Have blood work done when your caregiver feels it is necessary. MAKE SURE YOU:   Understand these instructions.  Will watch your condition.  Will get help right away if you are not doing well or get worse. Document Released: 10/06/2005 Document Revised: 12/29/2011 Document Reviewed: 03/24/2007 [facility] Patient Information 2014 [place].

## 2013-11-11 NOTE — Assessment & Plan Note (Signed)
Well controlled. Continue current medication.  

## 2013-11-11 NOTE — Progress Notes (Signed)
Hypertension: On metoprolol, lisinopril/HCTZ  BP Readings from Last 3 Encounters:  11/11/13 120/70  06/16/13 127/65  06/06/13 107/60  Using medication without problems or lightheadedness: none  Chest pain with exertion:None  Edema:None  Short of breath:None  Average home BPs: well controlled at home.. 120-130/80 Other issues:   Elevated Cholesterol: Well controlled at goal on no med.  Lab Results  Component Value Date   CHOL 230* 11/04/2013   HDL 32.10* 11/04/2013   LDLCALC 128* 06/12/2009   LDLDIRECT 144.3 11/04/2013   TRIG 242.0* 11/04/2013   CHOLHDL 7 11/04/2013   Diet compliance: good, except during the holidays Exercise: walking, but limited due to knee surgery. Other complaints:   Prediabetes: Improved some from last year.   Knee replacement went well, left knee.  Review of Systems  Constitutional: Negative for fever, fatigue and unexpected weight change.  HENT: Negative for ear pain, congestion, sore throat, sneezing, trouble swallowing and sinus pressure.  Eyes: Negative for pain and itching.  Respiratory: Negative for cough, shortness of breath and wheezing.  Cardiovascular: Negative for chest pain, palpitations and leg swelling.  Gastrointestinal: Negative for nausea, abdominal pain, diarrhea, constipation and blood in stool.  GERD, intermittantly usingn omeprazole  Genitourinary: . Negative for dysuria, hematuria, vaginal discharge and difficulty urinating.  Skin: Negative for rash.  Neurological: Negative for syncope, weakness, light-headedness, numbness and headaches.   Left low back pain at times... Notes a catch gets her then works out as she walks. Psychiatric/Behavioral: Negative for confusion and dysphoric mood. The patient is not nervous/anxious.  Objective:   Physical Exam  Constitutional: Vital signs are normal. She appears well-developed and well-nourished. She is cooperative. Non-toxic appearance. She does not appear ill. No distress.  Overweight,  elderly  HENT:  Head: Normocephalic.  Right Ear: Hearing, tympanic membrane, external ear and ear canal normal.  Left Ear: Hearing, tympanic membrane, external ear and ear canal normal.  Nose: Nose normal.  Eyes: Conjunctivae, EOM and lids are normal. Pupils are equal, round, and reactive to light. No foreign bodies found.  Neck: Trachea normal and normal range of motion. Neck supple. Carotid bruit is not present. No mass and no thyromegaly present.  Cardiovascular: Normal rate, regular rhythm, S1 normal, S2 normal, normal heart sounds and intact distal pulses. Exam reveals no gallop.  No murmur heard.  Pulmonary/Chest: Effort normal and breath sounds normal. No respiratory distress. She has no wheezes. She has no rhonchi. She has no rales.  Abdominal: Soft. Normal appearance and bowel sounds are normal. She exhibits no distension, no fluid wave, no abdominal bruit and no mass. There is no hepatosplenomegaly. There is no tenderness. There is no rebound, no guarding and no CVA tenderness. No hernia.  Lymphadenopathy:  She has no cervical adenopathy.  She has no axillary adenopathy.  Neurological: She is alert. She has normal strength. No cranial nerve deficit or sensory deficit.  Skin: Skin is warm, dry and intact. No rash noted.  Psychiatric: Her speech is normal and behavior is normal. Judgment normal. Her mood appears not anxious. Cognition and memory are normal. She does not exhibit a depressed mood.

## 2013-11-11 NOTE — Progress Notes (Signed)
Pre-visit discussion using our clinic review tool. No additional management support is needed unless otherwise documented below in the visit note.  

## 2013-11-23 ENCOUNTER — Telehealth: Payer: Self-pay | Admitting: Family Medicine

## 2013-11-23 NOTE — Telephone Encounter (Signed)
Relevant patient education mailed to patient.  

## 2013-12-27 DIAGNOSIS — H353190 Nonexudative age-related macular degeneration, unspecified eye, stage unspecified: Secondary | ICD-10-CM | POA: Diagnosis not present

## 2013-12-27 DIAGNOSIS — H31019 Macula scars of posterior pole (postinflammatory) (post-traumatic), unspecified eye: Secondary | ICD-10-CM | POA: Diagnosis not present

## 2013-12-27 DIAGNOSIS — H353290 Exudative age-related macular degeneration, unspecified eye, stage unspecified: Secondary | ICD-10-CM | POA: Diagnosis not present

## 2013-12-27 DIAGNOSIS — H43819 Vitreous degeneration, unspecified eye: Secondary | ICD-10-CM | POA: Diagnosis not present

## 2014-03-03 ENCOUNTER — Encounter: Payer: Self-pay | Admitting: Family Medicine

## 2014-03-03 ENCOUNTER — Ambulatory Visit (INDEPENDENT_AMBULATORY_CARE_PROVIDER_SITE_OTHER): Payer: Medicare Other | Admitting: Family Medicine

## 2014-03-03 VITALS — BP 120/60 | HR 61 | Temp 97.6°F | Ht 64.0 in | Wt 158.5 lb

## 2014-03-03 DIAGNOSIS — H6120 Impacted cerumen, unspecified ear: Secondary | ICD-10-CM | POA: Insufficient documentation

## 2014-03-03 DIAGNOSIS — B9789 Other viral agents as the cause of diseases classified elsewhere: Principal | ICD-10-CM

## 2014-03-03 DIAGNOSIS — J069 Acute upper respiratory infection, unspecified: Secondary | ICD-10-CM

## 2014-03-03 MED ORDER — BENZONATATE 200 MG PO CAPS: 200.0000 mg | ORAL_CAPSULE | Freq: Two times a day (BID) | ORAL | Status: DC | PRN

## 2014-03-03 NOTE — Progress Notes (Signed)
Pre visit review using our clinic review tool, if applicable. No additional management support is needed unless otherwise documented below in the visit note. 

## 2014-03-03 NOTE — Patient Instructions (Signed)
Can use mucinex plain to break up mucus.Can use prescription benzonatate for cough.  Start zyrtec at bedtime for allergies.  Call if not continuing to improve in the next week. Go to ER severe shortness of breath.

## 2014-03-03 NOTE — Addendum Note (Signed)
Addended by: Eliezer Lofts E on: 03/03/2014 10:34 AM   Modules accepted: Level of Service

## 2014-03-03 NOTE — Progress Notes (Addendum)
   Subjective:    Patient ID: [name], female    DOB: 1925-01-26, 80 y.o.   MRN: [id number]  Cough This is a new problem. The current episode started in the past 7 days. The problem has been gradually improving. The cough is productive of sputum. Associated symptoms include nasal congestion, postnasal drip and shortness of breath. Pertinent negatives include no chills, ear congestion, ear pain, fever, headaches, hemoptysis, rash, rhinorrhea, sore throat or wheezing. Associated symptoms comments: Gets out of breath with coughing fit, DOE in last week. The symptoms are aggravated by lying down. Risk factors: non smoker. She has tried OTC cough suppressant (nyquil, robitussin) for the symptoms. The treatment provided mild relief. There is no history of asthma, bronchiectasis, COPD, emphysema, environmental allergies or pneumonia.    BP Readings from Last 3 Encounters:  03/03/14 120/60  11/11/13 120/70  06/16/13 127/65      Review of Systems  Constitutional: Negative for fever and chills.  HENT: Positive for postnasal drip. Negative for ear pain, rhinorrhea and sore throat.   Respiratory: Positive for cough and shortness of breath. Negative for hemoptysis and wheezing.   Skin: Negative for rash.  Allergic/Immunologic: Negative for environmental allergies.  Neurological: Negative for headaches.       Objective:   Physical Exam  Constitutional: Vital signs are normal. She appears well-developed and well-nourished. She is cooperative.  Non-toxic appearance. She does not appear ill. No distress.  HENT:  Head: Normocephalic.  Right Ear: Hearing, tympanic membrane, external ear and ear canal normal. Tympanic membrane is not erythematous, not retracted and not bulging.  Left Ear: Hearing, tympanic membrane, external ear and ear canal normal. Tympanic membrane is not erythematous, not retracted and not bulging.  Nose: Mucosal edema and rhinorrhea present. Right sinus exhibits no maxillary  sinus tenderness and no frontal sinus tenderness. Left sinus exhibits no maxillary sinus tenderness and no frontal sinus tenderness.  Mouth/Throat: Uvula is midline and mucous membranes are normal. Posterior oropharyngeal erythema present. No oropharyngeal exudate or posterior oropharyngeal edema.  Post nasal drip  Eyes: Conjunctivae, EOM and lids are normal. Pupils are equal, round, and reactive to light. Lids are everted and swept, no foreign bodies found.  Neck: Trachea normal and normal range of motion. Neck supple. Carotid bruit is not present. No mass and no thyromegaly present.  Cardiovascular: Normal rate, regular rhythm, S1 normal, S2 normal, normal heart sounds, intact distal pulses and normal pulses.  Exam reveals no gallop and no friction rub.   No murmur heard. Pulmonary/Chest: Effort normal and breath sounds normal. Not tachypneic. No respiratory distress. She has no decreased breath sounds. She has no wheezes. She has no rhonchi. She has no rales.  Neurological: She is alert.  Skin: Skin is warm, dry and intact. No rash noted.  Psychiatric: Her speech is normal and behavior is normal. Judgment normal. Her mood appears not anxious. Cognition and memory are normal. She does not exhibit a depressed mood.          Assessment & Plan:  Procedure: B cerumen impaction treated with irrigation with minimal success. Alligator forceps used by MD fwith successful removal of B impacted wax. NO bleeding , no complications.

## 2014-03-03 NOTE — Assessment & Plan Note (Signed)
IMproving. MAy have allergy component. Treat with benzonatate, start zyrtec and use mucinex. Rest and fluids.

## 2014-04-26 ENCOUNTER — Telehealth: Payer: Self-pay | Admitting: Family Medicine

## 2014-04-26 NOTE — Telephone Encounter (Signed)
Patient Information:  Caller Name: [place]  Phone: [phone]  Patient: [name], [name]  Gender: Female  DOB: 1924/12/08  Age: 80 Years  PCP: Eliezer Lofts (Family Practice)  Office Follow Up:  Does the office need to follow up with this patient?: Yes  Instructions For The Office: Disposition : See today .   No appt at [facility], [place], [place] appt at [place] but pt doesn't feel safe driving that far.  She requests if ok to wait for appt tomorrow or would MD prefer her to be seen today.  Please follow up with advice and if ok to wait till tomorrow, please call with appt.  RN Note:  Advised pt of need for appt today.  No available appts at [facility], [place], or [place].  Offered appt at [place] location but pt doesn't feel safe driving that far.   She requests if ok to wait for appt tomorrow or would MD prefer her to be seen today.  Please follow up with advice and if ok to wait till tomorrow, please call with appt.  Symptoms  Reason For Call & Symptoms: Off and on pt has had a "catch" in her hip x 6 months.   04/24/14 pain worsened in hip/lower left back.  04/25/14 pt could hardly walk due to pain, rated 10/10..  04/26/14 pain is better today, but still continues to have pain in left hip/lower back, rates 6-7/10.  Able to walk, but painful to do so.  No changes or problems with bowels/bladder.   Afebrile.  Reviewed Health History In EMR: Yes  Reviewed Medications In EMR: Yes  Reviewed Allergies In EMR: Yes  Reviewed Surgeries / Procedures: Yes  Date of Onset of Symptoms: 04/24/2014  Guideline(s) Used:  Back Pain  Disposition Per Guideline:   See Today in Office  Reason For Disposition Reached:   Can't walk or can barely walk  Advice Given:  Cold or Heat:  Cold Pack: For pain or swelling, use a cold pack or ice wrapped in a wet cloth. Put it on the sore area for 20 minutes. Repeat 4 times on the first day, then as needed.  Heat Pack: If pain lasts over 2 days,  apply heat to the sore area. Use a heat pack, heating pad, or warm wet washcloth. Do this for 10 minutes, then as needed. For widespread stiffness, take a hot bath or hot shower instead. Move the sore area under the warm water.  Activity  Keep doing your day-to-day activities if it is not too painful. Staying active is better than resting.  Avoid anything that makes your pain worse. Avoid heavy lifting, twisting, and too much exercise until your back heals.  Pain Medicines:  For pain relief, take acetaminophen, ibuprofen, or naproxen.  Call Back If:  Numbness or weakness occur  Bowel/bladder problems occur  You become worse.  Patient Will Follow Care Advice:  YES

## 2014-04-26 NOTE — Telephone Encounter (Signed)
This is non-urgent and can be seen in the next 1-2 weeks.

## 2014-04-26 NOTE — Telephone Encounter (Signed)
Spoke with [name].  Offered her an appointment to see Dr. Lorelei Pont or Dr. Diona Browner in the next couple of days.  Patient states she is doing better now.  She states she had a lot of people over for the 4th of July and thinks she over did it.  She states it feels like a catch which makes it hard for her to walk.  She states she had been out in the yard moving around and it seems to be better for now.  She is scheduled to see Dr. Diona Browner for Medicare Wellness on 05/12/2014.  She will wait to be seen then.  Advised if the "catch" continues to give her problems, to call me back and I will get her in to see Dr. Diona Browner or Dr. Lorelei Pont before July 24.  Patient states understanding.

## 2014-04-28 ENCOUNTER — Ambulatory Visit (INDEPENDENT_AMBULATORY_CARE_PROVIDER_SITE_OTHER): Payer: Medicare Other | Admitting: Family Medicine

## 2014-04-28 ENCOUNTER — Encounter: Payer: Self-pay | Admitting: Family Medicine

## 2014-04-28 VITALS — BP 180/80 | HR 72 | Temp 97.9°F | Wt 160.5 lb

## 2014-04-28 DIAGNOSIS — M545 Low back pain, unspecified: Secondary | ICD-10-CM | POA: Diagnosis not present

## 2014-04-28 MED ORDER — HYDROCODONE-ACETAMINOPHEN 7.5-325 MG PO TABS: ORAL_TABLET | ORAL | Status: DC

## 2014-04-28 NOTE — Progress Notes (Signed)
Pre visit review using our clinic review tool, if applicable. No additional management support is needed unless otherwise documented below in the visit note.  She had a "catch" with walking in the last few months.  In the L lower back.  It was much worse earlier in the week, then better for a few days, then worse again today.  No weakness in her leg. No fevers.  No vomiting.  No burning with urination.  No R sided back pain.  She had been more active recently with a big celebration for the 4th, was on her feet a lot.  No radicular pain.    Compliant with BP meds.  Out of pain meds.  Had used hydrocodone prev for her knees, w/o ADE.    Recheck BP 160/80.    Meds, vitals, and allergies reviewed.   ROS: See HPI.  Otherwise, noncontributory.  nad ncat Mmm rrr ctab abd soft, not ttp Back w/o midline pain.  Gait wnl now. SLR neg B S/S wnl for the BLE

## 2014-04-28 NOTE — Patient Instructions (Signed)
Use a heating pad and then try the hydrocodone if needed.  I think you'll likely not need the hydrocodone.

## 2014-04-30 DIAGNOSIS — M545 Low back pain, unspecified: Secondary | ICD-10-CM | POA: Insufficient documentation

## 2014-04-30 NOTE — Assessment & Plan Note (Signed)
Not a great candidate for nsaids or prednisone.  D/w pt.  Likely a strain, gently stretch and use heat, the hydrocodone if pain returns.  She agrees.  I think she'll likely not need the hydrocodone. Opiate cautions given to patient.

## 2014-05-05 ENCOUNTER — Telehealth: Payer: Self-pay | Admitting: Family Medicine

## 2014-05-05 ENCOUNTER — Other Ambulatory Visit (INDEPENDENT_AMBULATORY_CARE_PROVIDER_SITE_OTHER): Payer: Medicare Other

## 2014-05-05 DIAGNOSIS — R7309 Other abnormal glucose: Secondary | ICD-10-CM | POA: Diagnosis not present

## 2014-05-05 DIAGNOSIS — K279 Peptic ulcer, site unspecified, unspecified as acute or chronic, without hemorrhage or perforation: Secondary | ICD-10-CM

## 2014-05-05 DIAGNOSIS — E785 Hyperlipidemia, unspecified: Secondary | ICD-10-CM

## 2014-05-05 DIAGNOSIS — R7303 Prediabetes: Secondary | ICD-10-CM

## 2014-05-05 DIAGNOSIS — M899 Disorder of bone, unspecified: Secondary | ICD-10-CM

## 2014-05-05 DIAGNOSIS — M949 Disorder of cartilage, unspecified: Secondary | ICD-10-CM

## 2014-05-05 LAB — COMPREHENSIVE METABOLIC PANEL
ALT: 13 U/L (ref 0–35)
AST: 16 U/L (ref 0–37)
Albumin: 3.6 g/dL (ref 3.5–5.2)
Alkaline Phosphatase: 57 U/L (ref 39–117)
BUN: 29 mg/dL — ABNORMAL HIGH (ref 6–23)
CO2: 28 mEq/L (ref 19–32)
Calcium: 9.7 mg/dL (ref 8.4–10.5)
Chloride: 104 mEq/L (ref 96–112)
Creatinine, Ser: 1.1 mg/dL (ref 0.4–1.2)
GFR: 49.98 mL/min — ABNORMAL LOW (ref >60.00)
Glucose, Bld: 116 mg/dL — ABNORMAL HIGH (ref 70–99)
Potassium: 4.7 mEq/L (ref 3.5–5.1)
Sodium: 137 mEq/L (ref 135–145)
Total Bilirubin: 0.4 mg/dL (ref 0.2–1.2)
Total Protein: 6.9 g/dL (ref 6.0–8.3)

## 2014-05-05 LAB — LIPID PANEL
Cholesterol: 205 mg/dL — ABNORMAL HIGH (ref 0–200)
HDL: 32 mg/dL — ABNORMAL LOW (ref >39.00)
LDL Cholesterol: 126 mg/dL — ABNORMAL HIGH (ref 0–99)
NonHDL: 173
Total CHOL/HDL Ratio: 6
Triglycerides: 236 mg/dL — ABNORMAL HIGH (ref 0.0–149.0)
VLDL: 47.2 mg/dL — ABNORMAL HIGH (ref 0.0–40.0)

## 2014-05-05 LAB — HEMOGLOBIN A1C: Hgb A1c MFr Bld: 6.3 % (ref 4.6–6.5)

## 2014-05-05 NOTE — Telephone Encounter (Signed)
Message copied by Eliezer Lofts E on Fri May 05, 2014  8:25 AM ------      Message from: Selinda Orion J      Created: Wed May 03, 2014  3:22 PM      Regarding: Lab orders for [place],7.17.15       Patient is scheduled for CPX labs, please order future labs, Thanks , Terri       ------

## 2014-05-12 ENCOUNTER — Encounter: Payer: Medicare HMO | Admitting: Family Medicine

## 2014-06-11 DIAGNOSIS — H3530 Unspecified macular degeneration: Secondary | ICD-10-CM | POA: Diagnosis not present

## 2014-06-13 ENCOUNTER — Ambulatory Visit (INDEPENDENT_AMBULATORY_CARE_PROVIDER_SITE_OTHER): Payer: Medicare Other | Admitting: Family Medicine

## 2014-06-13 ENCOUNTER — Encounter: Payer: Self-pay | Admitting: Family Medicine

## 2014-06-13 VITALS — BP 116/60 | HR 63 | Temp 98.1°F | Ht 64.5 in | Wt 159.2 lb

## 2014-06-13 DIAGNOSIS — Z23 Encounter for immunization: Secondary | ICD-10-CM

## 2014-06-13 DIAGNOSIS — Z0000 Encounter for general adult medical examination without abnormal findings: Secondary | ICD-10-CM | POA: Diagnosis not present

## 2014-06-13 MED ORDER — METOPROLOL TARTRATE 25 MG PO TABS: 25.0000 mg | ORAL_TABLET | Freq: Two times a day (BID) | ORAL | Status: DC

## 2014-06-13 MED ORDER — OMEPRAZOLE 40 MG PO CPDR: 40.0000 mg | DELAYED_RELEASE_CAPSULE | Freq: Every day | ORAL | Status: DC

## 2014-06-13 MED ORDER — METOPROLOL TARTRATE 50 MG PO TABS: 50.0000 mg | ORAL_TABLET | Freq: Two times a day (BID) | ORAL | Status: DC

## 2014-06-13 MED ORDER — LISINOPRIL-HYDROCHLOROTHIAZIDE 20-25 MG PO TABS: 1.0000 | ORAL_TABLET | Freq: Every morning | ORAL | Status: DC

## 2014-06-13 NOTE — Progress Notes (Signed)
Pre visit review using our clinic review tool, if applicable. No additional management support is needed unless otherwise documented below in the visit note. 

## 2014-06-13 NOTE — Addendum Note (Signed)
Addended by: Modena Nunnery on: 06/13/2014 01:07 PM   Modules accepted: Orders

## 2014-06-13 NOTE — Progress Notes (Signed)
HPI  I have personally reviewed the Medicare Annual Wellness questionnaire and have noted  1. The patient's medical and social history  2. Their use of alcohol, tobacco or illicit drugs  3. Their current medications and supplements  4. The patient's functional ability including ADL's, fall risks, home safety risks and hearing or visual  impairment.  5. Diet and physical activities  6. Evidence for depression or mood disorders  The patients weight, height, BMI and visual acuity have been recorded in the chart  I have made referrals, counseling and provided education to the patient based review of the above and I have provided the pt with a written personalized care plan for preventive services.   Hypertension: Well controlled on metoprolol, lisinopril/HCTZ  BP Readings from Last 3 Encounters:  06/13/14 116/60  04/28/14 180/80  03/03/14 120/60  Using medication without problems or lightheadedness: none  Chest pain with exertion:None  Edema:None  Short of breath:None  Average home BPs: well controlled at home.. 134/80s  Other issues:   Elevated Cholesterol: Well controlled at goal  < 130 on no med.  Lab Results  Component Value Date   CHOL 205* 05/05/2014   HDL 32.00* 05/05/2014   LDLCALC 126* 05/05/2014   LDLDIRECT 144.3 11/04/2013   TRIG 236.0* 05/05/2014   CHOLHDL 6 05/05/2014  Diet compliance: good  Exercise: Probation officer...occ walking  Other complaints:   Prediabetes: Blood sugar elevated from last year.  Had been eating a lot of sweets prior to labs. She has gotten back on track now.   Lab Results  Component Value Date   HGBA1C 6.3 05/05/2014     Review of Systems  Constitutional: Negative for fever, fatigue and unexpected weight change.  HENT: Negative for ear pain, congestion, sore throat, sneezing, trouble swallowing and sinus pressure.  Eyes: Negative for pain and itching.  Respiratory: Negative for cough, shortness of breath and wheezing.  Cardiovascular: Negative  for chest pain, palpitations and leg swelling.  Gastrointestinal: Negative for nausea, abdominal pain, diarrhea, constipation and blood in stool.  GERD, intermittantly using omeprazole  Genitourinary: Positive for frequency. Negative for dysuria, hematuria, vaginal discharge and difficulty urinating.  Skin: Negative for rash.  Neurological: Negative for syncope, weakness, light-headedness, numbness and headaches.  No recent falls  Psychiatric/Behavioral: Negative for confusion and dysphoric mood. The patient is not nervous/anxious.  Objective:   Physical Exam  Constitutional: Vital signs are normal. She appears well-developed and well-nourished. She is cooperative. Non-toxic appearance. She does not appear ill. No distress.  Overweight, elderly  HENT:  Head: Normocephalic.  Right Ear: Hearing, tympanic membrane, external ear and ear canal normal.  Left Ear: Hearing, tympanic membrane, external ear and ear canal normal.  Nose: Nose normal.  Eyes: Conjunctivae, EOM and lids are normal. Pupils are equal, round, and reactive to light. No foreign bodies found.  Neck: Trachea normal and normal range of motion. Neck supple. Carotid bruit is not present. No mass and no thyromegaly present.  Cardiovascular: Normal rate, regular rhythm, S1 normal, S2 normal, normal heart sounds and intact distal pulses. Exam reveals no gallop.  No murmur heard.  Pulmonary/Chest: Effort normal and breath sounds normal. No respiratory distress. She has no wheezes. She has no rhonchi. She has no rales.  Abdominal: Soft. Normal appearance and bowel sounds are normal. She exhibits no distension, no fluid wave, no abdominal bruit and no mass. There is no hepatosplenomegaly. There is no tenderness. There is no rebound, no guarding and no CVA tenderness. No hernia.  Lymphadenopathy:  She has no cervical adenopathy.  She has no axillary adenopathy.  Neurological: She is alert. She has normal strength. No cranial nerve deficit  or sensory deficit.  Breast exam: mosquito bites B breast ( red itchy macule), no mass Skin: Skin is warm, dry and intact. No rash noted.  Psychiatric: Her speech is normal and behavior is normal. Judgment normal. Her mood appears not anxious. Cognition and memory are normal. She does not exhibit a depressed mood.  Assessment & Plan:   The patient's preventative maintenance and recommended screening tests for an annual wellness exam were reviewed in full today.  Brought up to date unless services declined.  Counselled on the importance of diet, exercise, and its role in overall health and mortality.  The patient's FH and SH was reviewed, including their home life, tobacco status, and drug and alcohol status.   Vaccines:Uptodate with vaccines Td, PNA and zostavax, due for flu and prevnar Colon: incomplete colonoscopy 2009.Marland Kitchen Repeat prn only.  Pap/DVE not indicated.  Mammo: likes to continue, sister and daughter with breast cancer.  Last nml 07/2013, will plan every 2 years. Non smoker  DEXA: last in 2014 osteopenia stable , repeat in 2-5 years.Marland Kitchen

## 2014-06-13 NOTE — Patient Instructions (Signed)
Return when ready for the flu. Work on low carbohydrate diet. Work on increasing exercising as able.

## 2014-06-19 ENCOUNTER — Telehealth: Payer: Self-pay | Admitting: *Deleted

## 2014-06-19 NOTE — Telephone Encounter (Signed)
[facility] pharmacy is requesting clarification on the Metoprolol rx's they received last week. They request clarification on both the 25 and 50 mg directions. I will place fax form in your inbox.

## 2014-06-20 NOTE — Telephone Encounter (Signed)
In outbox

## 2014-06-20 NOTE — Telephone Encounter (Signed)
Clarification form faxed to [place].

## 2014-06-21 DIAGNOSIS — Z96659 Presence of unspecified artificial knee joint: Secondary | ICD-10-CM | POA: Diagnosis not present

## 2014-07-05 DIAGNOSIS — H353290 Exudative age-related macular degeneration, unspecified eye, stage unspecified: Secondary | ICD-10-CM | POA: Diagnosis not present

## 2014-07-05 DIAGNOSIS — H43819 Vitreous degeneration, unspecified eye: Secondary | ICD-10-CM | POA: Diagnosis not present

## 2014-07-05 DIAGNOSIS — H353190 Nonexudative age-related macular degeneration, unspecified eye, stage unspecified: Secondary | ICD-10-CM | POA: Diagnosis not present

## 2014-07-19 DIAGNOSIS — Z23 Encounter for immunization: Secondary | ICD-10-CM | POA: Diagnosis not present

## 2015-01-03 DIAGNOSIS — H3532 Exudative age-related macular degeneration: Secondary | ICD-10-CM | POA: Diagnosis not present

## 2015-01-03 DIAGNOSIS — H3531 Nonexudative age-related macular degeneration: Secondary | ICD-10-CM | POA: Diagnosis not present

## 2015-06-08 ENCOUNTER — Other Ambulatory Visit: Payer: Self-pay | Admitting: Family Medicine

## 2015-06-14 ENCOUNTER — Telehealth: Payer: Self-pay | Admitting: Family Medicine

## 2015-06-14 ENCOUNTER — Other Ambulatory Visit (INDEPENDENT_AMBULATORY_CARE_PROVIDER_SITE_OTHER): Payer: Medicare Other

## 2015-06-14 DIAGNOSIS — D500 Iron deficiency anemia secondary to blood loss (chronic): Secondary | ICD-10-CM

## 2015-06-14 DIAGNOSIS — R7309 Other abnormal glucose: Secondary | ICD-10-CM

## 2015-06-14 DIAGNOSIS — R7303 Prediabetes: Secondary | ICD-10-CM

## 2015-06-14 DIAGNOSIS — E785 Hyperlipidemia, unspecified: Secondary | ICD-10-CM

## 2015-06-14 LAB — CBC WITH DIFFERENTIAL/PLATELET
Basophils Absolute: 0 10*3/uL (ref 0.0–0.1)
Basophils Relative: 0.6 % (ref 0.0–3.0)
Eosinophils Absolute: 0.2 10*3/uL (ref 0.0–0.7)
Eosinophils Relative: 3.1 % (ref 0.0–5.0)
HCT: 38.2 % (ref 36.0–46.0)
Hemoglobin: 13 g/dL (ref 12.0–15.0)
Lymphocytes Relative: 33.1 % (ref 12.0–46.0)
Lymphs Abs: 2.2 10*3/uL (ref 0.7–4.0)
MCHC: 34 g/dL (ref 30.0–36.0)
MCV: 91.7 fl (ref 78.0–100.0)
Monocytes Absolute: 0.6 10*3/uL (ref 0.1–1.0)
Monocytes Relative: 8.6 % (ref 3.0–12.0)
Neutro Abs: 3.7 10*3/uL (ref 1.4–7.7)
Neutrophils Relative %: 54.6 % (ref 43.0–77.0)
Platelets: 214 10*3/uL (ref 150.0–400.0)
RBC: 4.16 Mil/uL (ref 3.87–5.11)
RDW: 13.6 % (ref 11.5–15.5)
WBC: 6.8 10*3/uL (ref 4.0–10.5)

## 2015-06-14 LAB — COMPREHENSIVE METABOLIC PANEL
ALT: 11 U/L (ref 0–35)
AST: 15 U/L (ref 0–37)
Albumin: 3.8 g/dL (ref 3.5–5.2)
Alkaline Phosphatase: 46 U/L (ref 39–117)
BUN: 28 mg/dL — ABNORMAL HIGH (ref 6–23)
CO2: 29 mEq/L (ref 19–32)
Calcium: 9.8 mg/dL (ref 8.4–10.5)
Chloride: 103 mEq/L (ref 96–112)
Creatinine, Ser: 1.06 mg/dL (ref 0.40–1.20)
GFR: 52.03 mL/min — ABNORMAL LOW (ref >60.00)
Glucose, Bld: 101 mg/dL — ABNORMAL HIGH (ref 70–99)
Potassium: 4 mEq/L (ref 3.5–5.1)
Sodium: 139 mEq/L (ref 135–145)
Total Bilirubin: 0.4 mg/dL (ref 0.2–1.2)
Total Protein: 6.6 g/dL (ref 6.0–8.3)

## 2015-06-14 LAB — LDL CHOLESTEROL, DIRECT: Direct LDL: 104 mg/dL

## 2015-06-14 LAB — LIPID PANEL
Cholesterol: 204 mg/dL — ABNORMAL HIGH (ref 0–200)
HDL: 33.8 mg/dL — ABNORMAL LOW (ref >39.00)
NonHDL: 170.1
Total CHOL/HDL Ratio: 6
Triglycerides: 243 mg/dL — ABNORMAL HIGH (ref 0.0–149.0)
VLDL: 48.6 mg/dL — ABNORMAL HIGH (ref 0.0–40.0)

## 2015-06-14 LAB — HEMOGLOBIN A1C: Hgb A1c MFr Bld: 6 % (ref 4.6–6.5)

## 2015-06-14 NOTE — Telephone Encounter (Signed)
-----   Message from Ellamae Sia sent at 06/06/2015 12:04 PM EDT ----- Regarding: Lab orders for Thursdday, 8.25.16 Patient is scheduled for CPX labs, please order future labs, Thanks , Karna Christmas

## 2015-06-19 ENCOUNTER — Ambulatory Visit (INDEPENDENT_AMBULATORY_CARE_PROVIDER_SITE_OTHER): Payer: Medicare Other | Admitting: Family Medicine

## 2015-06-19 ENCOUNTER — Encounter: Payer: Self-pay | Admitting: Family Medicine

## 2015-06-19 VITALS — BP 122/68 | HR 84 | Temp 97.8°F | Ht 64.0 in | Wt 157.0 lb

## 2015-06-19 DIAGNOSIS — R7309 Other abnormal glucose: Secondary | ICD-10-CM | POA: Diagnosis not present

## 2015-06-19 DIAGNOSIS — I10 Essential (primary) hypertension: Secondary | ICD-10-CM

## 2015-06-19 DIAGNOSIS — Z0000 Encounter for general adult medical examination without abnormal findings: Secondary | ICD-10-CM | POA: Diagnosis not present

## 2015-06-19 DIAGNOSIS — M545 Low back pain, unspecified: Secondary | ICD-10-CM

## 2015-06-19 DIAGNOSIS — E785 Hyperlipidemia, unspecified: Secondary | ICD-10-CM

## 2015-06-19 DIAGNOSIS — R7303 Prediabetes: Secondary | ICD-10-CM

## 2015-06-19 NOTE — Progress Notes (Signed)
Pre visit review using our clinic review tool, if applicable. No additional management support is needed unless otherwise documented below in the visit note. 

## 2015-06-19 NOTE — Assessment & Plan Note (Signed)
Improving control with diet change.

## 2015-06-19 NOTE — Assessment & Plan Note (Signed)
Well controlled. Continue current medication.  

## 2015-06-19 NOTE — Assessment & Plan Note (Signed)
Mild sciatica versus muscle strain. Conservsative measures.

## 2015-06-19 NOTE — Assessment & Plan Note (Signed)
LDL at goal on no meds, trigs higha nd HDL low. Encouraged exercise, weight maintanence and healthy eating habits.

## 2015-06-19 NOTE — Progress Notes (Signed)
HPI  I have personally reviewed the Medicare Annual Wellness questionnaire and have noted  1. The patient's medical and social history  2. Their use of alcohol, tobacco or illicit drugs  3. Their current medications and supplements  4. The patient's functional ability including ADL's, fall risks, home safety risks and hearing or visual  impairment.  5. Diet and physical activities  6. Evidence for depression or mood disorders  The patients weight, height, BMI and visual acuity have been recorded in the chart  I have made referrals, counseling and provided education to the patient based review of the above and I have provided the pt with a written personalized care plan for preventive services.   She id doing well overall.   Pain off and on in left low back pain, in upper buttock.  Worse in last 3 days. No recent falls, no chagne in activity. No radiation of pain  To leg.  No numbness, no weakness.  No fever. Eases off after a few minutes.  Occ cramping in feet and legs.   Hypertension: Well controlled on metoprolol, lisinopril/HCTZ  BP Readings from Last 3 Encounters:  06/19/15 122/68  06/13/14 116/60  04/28/14 180/80  Using medication without problems or lightheadedness: none  Chest pain with exertion:None  Edema:None  Short of breath:only with extreme exertion up hills Average home BPs: well controlled at home.. 134/80s  Other issues:   Elevated Cholesterol: Well controlled at goal < 130 on no med.  Lab Results  Component Value Date   CHOL 204* 06/14/2015   HDL 33.80* 06/14/2015   LDLCALC 126* 05/05/2014   LDLDIRECT 104.0 06/14/2015   TRIG 243.0* 06/14/2015   CHOLHDL 6 06/14/2015  Diet compliance: good  Exercise: Walking  4-5 times a week. Other complaints:   Prediabetes: Blood sugar much better compared to last year. Glucose 101. Lab Results  Component Value Date   HGBA1C 6.0 06/14/2015   Review of Systems  Constitutional: Negative for  fever, fatigue and unexpected weight change.  HENT: Negative for ear pain, congestion, sore throat, sneezing, trouble swallowing and sinus pressure.  Eyes: Negative for pain and itching.  Respiratory: Negative for cough, shortness of breath when walking up hills in neighborhood and wheezing.  Cardiovascular: Negative for chest pain, palpitations and leg swelling.  Gastrointestinal: Negative for nausea, abdominal pain, diarrhea, constipation and blood in stool.  GERD, intermittantly using omeprazole  Genitourinary: Positive for frequency. Negative for dysuria, hematuria, vaginal discharge and difficulty urinating.  Skin: Negative for rash.  Neurological: Negative for syncope, weakness, light-headedness, numbness and headaches.  No recent falls  Psychiatric/Behavioral: Negative for confusion and dysphoric mood. The patient is not nervous/anxious.  Objective:   Physical Exam  Constitutional: Vital signs are normal. She appears well-developed and well-nourished. She is cooperative. Non-toxic appearance. She does not appear ill. No distress.  Overweight, elderly  HENT:  Head: Normocephalic.  Right Ear: Hearing, tympanic membrane, external normal.  Cerumen impaction in right ear.. Irrigated simply. Left Ear: Hearing, tympanic membrane, external ear and ear canal normal.  Nose: Nose normal.  Eyes: Conjunctivae, EOM and lids are normal. Pupils are equal, round, and reactive to light. No foreign bodies found.  Neck: Trachea normal and normal range of motion. Neck supple. Carotid bruit is not present. No mass and no thyromegaly present.  Cardiovascular: Normal rate, regular rhythm, S1 normal, S2 normal, normal heart sounds and intact distal pulses. Exam reveals no gallop.  No murmur heard.  Pulmonary/Chest: Effort normal and breath sounds normal. No  respiratory distress. She has no wheezes. She has no rhonchi. She has no rales.  Abdominal: Soft. Normal appearance and bowel  sounds are normal. She exhibits no distension, no fluid wave, no abdominal bruit and no mass. There is no hepatosplenomegaly. There is no tenderness. There is no rebound, no guarding and no CVA tenderness. No hernia.  Lymphadenopathy:  She has no cervical adenopathy.  She has no axillary adenopathy.  Neurological: She is alert. She has normal strength. No cranial nerve deficit or sensory deficit.  Breast exam: no skin changes, no mass Skin: Skin is warm, dry and intact. No rash noted.  Psychiatric: Her speech is normal and behavior is normal. Judgment normal. Her mood appears not anxious. Cognition and memory are normal. She does not exhibit a depressed mood.  Assessment & Plan:   The patient's preventative maintenance and recommended screening tests for an annual wellness exam were reviewed in full today.  Brought up to date unless services declined.  Counselled on the importance of diet, exercise, and its role in overall health and mortality.  The patient's FH and SH was reviewed, including their home life, tobacco status, and drug and alcohol status.   Vaccines: Uptodate with vaccines Td, PNA, prevnar, zostavax, due for flu (plans later in year.) Colon: incomplete colonoscopy 2009.Marland Kitchen Repeat prn only.  Pap/DVE not indicated.  Mammo: likes to continue, sister and 2 daughters with breast cancer. Last nml 07/2013, will plan every 2 years. Non smoker  DEXA: last in 2014 osteopenia stable , repeat in 5 years.

## 2015-06-19 NOTE — Patient Instructions (Addendum)
Use heat, massage, gentle low back stretching for catch in left backside. Can use  tyelnol for pain as needed.  Get flu shot when ready later in fall. Let us know when you get it.  Schedule mammogram as planned.

## 2015-07-03 ENCOUNTER — Other Ambulatory Visit: Payer: Self-pay

## 2015-07-03 DIAGNOSIS — Z1231 Encounter for screening mammogram for malignant neoplasm of breast: Secondary | ICD-10-CM

## 2015-07-04 DIAGNOSIS — H43813 Vitreous degeneration, bilateral: Secondary | ICD-10-CM | POA: Diagnosis not present

## 2015-07-04 DIAGNOSIS — H3532 Exudative age-related macular degeneration: Secondary | ICD-10-CM | POA: Diagnosis not present

## 2015-07-04 DIAGNOSIS — H3531 Nonexudative age-related macular degeneration: Secondary | ICD-10-CM | POA: Diagnosis not present

## 2015-08-14 ENCOUNTER — Other Ambulatory Visit: Payer: Self-pay | Admitting: Family Medicine

## 2015-08-15 DIAGNOSIS — Z23 Encounter for immunization: Secondary | ICD-10-CM | POA: Diagnosis not present

## 2015-08-16 ENCOUNTER — Encounter: Payer: Self-pay | Admitting: Internal Medicine

## 2015-10-08 DIAGNOSIS — H353124 Nonexudative age-related macular degeneration, left eye, advanced atrophic with subfoveal involvement: Secondary | ICD-10-CM | POA: Diagnosis not present

## 2015-10-12 ENCOUNTER — Ambulatory Visit
Admission: RE | Admit: 2015-10-12 | Discharge: 2015-10-12 | Disposition: A | Discharge status: Home / Self Care | Payer: Medicare Other | Source: Ambulatory Visit

## 2015-10-12 DIAGNOSIS — Z1231 Encounter for screening mammogram for malignant neoplasm of breast: Secondary | ICD-10-CM | POA: Diagnosis not present

## 2016-01-02 DIAGNOSIS — H353213 Exudative age-related macular degeneration, right eye, with inactive scar: Secondary | ICD-10-CM | POA: Diagnosis not present

## 2016-01-02 DIAGNOSIS — H35421 Microcystoid degeneration of retina, right eye: Secondary | ICD-10-CM | POA: Diagnosis not present

## 2016-01-02 DIAGNOSIS — H31091 Other chorioretinal scars, right eye: Secondary | ICD-10-CM | POA: Diagnosis not present

## 2016-01-02 DIAGNOSIS — H353124 Nonexudative age-related macular degeneration, left eye, advanced atrophic with subfoveal involvement: Secondary | ICD-10-CM | POA: Diagnosis not present

## 2016-01-10 DIAGNOSIS — H35359 Cystoid macular degeneration, unspecified eye: Secondary | ICD-10-CM | POA: Diagnosis not present

## 2016-05-22 ENCOUNTER — Telehealth: Payer: Self-pay | Admitting: Family Medicine

## 2016-05-22 NOTE — Telephone Encounter (Signed)
Pt aware of appointment 

## 2016-05-22 NOTE — Telephone Encounter (Signed)
LM for pt to sch AWV on 06/20/16 at 10:30, prior to CPE at 11:15, mn

## 2016-06-12 ENCOUNTER — Telehealth: Payer: Self-pay | Admitting: Family Medicine

## 2016-06-12 DIAGNOSIS — E785 Hyperlipidemia, unspecified: Secondary | ICD-10-CM

## 2016-06-12 DIAGNOSIS — K208 Other esophagitis without bleeding: Secondary | ICD-10-CM

## 2016-06-12 DIAGNOSIS — R7303 Prediabetes: Secondary | ICD-10-CM

## 2016-06-12 DIAGNOSIS — M8580 Other specified disorders of bone density and structure, unspecified site: Secondary | ICD-10-CM

## 2016-06-12 NOTE — Telephone Encounter (Signed)
-----   Message from Ellamae Sia sent at 06/06/2016  4:19 PM EDT ----- Regarding: Lab orders for 8.25.17 Patient is scheduled for CPX labs, please order future labs, Thanks , Karna Christmas

## 2016-06-13 ENCOUNTER — Other Ambulatory Visit (INDEPENDENT_AMBULATORY_CARE_PROVIDER_SITE_OTHER): Payer: Medicare Other

## 2016-06-13 DIAGNOSIS — K208 Other esophagitis without bleeding: Secondary | ICD-10-CM

## 2016-06-13 DIAGNOSIS — M8580 Other specified disorders of bone density and structure, unspecified site: Secondary | ICD-10-CM | POA: Diagnosis not present

## 2016-06-13 DIAGNOSIS — R7303 Prediabetes: Secondary | ICD-10-CM

## 2016-06-13 DIAGNOSIS — E785 Hyperlipidemia, unspecified: Secondary | ICD-10-CM | POA: Diagnosis not present

## 2016-06-13 LAB — CBC WITH DIFFERENTIAL/PLATELET
Basophils Absolute: 0 10*3/uL (ref 0.0–0.1)
Basophils Relative: 0.4 % (ref 0.0–3.0)
Eosinophils Absolute: 0.3 10*3/uL (ref 0.0–0.7)
Eosinophils Relative: 2.9 % (ref 0.0–5.0)
HCT: 39.2 % (ref 36.0–46.0)
Hemoglobin: 13.6 g/dL (ref 12.0–15.0)
Lymphocytes Relative: 25.1 % (ref 12.0–46.0)
Lymphs Abs: 2.3 10*3/uL (ref 0.7–4.0)
MCHC: 34.6 g/dL (ref 30.0–36.0)
MCV: 89.9 fl (ref 78.0–100.0)
Monocytes Absolute: 0.7 10*3/uL (ref 0.1–1.0)
Monocytes Relative: 8 % (ref 3.0–12.0)
Neutro Abs: 5.9 10*3/uL (ref 1.4–7.7)
Neutrophils Relative %: 63.6 % (ref 43.0–77.0)
Platelets: 235 10*3/uL (ref 150.0–400.0)
RBC: 4.36 Mil/uL (ref 3.87–5.11)
RDW: 13.6 % (ref 11.5–15.5)
WBC: 9.3 10*3/uL (ref 4.0–10.5)

## 2016-06-13 LAB — COMPREHENSIVE METABOLIC PANEL
ALT: 9 U/L (ref 0–35)
AST: 14 U/L (ref 0–37)
Albumin: 3.9 g/dL (ref 3.5–5.2)
Alkaline Phosphatase: 52 U/L (ref 39–117)
BUN: 33 mg/dL — ABNORMAL HIGH (ref 6–23)
CO2: 28 mEq/L (ref 19–32)
Calcium: 9.6 mg/dL (ref 8.4–10.5)
Chloride: 101 mEq/L (ref 96–112)
Creatinine, Ser: 1.36 mg/dL — ABNORMAL HIGH (ref 0.40–1.20)
GFR: 38.94 mL/min — ABNORMAL LOW (ref >60.00)
Glucose, Bld: 111 mg/dL — ABNORMAL HIGH (ref 70–99)
Potassium: 4.3 mEq/L (ref 3.5–5.1)
Sodium: 136 mEq/L (ref 135–145)
Total Bilirubin: 0.4 mg/dL (ref 0.2–1.2)
Total Protein: 7.2 g/dL (ref 6.0–8.3)

## 2016-06-13 LAB — LIPID PANEL
Cholesterol: 217 mg/dL — ABNORMAL HIGH (ref 0–200)
HDL: 32.9 mg/dL — ABNORMAL LOW (ref >39.00)
NonHDL: 184.43
Total CHOL/HDL Ratio: 7
Triglycerides: 312 mg/dL — ABNORMAL HIGH (ref 0.0–149.0)
VLDL: 62.4 mg/dL — ABNORMAL HIGH (ref 0.0–40.0)

## 2016-06-13 LAB — VITAMIN D 25 HYDROXY (VIT D DEFICIENCY, FRACTURES): VITD: 35.5 ng/mL (ref 30.00–100.00)

## 2016-06-13 LAB — HEMOGLOBIN A1C: Hgb A1c MFr Bld: 6.2 % (ref 4.6–6.5)

## 2016-06-13 LAB — LDL CHOLESTEROL, DIRECT: Direct LDL: 112 mg/dL

## 2016-06-16 ENCOUNTER — Encounter: Payer: Self-pay | Admitting: Podiatry

## 2016-06-16 ENCOUNTER — Ambulatory Visit (INDEPENDENT_AMBULATORY_CARE_PROVIDER_SITE_OTHER): Payer: Medicare Other | Admitting: Podiatry

## 2016-06-16 ENCOUNTER — Ambulatory Visit (INDEPENDENT_AMBULATORY_CARE_PROVIDER_SITE_OTHER): Payer: Medicare Other

## 2016-06-16 DIAGNOSIS — M722 Plantar fascial fibromatosis: Secondary | ICD-10-CM

## 2016-06-16 DIAGNOSIS — M779 Enthesopathy, unspecified: Secondary | ICD-10-CM

## 2016-06-16 DIAGNOSIS — L603 Nail dystrophy: Secondary | ICD-10-CM | POA: Diagnosis not present

## 2016-06-16 DIAGNOSIS — M7752 Other enthesopathy of left foot: Secondary | ICD-10-CM

## 2016-06-16 DIAGNOSIS — B351 Tinea unguium: Secondary | ICD-10-CM | POA: Diagnosis not present

## 2016-06-16 DIAGNOSIS — M79676 Pain in unspecified toe(s): Secondary | ICD-10-CM

## 2016-06-16 DIAGNOSIS — M778 Other enthesopathies, not elsewhere classified: Secondary | ICD-10-CM

## 2016-06-16 NOTE — Progress Notes (Signed)
   Subjective:    Patient ID: [name], female    DOB: 1925/01/06, 82 y.o.   MRN: [id number]  HPI: She presents today with a chief complaint of painful left foot. She states that the majority of the pain is located around the fifth metatarsophalangeal joint area of the left foot. Denies any trauma. She states that she likes to walk but has pain in her midfoot and rear foot. She also states that her toenails are thick and painful and she can no longer cut them.    Review of Systems  All other systems reviewed and are negative.      Objective:   Physical Exam: Vital signs are stable alert and oriented 3 pulses are strongly palpable. Neurologic sensorium is intact. Deep tendon reflexes are brisk and equal bilateral. Orthopedic evaluation shows all joints distal to the ankle full range of motion without crepitation. Cutaneous evaluation x-rays supple well-hydrated cutis. She has moderate edema to the lateral aspect of the left foot. No pain on palpation of the metatarsals that she does have pain on palpation and in range of motion of the subtalar joint of the left foot. He also has pain on palpation medial calcaneal tubercle of the left foot. 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 Soft tissue increase in density at the plantar fascia calcaneal insertion site of the left foot. Cutaneous evaluation does not demonstrate any type of soft tissue lesions no open wounds.        Assessment & Plan:  Plantar fasciitis left foot. Sinus tarsitis left foot. Pain in limb secondary to onychomycosis 1 through 5 bilateral.  Plan: Debridement of toenails 1 through 5 bilateral. Injected the subtalar joint today with Kenalog and local anesthetic and injected the left heel today with Kenalog and local anesthetic. Follow-up with her in one month discussed appropriate shoe gear stretching exercises ice therapy and shoe gear modifications.

## 2016-06-20 ENCOUNTER — Ambulatory Visit (INDEPENDENT_AMBULATORY_CARE_PROVIDER_SITE_OTHER): Payer: Medicare Other | Admitting: Family Medicine

## 2016-06-20 ENCOUNTER — Encounter: Payer: Self-pay | Admitting: Family Medicine

## 2016-06-20 ENCOUNTER — Ambulatory Visit: Payer: Medicare Other

## 2016-06-20 VITALS — BP 143/73 | HR 61 | Temp 97.8°F | Ht 64.0 in | Wt 157.0 lb

## 2016-06-20 DIAGNOSIS — N183 Chronic kidney disease, stage 3 unspecified: Secondary | ICD-10-CM

## 2016-06-20 DIAGNOSIS — E785 Hyperlipidemia, unspecified: Secondary | ICD-10-CM | POA: Diagnosis not present

## 2016-06-20 DIAGNOSIS — R7303 Prediabetes: Secondary | ICD-10-CM

## 2016-06-20 DIAGNOSIS — Z0000 Encounter for general adult medical examination without abnormal findings: Secondary | ICD-10-CM | POA: Diagnosis not present

## 2016-06-20 DIAGNOSIS — I10 Essential (primary) hypertension: Secondary | ICD-10-CM

## 2016-06-20 NOTE — Assessment & Plan Note (Addendum)
Well controlled. Continue current medication. On ACEI  

## 2016-06-20 NOTE — Assessment & Plan Note (Signed)
Stable control on no med. Encouraged exercise, weight loss, healthy eating habits.  

## 2016-06-20 NOTE — Assessment & Plan Note (Signed)
Acceptable control at current age on no medication.

## 2016-06-20 NOTE — Assessment & Plan Note (Signed)
?   Acute change, increase fluids. ON ACEI. Will re-eval in 1 week after increasing fluids.

## 2016-06-20 NOTE — Progress Notes (Signed)
Pre visit review using our clinic review tool, if applicable. No additional management support is needed unless otherwise documented below in the visit note. 

## 2016-06-20 NOTE — Progress Notes (Signed)
HPI  I have personally reviewed the Medicare Annual Wellness questionnaire and have noted  1. The patient's medical and social history  2. Their use of alcohol, tobacco or illicit drugs  3. Their current medications and supplements  4. The patient's functional ability including ADL's, fall risks, home safety risks and hearing or visual  impairment.  5. Diet and physical activities  6. Evidence for depression or mood disorders  The patients weight, height, BMI and visual acuity have been recorded in the chart  I have made referrals, counseling and provided education to the patient based review of the above and I have provided the pt with a written personalized care plan for preventive services.   She is doing well overall except she has been seeing podiatry for plantar fasciitis, toe nail cutting.. Treated with steroid injection  In subtalar joint.  Dr. Milinda Pointer.   Has follow up in 1 month.  Hypertension: Well controlled at home on metoprolol, lisinopril/HCTZ  BP Readings from Last 3 Encounters:  06/20/16 (!) 143/73  06/19/15 122/68  06/13/14 116/60  Using medication without problems or lightheadedness: none  Chest pain with exertion:None  Edema:None  Short of breath:only with extreme exertion up hills Average home BPs: well controlled at home.. 130/80s  Other issues:   Elevated Cholesterol: Well controlled at goal < 130 on no med.  trigs elevated. Lab Results  Component Value Date   CHOL 217 (H) 06/13/2016   HDL 32.90 (L) 06/13/2016   LDLCALC 126 (H) 05/05/2014   LDLDIRECT 112.0 06/13/2016   TRIG 312.0 (H) 06/13/2016   CHOLHDL 7 06/13/2016  Diet compliance: good  Exercise: Walking  4 times a week. Other complaints:   Prediabetes: Blood sugar stable Lab Results  Component Value Date   HGBA1C 6.2 06/13/2016   She has had some decrease in kidney function compared to last year.  GFR 49-52 now down to 38.  She is using tramadol and hydrocodone for pain.  Has not used anything in last few days. Has not used ibuprofen. She is not drinking much water.  Social History /Family History/Past Medical History reviewed and updated if needed.  Review of Systems  Constitutional: Negative for fever, fatigue and unexpected weight change.  HENT: Negative for ear pain, congestion, sore throat, sneezing, trouble swallowing and sinus pressure.  Eyes: Negative for pain and itching.  Respiratory: Negative for cough, shortness of breath when walking up hills in neighborhood and wheezing.  Cardiovascular: Negative for chest pain, palpitations and leg swelling.  Gastrointestinal: Negative for nausea, abdominal pain, diarrhea, constipation and blood in stool.  GERD, intermittantly using omeprazole  Genitourinary: Positive for frequency. Negative for dysuria, hematuria, vaginal discharge and difficulty urinating.  Skin: Negative for rash.  Neurological: Negative for syncope, weakness, light-headedness, numbness and headaches.  No recent falls  Psychiatric/Behavioral: Negative for confusion and dysphoric mood. The patient is not nervous/anxious.  Objective:   Physical Exam  Constitutional: Vital signs are normal. She appears well-developed and well-nourished. She is cooperative. Non-toxic appearance. She does not appear ill. No distress.  Overweight, elderly  HENT:  Head: Normocephalic.   Bilateral ear: cerumen impaction.. Simple flush Nose: Nose normal.  Eyes: Conjunctivae, EOM and lids are normal. Pupils are equal, round, and reactive to light. No foreign bodies found.  Neck: Trachea normal and normal range of motion. Neck supple. Carotid bruit is not present. No mass and no thyromegaly present.  Cardiovascular: Normal rate, regular rhythm, S1 normal, S2 normal, normal heart sounds and intact distal pulses. Exam  reveals no gallop.  No murmur heard.  Pulmonary/Chest: Effort normal and breath sounds normal. No respiratory distress. She has  no wheezes. She has no rhonchi. She has no rales.  Abdominal: Soft. Normal appearance and bowel sounds are normal. She exhibits no distension, no fluid wave, no abdominal bruit and no mass. There is no hepatosplenomegaly. There is no tenderness. There is no rebound, no guarding and no CVA tenderness. No hernia.  Lymphadenopathy:  She has no cervical adenopathy.  She has no axillary adenopathy.  Neurological: She is alert. She has normal strength. No cranial nerve deficit or sensory deficit.  Breast exam: no skin changes, no mass Skin: Skin is warm, dry and intact. No rash noted.  Psychiatric: Her speech is normal and behavior is normal. Judgment normal. Her mood appears not anxious. Cognition and memory are normal. She does not exhibit a depressed mood.  Assessment & Plan:   The patient's preventative maintenance and recommended screening tests for an annual wellness exam were reviewed in full today.  Brought up to date unless services declined.  Counselled on the importance of diet, exercise, and its role in overall health and mortality.  The patient's FH and SH was reviewed, including their home life, tobacco status, and drug and alcohol status.   Vaccines: Uptodate with vaccines Td, PNA, prevnar, zostavax, due for flu (plans later in year.) Colon: incomplete colonoscopy 2009.Marland Kitchen Not indicated given age 82. Pap/DVE not indicated given age 82: likes to continue, sister and 2 daughters with breast cancer. Last nml 09/2015, will plan every 2 years. Non smoker  DEXA: last in 2014 osteopenia stable , repeat in 5 years.

## 2016-06-20 NOTE — Patient Instructions (Addendum)
Follow blood pressure at home.. Call if it is consistently > 140/90 or higher.  Likely up some now due to the pain in feet.  keep up with walking daily or several times a week  Increase fluids as able to help kidney function.

## 2016-06-27 ENCOUNTER — Other Ambulatory Visit (INDEPENDENT_AMBULATORY_CARE_PROVIDER_SITE_OTHER): Payer: Medicare Other

## 2016-06-27 DIAGNOSIS — N183 Chronic kidney disease, stage 3 unspecified: Secondary | ICD-10-CM

## 2016-06-27 LAB — BASIC METABOLIC PANEL
BUN: 33 mg/dL — ABNORMAL HIGH (ref 6–23)
CO2: 29 mEq/L (ref 19–32)
Calcium: 9.8 mg/dL (ref 8.4–10.5)
Chloride: 94 mEq/L — ABNORMAL LOW (ref 96–112)
Creatinine, Ser: 1.02 mg/dL (ref 0.40–1.20)
GFR: 54.27 mL/min — ABNORMAL LOW (ref >60.00)
Glucose, Bld: 103 mg/dL — ABNORMAL HIGH (ref 70–99)
Potassium: 4.3 mEq/L (ref 3.5–5.1)
Sodium: 127 mEq/L — ABNORMAL LOW (ref 135–145)

## 2016-07-01 ENCOUNTER — Other Ambulatory Visit: Payer: Self-pay | Admitting: Family Medicine

## 2016-07-01 DIAGNOSIS — H35421 Microcystoid degeneration of retina, right eye: Secondary | ICD-10-CM | POA: Diagnosis not present

## 2016-07-01 DIAGNOSIS — H353213 Exudative age-related macular degeneration, right eye, with inactive scar: Secondary | ICD-10-CM | POA: Diagnosis not present

## 2016-07-01 DIAGNOSIS — H353124 Nonexudative age-related macular degeneration, left eye, advanced atrophic with subfoveal involvement: Secondary | ICD-10-CM | POA: Diagnosis not present

## 2016-07-01 DIAGNOSIS — H53412 Scotoma involving central area, left eye: Secondary | ICD-10-CM | POA: Diagnosis not present

## 2016-07-01 DIAGNOSIS — E871 Hypo-osmolality and hyponatremia: Secondary | ICD-10-CM

## 2016-07-04 ENCOUNTER — Other Ambulatory Visit (INDEPENDENT_AMBULATORY_CARE_PROVIDER_SITE_OTHER): Payer: Medicare Other

## 2016-07-04 DIAGNOSIS — E871 Hypo-osmolality and hyponatremia: Secondary | ICD-10-CM

## 2016-07-04 LAB — BASIC METABOLIC PANEL
BUN: 31 mg/dL — ABNORMAL HIGH (ref 6–23)
CO2: 26 mEq/L (ref 19–32)
Calcium: 9.5 mg/dL (ref 8.4–10.5)
Chloride: 101 mEq/L (ref 96–112)
Creatinine, Ser: 1.08 mg/dL (ref 0.40–1.20)
GFR: 50.8 mL/min — ABNORMAL LOW (ref >60.00)
Glucose, Bld: 100 mg/dL — ABNORMAL HIGH (ref 70–99)
Potassium: 4.5 mEq/L (ref 3.5–5.1)
Sodium: 134 mEq/L — ABNORMAL LOW (ref 135–145)

## 2016-07-07 ENCOUNTER — Encounter: Payer: Self-pay | Admitting: *Deleted

## 2016-07-23 ENCOUNTER — Ambulatory Visit (INDEPENDENT_AMBULATORY_CARE_PROVIDER_SITE_OTHER): Payer: Medicare Other | Admitting: Podiatry

## 2016-07-23 ENCOUNTER — Encounter: Payer: Self-pay | Admitting: Podiatry

## 2016-07-23 DIAGNOSIS — M25572 Pain in left ankle and joints of left foot: Secondary | ICD-10-CM | POA: Diagnosis not present

## 2016-07-23 DIAGNOSIS — Z23 Encounter for immunization: Secondary | ICD-10-CM | POA: Diagnosis not present

## 2016-07-23 DIAGNOSIS — M722 Plantar fascial fibromatosis: Secondary | ICD-10-CM

## 2016-07-23 NOTE — Progress Notes (Signed)
She presents today with her daughter for follow-up of pain along the lateral aspect of the left foot. She states this seems to be painful here and here if she points to the left heel and the left sinus tarsi area. She is also having pain along the lateral aspect the fifth metatarsal left foot. She denies any further trauma. States that he feels significantly improved from last visit per week performed injections.  Objective: Vital signs are stable she is alert and oriented 3 much decrease in pain on palpation sinus tarsi and plantar fascia insertion site left heel.  Assessment: Well-healed plantar fasciitis subtalar joint capsulitis.  Plan: I reinjected these areas today with Kenalog and local anesthetic plantar fascia left and the sinus tarsi left. I recommend she continue all conservative therapies and I will follow-up with her in 1 month area

## 2016-09-08 ENCOUNTER — Ambulatory Visit: Payer: Medicare Other | Admitting: Podiatry

## 2016-09-10 ENCOUNTER — Ambulatory Visit (INDEPENDENT_AMBULATORY_CARE_PROVIDER_SITE_OTHER): Payer: Medicare Other | Admitting: Podiatry

## 2016-09-10 ENCOUNTER — Ambulatory Visit (INDEPENDENT_AMBULATORY_CARE_PROVIDER_SITE_OTHER): Payer: Medicare Other

## 2016-09-10 ENCOUNTER — Encounter: Payer: Self-pay | Admitting: Podiatry

## 2016-09-10 VITALS — BP 127/63 | HR 71 | Temp 98.3°F | Resp 18

## 2016-09-10 DIAGNOSIS — R52 Pain, unspecified: Secondary | ICD-10-CM | POA: Diagnosis not present

## 2016-09-10 DIAGNOSIS — M722 Plantar fascial fibromatosis: Secondary | ICD-10-CM

## 2016-09-10 DIAGNOSIS — M1000 Idiopathic gout, unspecified site: Secondary | ICD-10-CM

## 2016-09-10 LAB — CBC WITH DIFFERENTIAL/PLATELET
Basophils Absolute: 0 cells/uL (ref 0–200)
Basophils Relative: 0 %
Eosinophils Absolute: 0 cells/uL — ABNORMAL LOW (ref 15–500)
Eosinophils Relative: 0 %
HCT: 40.2 % (ref 35.0–45.0)
Hemoglobin: 13 g/dL (ref 11.7–15.5)
Lymphocytes Relative: 19 %
Lymphs Abs: 1976 cells/uL (ref 850–3900)
MCH: 30.7 pg (ref 27.0–33.0)
MCHC: 32.3 g/dL (ref 32.0–36.0)
MCV: 95 fL (ref 80.0–100.0)
MPV: 10.5 fL (ref 7.5–12.5)
Monocytes Absolute: 936 cells/uL (ref 200–950)
Monocytes Relative: 9 %
Neutro Abs: 7488 cells/uL (ref 1500–7800)
Neutrophils Relative %: 72 %
Platelets: 269 10*3/uL (ref 140–400)
RBC: 4.23 MIL/uL (ref 3.80–5.10)
RDW: 14 % (ref 11.0–15.0)
WBC: 10.4 10*3/uL (ref 3.8–10.8)

## 2016-09-10 MED ORDER — OXYCODONE-ACETAMINOPHEN 5-325 MG PO TABS: 1.0000 | ORAL_TABLET | Freq: Four times a day (QID) | ORAL | 0 refills | Status: DC | PRN

## 2016-09-10 MED ORDER — PREDNISONE 10 MG (21) PO TBPK: ORAL_TABLET | ORAL | 0 refills | Status: DC

## 2016-09-10 NOTE — Patient Instructions (Addendum)
Present to lab for CBC, sedimentation rate, uric acid Lab to be performed at lab core in [place] After your blood is drawn begin taking prednisone 10 mg as prescribed Take pain medicine as needed every 6 hours

## 2016-09-10 NOTE — Progress Notes (Signed)
   Subjective:    Patient ID: [name], female    DOB: 04-13-1925, 82 y.o.   MRN: [id number]  HPI   This patient presents today complaining of a acute onset of pain in the right ankle in the past 24 hours. She noticed a sudden increase in pain, swelling, redness, warmth in the right ankle. She denies any history of direct injury. She denies any other joint pain. She had an existing tramadol from joint replacement 2014 and took 1 tramadol without relief pain. The patient's 2 daughters are present in the treatment room today  Review of Systems  All other systems reviewed and are negative.      Objective:   Physical Exam  Orientated 3 BP 127/63 Pulse 71 Respiration 18 Temperature 98.3 Fahrenheit  Vascular: No calf edema or calf tenderness bilaterally DP pulses 1/4 bilaterally PT pulses 1/4 bilaterally Capillary reflex immediate bilaterally  Neurological: Sensation to 10 g monofilament wear intact 5/5 bilaterally Vibratory sensation nonreactive bilaterally Ankle reflex equal and reactive bilaterally  Dermatological: No open skin lesions bilaterally The right ankle is erythematous, edematous, warm   Musculoskeletal: Palpable tenderness anterior medial lateral right ankle without any palpable lesions Right ankle tender on range of motion without crepitus    X-ray weightbearing right ankle dated 09/10/2016  Intact bony structure without fracture or dislocation Ankle mortise appears adequate Increased soft tissue density without gas formation  No acute bony abnormality noted in the weightbearing x-ray of the right ankle dated 09/10/2016   X-ray examination weightbearing right foot dated 09/10/2016  Intact bony structure without a fracture and/or dislocation Increased soft tissue density No emphysema  Radiographic impression: No acute bony abnormality and a weightbearing x-rays right foot dated 09/10/2016       Assessment & Plan:    Assessment: Suspect gouty arthritis right ankle  Plan: Patient referred to lab for CBC with differential,  sedimentation rate and uric acid Rx 10 mg prednisone tapering Dosepak 6 days Rx hydrocodone 5 mg #8 take 1 every 6 hours when necessary pain Patient instructed to present to our [place] office in 7 days. If she does not start responding to the prednisone or notice any sudden increase in pain, swelling, redness to present to the emergency department otherwise reappoint 7 days in our [place] office

## 2016-09-11 LAB — URIC ACID: Uric Acid, Serum: 7.4 mg/dL — ABNORMAL HIGH (ref 2.5–7.0)

## 2016-09-11 LAB — SEDIMENTATION RATE: Sed Rate: 30 mm/hr (ref 0–30)

## 2016-09-15 ENCOUNTER — Other Ambulatory Visit: Payer: Self-pay | Admitting: Family Medicine

## 2016-09-15 DIAGNOSIS — Z1231 Encounter for screening mammogram for malignant neoplasm of breast: Secondary | ICD-10-CM

## 2016-09-17 ENCOUNTER — Ambulatory Visit (INDEPENDENT_AMBULATORY_CARE_PROVIDER_SITE_OTHER): Payer: Medicare Other | Admitting: Podiatry

## 2016-09-17 DIAGNOSIS — M1000 Idiopathic gout, unspecified site: Secondary | ICD-10-CM | POA: Diagnosis not present

## 2016-09-17 NOTE — Progress Notes (Signed)
She presents today 1 week after being diagnosed with gout to her right ankle. She states that the medication Dr. Clydia Llano provided made her feel much better.  Objective: No redness no pain no erythema right ankle. Uric acid was slightly elevated and a 7.4.  Resolved gouty arthritis right foot.  Plan: Notify us with any flareups

## 2016-10-18 ENCOUNTER — Other Ambulatory Visit: Payer: Self-pay | Admitting: Family Medicine

## 2016-10-21 DIAGNOSIS — H353124 Nonexudative age-related macular degeneration, left eye, advanced atrophic with subfoveal involvement: Secondary | ICD-10-CM | POA: Diagnosis not present

## 2016-10-21 DIAGNOSIS — H53412 Scotoma involving central area, left eye: Secondary | ICD-10-CM | POA: Diagnosis not present

## 2016-10-21 DIAGNOSIS — H35421 Microcystoid degeneration of retina, right eye: Secondary | ICD-10-CM | POA: Diagnosis not present

## 2016-10-28 ENCOUNTER — Ambulatory Visit
Admission: RE | Admit: 2016-10-28 | Discharge: 2016-10-28 | Disposition: A | Discharge status: Home / Self Care | Payer: Medicare Other | Source: Ambulatory Visit | Attending: Family Medicine | Admitting: Family Medicine

## 2016-10-28 DIAGNOSIS — Z1231 Encounter for screening mammogram for malignant neoplasm of breast: Secondary | ICD-10-CM

## 2016-10-30 DIAGNOSIS — H353124 Nonexudative age-related macular degeneration, left eye, advanced atrophic with subfoveal involvement: Secondary | ICD-10-CM | POA: Diagnosis not present

## 2016-10-30 DIAGNOSIS — H53412 Scotoma involving central area, left eye: Secondary | ICD-10-CM | POA: Diagnosis not present

## 2016-10-30 DIAGNOSIS — H35421 Microcystoid degeneration of retina, right eye: Secondary | ICD-10-CM | POA: Diagnosis not present

## 2016-11-26 DIAGNOSIS — H6123 Impacted cerumen, bilateral: Secondary | ICD-10-CM | POA: Diagnosis not present

## 2016-12-31 DIAGNOSIS — H353124 Nonexudative age-related macular degeneration, left eye, advanced atrophic with subfoveal involvement: Secondary | ICD-10-CM | POA: Diagnosis not present

## 2016-12-31 DIAGNOSIS — H43813 Vitreous degeneration, bilateral: Secondary | ICD-10-CM | POA: Diagnosis not present

## 2016-12-31 DIAGNOSIS — H35413 Lattice degeneration of retina, bilateral: Secondary | ICD-10-CM | POA: Diagnosis not present

## 2016-12-31 DIAGNOSIS — H353213 Exudative age-related macular degeneration, right eye, with inactive scar: Secondary | ICD-10-CM | POA: Diagnosis not present

## 2017-02-25 ENCOUNTER — Ambulatory Visit (INDEPENDENT_AMBULATORY_CARE_PROVIDER_SITE_OTHER): Payer: Medicare Other | Admitting: Podiatry

## 2017-02-25 ENCOUNTER — Ambulatory Visit (INDEPENDENT_AMBULATORY_CARE_PROVIDER_SITE_OTHER): Payer: Medicare Other

## 2017-02-25 ENCOUNTER — Encounter: Payer: Self-pay | Admitting: Podiatry

## 2017-02-25 DIAGNOSIS — M778 Other enthesopathies, not elsewhere classified: Secondary | ICD-10-CM

## 2017-02-25 DIAGNOSIS — M1000 Idiopathic gout, unspecified site: Secondary | ICD-10-CM

## 2017-02-25 DIAGNOSIS — M7751 Other enthesopathy of right foot: Secondary | ICD-10-CM | POA: Diagnosis not present

## 2017-02-25 DIAGNOSIS — M779 Enthesopathy, unspecified: Secondary | ICD-10-CM

## 2017-02-25 MED ORDER — METHYLPREDNISOLONE 4 MG PO TBPK: ORAL_TABLET | ORAL | 0 refills | Status: DC

## 2017-02-25 NOTE — Progress Notes (Signed)
She presents today chief complaint of pain to the lateral ankle right. She states is then read for about 3 days that she states she woke up with it sore like this she does relate a history of gout in the past but usually is in the top of the foot or in the big toe.  Objective: Vital signs are stable she is alert and oriented 3. Pulses are palpable. No open lesions or wounds the right foot is swollen red hot and painful overlying the sinus tarsi and lateral ankle. Radiographs do not demonstrate any type of osseus abnormalities in this area.  Assessment: Where looking at gouty capsulitis most likely a see no signs of infection.  Plan: Injected the sinus tarsi today with Kenalog and local anesthetic started her on a Medrol Dosepak and I will follow-up with her in a couple of weeks to make sure that she is going to heal well. May need to consider colchicine for the future.

## 2017-03-23 ENCOUNTER — Ambulatory Visit: Payer: Medicare Other | Admitting: Podiatry

## 2017-03-26 DIAGNOSIS — H6123 Impacted cerumen, bilateral: Secondary | ICD-10-CM | POA: Diagnosis not present

## 2017-03-26 DIAGNOSIS — H60333 Swimmer's ear, bilateral: Secondary | ICD-10-CM | POA: Diagnosis not present

## 2017-04-03 ENCOUNTER — Ambulatory Visit (INDEPENDENT_AMBULATORY_CARE_PROVIDER_SITE_OTHER): Payer: Medicare Other | Admitting: Family Medicine

## 2017-04-03 ENCOUNTER — Encounter: Payer: Self-pay | Admitting: Family Medicine

## 2017-04-03 DIAGNOSIS — R0600 Dyspnea, unspecified: Secondary | ICD-10-CM | POA: Insufficient documentation

## 2017-04-03 DIAGNOSIS — F320 Major depressive disorder, single episode, mild: Secondary | ICD-10-CM

## 2017-04-03 DIAGNOSIS — I10 Essential (primary) hypertension: Secondary | ICD-10-CM

## 2017-04-03 DIAGNOSIS — L989 Disorder of the skin and subcutaneous tissue, unspecified: Secondary | ICD-10-CM

## 2017-04-03 DIAGNOSIS — R0609 Other forms of dyspnea: Secondary | ICD-10-CM | POA: Diagnosis not present

## 2017-04-03 MED ORDER — TRIAMCINOLONE ACETONIDE 0.5 % EX CREA: 1.0000 "application " | TOPICAL_CREAM | Freq: Two times a day (BID) | CUTANEOUS | 0 refills | Status: DC

## 2017-04-03 MED ORDER — SERTRALINE HCL 25 MG PO TABS: 25.0000 mg | ORAL_TABLET | Freq: Every day | ORAL | 3 refills | Status: DC

## 2017-04-03 NOTE — Assessment & Plan Note (Signed)
Start low dose sertraline 25 mg daily at bedtime. Follow up in 4 weeks.

## 2017-04-03 NOTE — Assessment & Plan Note (Signed)
Well controlled. Continue current medication.  

## 2017-04-03 NOTE — Assessment & Plan Note (Signed)
Appears like area off inflmmation or eczema.. Possibly AK.  Treat with topical steroid , if not improving consider bx or referral to derm.

## 2017-04-03 NOTE — Progress Notes (Signed)
   Subjective:    Patient ID: [name], female    DOB: Dec 12, 1924, 83 y.o.   MRN: [id number]  HPI    83 year old female  With history of CKD and HTN presents with multiple issues.   1. Shortness of breath with exertion (vacuuming, weeding, walking up hill). None at rest.  No CP, no dizziness. No peripheral edema  Has noted in last 2-3 months.  No cough, no wheeze  on ASA.  2. Depression,  major: Ongoing x 3 months.. worsened with needing more help and trouble seeing with new eye issue.  Tearful,  Can't drive.leeps good at night. Feeling hurt more.  PHQ9: 8   3. Skin lesion, unchanging  Presents for 1 year.  Not going away. NO pain, itches some.    Blood pressure (!) 147/62, pulse (!) 59, temperature 97.8 F (36.6 C), temperature source Oral, height 5\' 4"  (1.626 m), weight 155 lb 8 oz (70.5 kg).    Review of Systems  Constitutional: Negative for fatigue and fever.  HENT: Negative for ear pain.   Eyes: Negative for pain.  Respiratory: Positive for shortness of breath. Negative for cough, chest tightness and wheezing.   Cardiovascular: Negative for chest pain, palpitations and leg swelling.  Gastrointestinal: Negative for abdominal pain.  Genitourinary: Negative for dysuria.       Objective:   Physical Exam  Constitutional: Vital signs are normal. She appears well-developed and well-nourished. She is cooperative.  Non-toxic appearance. She does not appear ill. No distress.  Elderly female in NAD  HENT:  Head: Normocephalic.  Right Ear: Hearing, tympanic membrane, external ear and ear canal normal. Tympanic membrane is not erythematous, not retracted and not bulging.  Left Ear: Hearing, tympanic membrane, external ear and ear canal normal. Tympanic membrane is not erythematous, not retracted and not bulging.  Nose: No mucosal edema or rhinorrhea. Right sinus exhibits no maxillary sinus tenderness and no frontal sinus tenderness. Left sinus exhibits no maxillary sinus  tenderness and no frontal sinus tenderness.  Mouth/Throat: Uvula is midline, oropharynx is clear and moist and mucous membranes are normal.  Eyes: Conjunctivae, EOM and lids are normal. Pupils are equal, round, and reactive to light. Lids are everted and swept, no foreign bodies found.  Neck: Trachea normal and normal range of motion. Neck supple. Carotid bruit is not present. No thyroid mass and no thyromegaly present.  Cardiovascular: Normal rate, regular rhythm, S1 normal, S2 normal, normal heart sounds, intact distal pulses and normal pulses.  Exam reveals no gallop and no friction rub.   No murmur heard. Pulmonary/Chest: Effort normal and breath sounds normal. No tachypnea. No respiratory distress. She has no decreased breath sounds. She has no wheezes. She has no rhonchi. She has no rales.  Abdominal: Soft. Normal appearance and bowel sounds are normal. There is no tenderness.  Neurological: She is alert.  Skin: Skin is warm, dry and intact. No rash noted.  1 cm round uniform dry flaky patch on left lower ext  Psychiatric: Her speech is normal and behavior is normal. Judgment and thought content normal. Her mood appears not anxious. Cognition and memory are normal. She does not exhibit a depressed mood.          Assessment & Plan:  EKG: normal EKG, except poor r wave progression, no ST changes, no Q normal sinus rhythm, unchanged from previous tracings.

## 2017-04-03 NOTE — Patient Instructions (Addendum)
Start low dose sertraline 25 mg daily at bedtime.  Start topical steroid cream twice daily on leg spot  2 weeks.  Return for labs as soon as able.  Go to ER if severe chest pain.

## 2017-04-03 NOTE — Assessment & Plan Note (Signed)
Eval with EKG and labs to assess heart, thyroid, r/o anemia.  no clear pulmonary issue per lung exam and symptoms.  nonsmoker. May need ECHO or further eval of heart.

## 2017-04-06 ENCOUNTER — Telehealth: Payer: Self-pay | Admitting: Family Medicine

## 2017-04-06 NOTE — Telephone Encounter (Signed)
-----   Message from Ellamae Sia sent at 04/06/2017 12:08 PM EDT ----- Regarding: Lab orders for Tuesday, 6.19.18 Lab orders , no f/u appt

## 2017-04-07 ENCOUNTER — Other Ambulatory Visit (INDEPENDENT_AMBULATORY_CARE_PROVIDER_SITE_OTHER): Payer: Medicare Other

## 2017-04-07 DIAGNOSIS — R069 Unspecified abnormalities of breathing: Secondary | ICD-10-CM

## 2017-04-07 DIAGNOSIS — I10 Essential (primary) hypertension: Secondary | ICD-10-CM

## 2017-04-07 LAB — T4, FREE: Free T4: 0.73 ng/dL (ref 0.60–1.60)

## 2017-04-07 LAB — T3, FREE: T3, Free: 3.7 pg/mL (ref 2.3–4.2)

## 2017-04-07 LAB — CBC WITH DIFFERENTIAL/PLATELET
Basophils Absolute: 0.1 10*3/uL (ref 0.0–0.1)
Basophils Relative: 0.7 % (ref 0.0–3.0)
Eosinophils Absolute: 1.4 10*3/uL — ABNORMAL HIGH (ref 0.0–0.7)
Eosinophils Relative: 16.2 % — ABNORMAL HIGH (ref 0.0–5.0)
HCT: 39 % (ref 36.0–46.0)
Hemoglobin: 13.2 g/dL (ref 12.0–15.0)
Lymphocytes Relative: 29.9 % (ref 12.0–46.0)
Lymphs Abs: 2.5 10*3/uL (ref 0.7–4.0)
MCHC: 33.9 g/dL (ref 30.0–36.0)
MCV: 90.5 fl (ref 78.0–100.0)
Monocytes Absolute: 0.8 10*3/uL (ref 0.1–1.0)
Monocytes Relative: 9.4 % (ref 3.0–12.0)
Neutro Abs: 3.7 10*3/uL (ref 1.4–7.7)
Neutrophils Relative %: 43.8 % (ref 43.0–77.0)
Platelets: 236 10*3/uL (ref 150.0–400.0)
RBC: 4.31 Mil/uL (ref 3.87–5.11)
RDW: 14.7 % (ref 11.5–15.5)
WBC: 8.4 10*3/uL (ref 4.0–10.5)

## 2017-04-07 LAB — BRAIN NATRIURETIC PEPTIDE: Pro B Natriuretic peptide (BNP): 98 pg/mL (ref 0.0–100.0)

## 2017-04-07 LAB — TSH: TSH: 7.41 u[IU]/mL — ABNORMAL HIGH (ref 0.35–4.50)

## 2017-04-09 ENCOUNTER — Telehealth: Payer: Self-pay | Admitting: Family Medicine

## 2017-04-09 NOTE — Telephone Encounter (Signed)
See Lab Result Note from 04/07/2017.

## 2017-04-09 NOTE — Telephone Encounter (Signed)
Pt returned shannon's call about her labs from dr Diona Browner

## 2017-04-12 ENCOUNTER — Telehealth: Payer: Self-pay | Admitting: Family Medicine

## 2017-04-12 DIAGNOSIS — R0609 Other forms of dyspnea: Principal | ICD-10-CM

## 2017-04-12 DIAGNOSIS — R0600 Dyspnea, unspecified: Secondary | ICD-10-CM

## 2017-04-12 NOTE — Telephone Encounter (Signed)
-----   Message from Carter Kitten, [place] sent at 04/09/2017 10:39 AM EDT ----- Ms. [name] notified as instructed by telephone.  She is agreeable with doing the ECHO.

## 2017-04-28 ENCOUNTER — Other Ambulatory Visit: Payer: Self-pay

## 2017-04-28 ENCOUNTER — Ambulatory Visit (HOSPITAL_COMMUNITY): Payer: Medicare Other | Attending: Internal Medicine

## 2017-04-28 DIAGNOSIS — I10 Essential (primary) hypertension: Secondary | ICD-10-CM | POA: Diagnosis not present

## 2017-04-28 DIAGNOSIS — I071 Rheumatic tricuspid insufficiency: Secondary | ICD-10-CM | POA: Insufficient documentation

## 2017-04-28 DIAGNOSIS — R0609 Other forms of dyspnea: Secondary | ICD-10-CM | POA: Diagnosis not present

## 2017-04-28 DIAGNOSIS — R0600 Dyspnea, unspecified: Secondary | ICD-10-CM

## 2017-05-01 ENCOUNTER — Ambulatory Visit (INDEPENDENT_AMBULATORY_CARE_PROVIDER_SITE_OTHER): Payer: Medicare Other | Admitting: Family Medicine

## 2017-05-01 ENCOUNTER — Encounter: Payer: Self-pay | Admitting: Family Medicine

## 2017-05-01 DIAGNOSIS — F320 Major depressive disorder, single episode, mild: Secondary | ICD-10-CM

## 2017-05-01 DIAGNOSIS — L989 Disorder of the skin and subcutaneous tissue, unspecified: Secondary | ICD-10-CM

## 2017-05-01 DIAGNOSIS — R0600 Dyspnea, unspecified: Secondary | ICD-10-CM

## 2017-05-01 DIAGNOSIS — R0609 Other forms of dyspnea: Secondary | ICD-10-CM | POA: Diagnosis not present

## 2017-05-01 NOTE — Patient Instructions (Addendum)
Continue sertraline for now given improvement in mood.  Work on trying to walk as much as possible. Follow up as scheduled.  Call if any new changes.

## 2017-05-01 NOTE — Assessment & Plan Note (Signed)
Neg eval.  Most likely due to deconditioning.  Increase gentle exercise as able.

## 2017-05-01 NOTE — Assessment & Plan Note (Signed)
Improved with topical steroid.

## 2017-05-01 NOTE — Assessment & Plan Note (Signed)
Improved on sertrline 25mg  with no current SE. Continue.

## 2017-05-01 NOTE — Progress Notes (Signed)
   Subjective:    Patient ID: [name], female    DOB: May 03, 1925, 83 y.o.   MRN: [id number]  HPI  83 year old female presents for follpow up  Multiple issues.  1. Dyspnea on exertion:  She has noted some improvement in mood with less anxiety. She is trying to walk.  No swelling. No chest pain.  no cough.  EKG unremarkable on 6/15 No clear pulmonary issue.  No anemia, nml thyroid.  Recent ECHO showed EF 99-37%, mild diastolic dysfunction  BP Readings from Last 3 Encounters:  05/01/17 140/66  04/03/17 (!) 147/62  09/10/16 127/63     2.depression , major moderate:  Started on sertraline 25 mg daily.  Initially some dizziness.. Now better. She has no longer noted anxiety attack, less depressed and sad. No longer tearful. 90% better. Less overwhelmed.  More used to the idea of not driving and reading. Sleeping at night.   3. Skin lesion, left leg: treated with topical steroid x 2 weeks.  Lesion is much better, less dry, raw and smaller.   Review of Systems  Constitutional: Negative for fatigue and fever.  HENT: Negative for ear pain.   Eyes: Negative for pain.  Respiratory: Positive for shortness of breath. Negative for chest tightness.   Cardiovascular: Negative for chest pain, palpitations and leg swelling.  Gastrointestinal: Negative for abdominal pain.  Genitourinary: Negative for dysuria.       Objective:   Physical Exam  Constitutional: Vital signs are normal. She appears well-developed and well-nourished. She is cooperative.  Non-toxic appearance. She does not appear ill. No distress.  Elderly female in NAD  HENT:  Head: Normocephalic.  Right Ear: Hearing, tympanic membrane, external ear and ear canal normal. Tympanic membrane is not erythematous, not retracted and not bulging.  Left Ear: Hearing, tympanic membrane, external ear and ear canal normal. Tympanic membrane is not erythematous, not retracted and not bulging.  Nose: No mucosal edema or rhinorrhea.  Right sinus exhibits no maxillary sinus tenderness and no frontal sinus tenderness. Left sinus exhibits no maxillary sinus tenderness and no frontal sinus tenderness.  Mouth/Throat: Uvula is midline, oropharynx is clear and moist and mucous membranes are normal.  Eyes: Pupils are equal, round, and reactive to light. Conjunctivae, EOM and lids are normal. Lids are everted and swept, no foreign bodies found.  Neck: Trachea normal and normal range of motion. Neck supple. Carotid bruit is not present. No thyroid mass and no thyromegaly present.  Cardiovascular: Normal rate, regular rhythm, S1 normal, S2 normal, normal heart sounds, intact distal pulses and normal pulses.  Exam reveals no gallop and no friction rub.   No murmur heard. Pulmonary/Chest: Effort normal and breath sounds normal. No tachypnea. No respiratory distress. She has no decreased breath sounds. She has no wheezes. She has no rhonchi. She has no rales.  Abdominal: Soft. Normal appearance and bowel sounds are normal. There is no tenderness.  Neurological: She is alert.  Skin: Skin is warm, dry and intact. No rash noted.   Slight hyperpigmentation, mild erythema 1 cm remains on left anterior leg.  Psychiatric: Her speech is normal and behavior is normal. Judgment and thought content normal. Her mood appears not anxious. Cognition and memory are normal. She does not exhibit a depressed mood.          Assessment & Plan:

## 2017-06-23 ENCOUNTER — Ambulatory Visit (INDEPENDENT_AMBULATORY_CARE_PROVIDER_SITE_OTHER): Payer: Medicare Other

## 2017-06-23 ENCOUNTER — Other Ambulatory Visit: Payer: Medicare Other

## 2017-06-23 ENCOUNTER — Telehealth: Payer: Self-pay | Admitting: Family Medicine

## 2017-06-23 VITALS — BP 138/70 | HR 61 | Temp 97.6°F | Ht 64.0 in | Wt 155.5 lb

## 2017-06-23 DIAGNOSIS — Z0000 Encounter for general adult medical examination without abnormal findings: Secondary | ICD-10-CM

## 2017-06-23 DIAGNOSIS — E782 Mixed hyperlipidemia: Secondary | ICD-10-CM

## 2017-06-23 DIAGNOSIS — R7303 Prediabetes: Secondary | ICD-10-CM | POA: Diagnosis not present

## 2017-06-23 DIAGNOSIS — M8580 Other specified disorders of bone density and structure, unspecified site: Secondary | ICD-10-CM

## 2017-06-23 LAB — COMPREHENSIVE METABOLIC PANEL
ALT: 9 U/L (ref 0–35)
AST: 15 U/L (ref 0–37)
Albumin: 3.7 g/dL (ref 3.5–5.2)
Alkaline Phosphatase: 40 U/L (ref 39–117)
BUN: 24 mg/dL — ABNORMAL HIGH (ref 6–23)
CO2: 26 mEq/L (ref 19–32)
Calcium: 9.6 mg/dL (ref 8.4–10.5)
Chloride: 103 mEq/L (ref 96–112)
Creatinine, Ser: 0.97 mg/dL (ref 0.40–1.20)
GFR: 57.38 mL/min — ABNORMAL LOW (ref >60.00)
Glucose, Bld: 101 mg/dL — ABNORMAL HIGH (ref 70–99)
Potassium: 4.2 mEq/L (ref 3.5–5.1)
Sodium: 137 mEq/L (ref 135–145)
Total Bilirubin: 0.3 mg/dL (ref 0.2–1.2)
Total Protein: 6.6 g/dL (ref 6.0–8.3)

## 2017-06-23 LAB — LIPID PANEL
Cholesterol: 213 mg/dL — ABNORMAL HIGH (ref 0–200)
HDL: 36.7 mg/dL — ABNORMAL LOW (ref >39.00)
LDL Cholesterol: 146 mg/dL — ABNORMAL HIGH (ref 0–99)
NonHDL: 176.07
Total CHOL/HDL Ratio: 6
Triglycerides: 151 mg/dL — ABNORMAL HIGH (ref 0.0–149.0)
VLDL: 30.2 mg/dL (ref 0.0–40.0)

## 2017-06-23 LAB — HEMOGLOBIN A1C: Hgb A1c MFr Bld: 6.1 % (ref 4.6–6.5)

## 2017-06-23 LAB — VITAMIN D 25 HYDROXY (VIT D DEFICIENCY, FRACTURES): VITD: 36.65 ng/mL (ref 30.00–100.00)

## 2017-06-23 NOTE — Progress Notes (Signed)
Subjective:   [name] is a 83 y.o. female who presents for Medicare Annual (Subsequent) preventive examination.  Review of Systems:  N/A Cardiac Risk Factors include: advanced age (>48men, >72 women);dyslipidemia;hypertension     Objective:     Vitals: BP 138/70 (BP Location: Right Arm, Patient Position: Sitting, Cuff Size: Normal)   Pulse 61   Temp 97.6 F (36.4 C) (Oral)   Ht 5\' 4"  (1.626 m) Comment: no shoes  Wt 155 lb 8 oz (70.5 kg)   SpO2 97%   BMI 26.69 kg/m   Body mass index is 26.69 kg/m.   Tobacco History  Smoking Status  . Never Smoker  Smokeless Tobacco  . Never Used     Counseling given: No   Past Medical History:  Diagnosis Date  . Erosive esophagitis   . Gastric ulcer   . Gastritis   . GERD (gastroesophageal reflux disease)   . HLD (hyperlipidemia)   . HTN (hypertension)   . Osteoarthritis   . Osteopenia   . Prediabetes    pt does not check cbg at home   Past Surgical History:  Procedure Laterality Date  . APPENDECTOMY  yrs ago  . arthroscopic knee surgery Left yrs ago  . benign nodule removed from neck  05-21-2013  . EYE SURGERY Bilateral 9- 9-19-2011and 07-2010   cataract lens replacement  . KNEE SURGERY Left 05/2009   to cut nerves  . left knee replacement  09-30-2005  . nuclear cardiolyte  10/08  . right knee replacement Right 08-10-2013  . right shoulder surgery  2005  . TOTAL KNEE REVISION Left 05/30/2013   Procedure: LEFT TOTAL KNEE REVISION;  Surgeon: Mauri Pole, MD;  Location: [facility];  Service: Orthopedics;  Laterality: Left;   Family History  Problem Relation Age of Onset  . Coronary artery disease Father 42  . Hypertension Sister        x 4   History  Sexual Activity  . Sexual activity: Not on file    Outpatient Encounter Prescriptions as of 06/23/2017  Medication Sig  . aspirin 81 MG tablet Take 81 mg by mouth daily.  . Calcium Carbonate-Vitamin D (CALTRATE 600+D) 600-400 MG-UNIT per tablet Take 1 tablet by  mouth 2 (two) times daily.   Marland Kitchen dicyclomine (BENTYL) 10 MG capsule Take 10 mg by mouth 3 (three) times daily as needed (abdomen pain).  . fish oil-omega-3 fatty acids 1000 MG capsule Take 2 g by mouth 2 (two) times daily.   . Glucosamine Sulfate 500 MG CAPS Take 500 mg by mouth 2 (two) times daily.   Marland Kitchen HYDROcodone-acetaminophen (NORCO) 7.5-325 MG per tablet Take 0.5 to 1 tab twice a day if needed.  . hydroxypropyl methylcellulose (ISOPTO TEARS) 2.5 % ophthalmic solution Place 1 drop into both eyes 3 (three) times daily as needed (dry eyes).  Marland Kitchen lisinopril-hydrochlorothiazide (PRINZIDE,ZESTORETIC) 20-25 MG tablet TAKE 1 TABLET EVERY MORNING  . methylPREDNISolone (MEDROL DOSEPAK) 4 MG TBPK tablet 6 day dose pack - take as directed  . metoprolol (LOPRESSOR) 50 MG tablet TAKE 1 TABLET TWICE DAILY (TAKE WITH 25MG  TABLET FOR A TOTAL OF 75MG  TWICE DAILY)  . metoprolol tartrate (LOPRESSOR) 25 MG tablet TAKE 1 TABLET TWICE DAILY (TAKE WITH 50MG  TABLET FOR A TOTAL OF 75MG  TWICE DAILY)  . Multiple Vitamin (MULTIVITAMIN) tablet Take 1 tablet by mouth daily.    Marland Kitchen omeprazole (PRILOSEC) 40 MG capsule TAKE 1 CAPSULE EVERY DAY  . OVER THE COUNTER MEDICATION I caps 1  tab bid  . oxyCODONE-acetaminophen (PERCOCET/ROXICET) 5-325 MG tablet Take 1 tablet by mouth every 6 (six) hours as needed.  . sertraline (ZOLOFT) 25 MG tablet Take 1 tablet (25 mg total) by mouth daily.  Marland Kitchen triamcinolone cream (KENALOG) 0.5 % Apply 1 application topically 2 (two) times daily.   No facility-administered encounter medications on file as of 06/23/2017.     Activities of Daily Living In your present state of health, do you have any difficulty performing the following activities: 06/23/2017  Hearing? Y  Comment hearing aids  Vision? Y  Comment macular degeneration  Difficulty concentrating or making decisions? Y  Walking or climbing stairs? Y  Comment SOB when walking long distances  Dressing or bathing? N  Doing errands, shopping? Y    Comment pt does not drive due to vision  Preparing Food and eating ? N  Using the Toilet? N  In the past six months, have you accidently leaked urine? Y  Comment pt states  there is leakage if she waits too long to urinate  Do you have problems with loss of bowel control? N  Managing your Medications? Y  Managing your Finances? Y  Comment daughter assists with finances  Housekeeping or managing your Housekeeping? Y  Some recent data might be hidden    Patient Care Team: Jinny Sanders, MD as PCP - General    Assessment:    Hearing Screening Comments: Hearing aids - bilateral Vision Screening Comments: March 2018 - last vision exam  Exercise Activities and Dietary recommendations Current Exercise Habits: The patient does not participate in regular exercise at present, Exercise limited by: None identified  Goals    . Increase water intake          Starting 06/23/2017, I will attempt to drink 6-8 glasses of water daily.       Fall Risk Fall Risk  06/23/2017 06/20/2016 06/19/2015 06/13/2014 05/10/2013  Falls in the past year? No No No No No   Depression Screen PHQ 2/9 Scores 06/23/2017 04/03/2017 04/03/2017 06/20/2016  PHQ - 2 Score 1 3 2 1   PHQ- 9 Score 1 8 3  -     Cognitive Function MMSE - Mini Mental State Exam 06/23/2017  Orientation to time 5  Orientation to Place 5  Registration 3  Attention/ Calculation 0  Recall 3  Language- name 2 objects 0  Language- repeat 1  Language- follow 3 step command 3  Language- read & follow direction 0  Write a sentence 0  Copy design 0  Total score 20   PLEASE NOTE: A Mini-Cog screen was completed. Maximum score is 20. A value of 0 denotes this part of Folstein MMSE was not completed or the patient failed this part of the Mini-Cog screening.   Mini-Cog Screening Orientation to Time - Max 5 pts Orientation to Place - Max 5 pts Registration - Max 3 pts Recall - Max 3 pts Language Repeat - Max 1 pts Language Follow 3 Step Command - Max  3 pts     Immunization History  Administered Date(s) Administered  . H1N1 11/06/2008  . Influenza Split 07/22/2012  . Influenza Whole 07/21/2007, 07/20/2008, 08/20/2010  . Influenza-Unspecified 07/20/2016  . Pneumococcal Conjugate-13 06/13/2014  . Pneumococcal Polysaccharide-23 10/20/1998  . Td 10/20/1998, 11/06/2008  . Zoster 10/20/2008   Screening Tests Health Maintenance  Topic Date Due  . INFLUENZA VACCINE  01/16/2018 (Originally 05/20/2017)  . TETANUS/TDAP  11/06/2018  . DEXA SCAN  Completed  . PNA vac Low  Risk Adult  Completed      Plan:     I have personally reviewed and addressed the Medicare Annual Wellness questionnaire and have noted the following in the patient's chart:  A. Medical and social history B. Use of alcohol, tobacco or illicit drugs  C. Current medications and supplements D. Functional ability and status E.  Nutritional status F.  Physical activity G. Advance directives H. List of other physicians I.  Hospitalizations, surgeries, and ER visits in previous 12 months J.  [place] to include hearing, vision, cognitive, depression L. Referrals and appointments - none  In addition, I have reviewed and discussed with patient certain preventive protocols, quality metrics, and best practice recommendations. A written personalized care plan for preventive services as well as general preventive health recommendations were provided to patient.  See attached scanned questionnaire for additional information.   Signed,   Lindell Noe, MHA, BS, LPN Health Coach

## 2017-06-23 NOTE — Progress Notes (Signed)
Pre visit review using our clinic review tool, if applicable. No additional management support is needed unless otherwise documented below in the visit note. 

## 2017-06-23 NOTE — Telephone Encounter (Signed)
-----   Message from Eustace Pen, LPN sent at 11/20/8286  3:50 PM EDT ----- Regarding: Labs 9/4 Please place lab orders.   Tr[employer] Medicare

## 2017-06-23 NOTE — Progress Notes (Signed)
PCP notes:   Health maintenance:  Flu vaccine - PCP please address at next appt  Abnormal screenings:   Depression score: 1  Patient concerns:   None  Nurse concerns:  None  Next PCP appt:   06/26/17 @ 1115

## 2017-06-23 NOTE — Patient Instructions (Signed)
[name] , Thank you for taking time to come for your Medicare Wellness Visit. I appreciate your ongoing commitment to your health goals. Please review the following plan we discussed and let me know if I can assist you in the future.   These are the goals we discussed: Goals    . Increase water intake          Starting 06/23/2017, I will attempt to drink 6-8 glasses of water daily.        This is a list of the screening recommended for you and due dates:  Health Maintenance  Topic Date Due  . Flu Shot  01/16/2018*  . Tetanus Vaccine  11/06/2018  . DEXA scan (bone density measurement)  Completed  . Pneumonia vaccines  Completed  *Topic was postponed. The date shown is not the original due date.   Preventive Care for Adults  A healthy lifestyle and preventive care can promote health and wellness. Preventive health guidelines for adults include the following key practices.  . A routine yearly physical is a good way to check with your health care provider about your health and preventive screening. It is a chance to share any concerns and updates on your health and to receive a thorough exam.  . Visit your dentist for a routine exam and preventive care every 6 months. Brush your teeth twice a day and floss once a day. Good oral hygiene prevents tooth decay and gum disease.  . The frequency of eye exams is based on your age, health, family medical history, use  of contact lenses, and other factors. Follow your health care provider's ecommendations for frequency of eye exams.  . Eat a healthy diet. Foods like vegetables, fruits, whole grains, low-fat dairy products, and lean protein foods contain the nutrients you need without too many calories. Decrease your intake of foods high in solid fats, added sugars, and salt. Eat the right amount of calories for you. Get information about a proper diet from your health care provider, if necessary.  . Regular physical exercise is one of the most  important things you can do for your health. Most adults should get at least 150 minutes of moderate-intensity exercise (any activity that increases your heart rate and causes you to sweat) each week. In addition, most adults need muscle-strengthening exercises on 2 or more days a week.  Silver Sneakers may be a benefit available to you. To determine eligibility, you may visit the website: www.silversneakers.com or contact program at 220 529 1003 Mon-Fri between 8AM-8PM.   . Maintain a healthy weight. The body mass index (BMI) is a screening tool to identify possible weight problems. It provides an estimate of body fat based on height and weight. Your health care provider can find your BMI and can help you achieve or maintain a healthy weight.   For adults 20 years and older: ? A BMI below 18.5 is considered underweight. ? A BMI of 18.5 to 24.9 is normal. ? A BMI of 25 to 29.9 is considered overweight. ? A BMI of 30 and above is considered obese.   . Maintain normal blood lipids and cholesterol levels by exercising and minimizing your intake of saturated fat. Eat a balanced diet with plenty of fruit and vegetables. Blood tests for lipids and cholesterol should begin at age 62 and be repeated every 5 years. If your lipid or cholesterol levels are high, you are over 50, or you are at high risk for heart disease, you may need  your cholesterol levels checked more frequently. Ongoing high lipid and cholesterol levels should be treated with medicines if diet and exercise are not working.  . If you smoke, find out from your health care provider how to quit. If you do not use tobacco, please do not start.  . If you choose to drink alcohol, please do not consume more than 2 drinks per day. One drink is considered to be 12 ounces (355 mL) of beer, 5 ounces (148 mL) of wine, or 1.5 ounces (44 mL) of liquor.  . If you are 46-36 years old, ask your health care provider if you should take aspirin to prevent  strokes.  . Use sunscreen. Apply sunscreen liberally and repeatedly throughout the day. You should seek shade when your shadow is shorter than you. Protect yourself by wearing long sleeves, pants, a wide-brimmed hat, and sunglasses year round, whenever you are outdoors.  . Once a month, do a whole body skin exam, using a mirror to look at the skin on your back. Tell your health care provider of new moles, moles that have irregular borders, moles that are larger than a pencil eraser, or moles that have changed in shape or color.

## 2017-06-24 NOTE — Progress Notes (Signed)
I reviewed health advisor's note, was available for consultation, and agree with documentation and plan.  

## 2017-06-26 ENCOUNTER — Ambulatory Visit (INDEPENDENT_AMBULATORY_CARE_PROVIDER_SITE_OTHER): Payer: Medicare Other | Admitting: Family Medicine

## 2017-06-26 ENCOUNTER — Encounter: Payer: Self-pay | Admitting: Family Medicine

## 2017-06-26 VITALS — BP 153/65 | HR 59 | Temp 98.2°F | Ht 64.0 in | Wt 156.5 lb

## 2017-06-26 DIAGNOSIS — Z0001 Encounter for general adult medical examination with abnormal findings: Secondary | ICD-10-CM

## 2017-06-26 DIAGNOSIS — N183 Chronic kidney disease, stage 3 unspecified: Secondary | ICD-10-CM

## 2017-06-26 DIAGNOSIS — E785 Hyperlipidemia, unspecified: Secondary | ICD-10-CM | POA: Diagnosis not present

## 2017-06-26 DIAGNOSIS — Z23 Encounter for immunization: Secondary | ICD-10-CM

## 2017-06-26 DIAGNOSIS — I10 Essential (primary) hypertension: Secondary | ICD-10-CM

## 2017-06-26 DIAGNOSIS — E782 Mixed hyperlipidemia: Secondary | ICD-10-CM

## 2017-06-26 DIAGNOSIS — Z0000 Encounter for general adult medical examination without abnormal findings: Secondary | ICD-10-CM

## 2017-06-26 DIAGNOSIS — F320 Major depressive disorder, single episode, mild: Secondary | ICD-10-CM

## 2017-06-26 DIAGNOSIS — R7303 Prediabetes: Secondary | ICD-10-CM

## 2017-06-26 NOTE — Assessment & Plan Note (Signed)
Borderline high.. Will follow at home and call if above goal.   Will tolerate higher gosl given age.

## 2017-06-26 NOTE — Assessment & Plan Note (Signed)
Stable control. 

## 2017-06-26 NOTE — Assessment & Plan Note (Signed)
Good control. Encouraged exercise, weight maintanence, healthy eating habits.

## 2017-06-26 NOTE — Progress Notes (Signed)
Subjective:    Patient ID: [name], female    DOB: 07-Nov-1924, 83 y.o.   MRN: [id number]  HPI The patient presents for  complete physical and review of chronic health problems.   The patient saw Candis Musa, LPN for medicare wellness. Note reviewed in detail and important notes copied below.  Health maintenance: Flu vaccine - PCP please address at next appt  Abnormal screenings:  Depression score: 1   TODAY: Doing well overall.  Helping care for ill sister.  Living on own in a house, daughter lives behind her in a mobile home.  Hypertension:   Borderline elevated, but tolerable given age on metoprolol, lisinopril HCTZ  Using medication without problems or lightheadedness:  none Chest pain with exertion: none Edema: occ Short of breath: stable SOB Average home BPs: not checking BP Readings from Last 3 Encounters:  06/26/17 (!) 153/65  06/23/17 138/70  05/01/17 140/66  Other issues:  Prediabetes:  Lab Results  Component Value Date   HGBA1C 6.1 06/23/2017   Vit D nml  Elevated Cholesterol:  Inadequate control, but not going to start med given age. LDL goal < 100 Lab Results  Component Value Date   CHOL 213 (H) 06/23/2017   HDL 36.70 (L) 06/23/2017   LDLCALC 146 (H) 06/23/2017   LDLDIRECT 112.0 06/13/2016   TRIG 151.0 (H) 06/23/2017   CHOLHDL 6 06/23/2017  Diet compliance: moderate Exercise: walking some Other complaints:   Recent gout flare in right ankle, swellign and pain were mild.  Social History /Family History/Past Medical History reviewed in detail and updated in EMR if needed. Blood pressure (!) 153/65, pulse (!) 59, temperature 98.2 F (36.8 C), temperature source Oral, height 5\' 4"  (1.626 m), weight 156 lb 8 oz (71 kg).    Review of Systems  Constitutional: Negative for fatigue and fever.  HENT: Negative for congestion.   Eyes: Negative for pain.  Respiratory: Negative for cough and shortness of breath.   Cardiovascular: Negative  for chest pain, palpitations and leg swelling.  Gastrointestinal: Negative for abdominal pain.  Genitourinary: Negative for dysuria and vaginal bleeding.  Musculoskeletal: Negative for back pain.  Neurological: Negative for syncope, light-headedness and headaches.  Psychiatric/Behavioral: Negative for dysphoric mood.       Objective:   Physical Exam  Constitutional: Vital signs are normal. She appears well-developed and well-nourished. She is cooperative.  Non-toxic appearance. She does not appear ill. No distress.  Elderly female appear younger than state age  HENT:  Head: Normocephalic.  Right Ear: Hearing, tympanic membrane, external ear and ear canal normal.  Left Ear: Hearing, tympanic membrane, external ear and ear canal normal.  Nose: Nose normal.  Eyes: Pupils are equal, round, and reactive to light. Conjunctivae, EOM and lids are normal. Lids are everted and swept, no foreign bodies found.  Neck: Trachea normal and normal range of motion. Neck supple. Carotid bruit is not present. No thyroid mass and no thyromegaly present.  Cardiovascular: Normal rate, regular rhythm, S1 normal, S2 normal, normal heart sounds and intact distal pulses.  Exam reveals no gallop.   No murmur heard. Varicosities and 1 plus pitting edema bilateral lower legs  Pulmonary/Chest: Effort normal and breath sounds normal. No respiratory distress. She has no wheezes. She has no rhonchi. She has no rales.  Abdominal: Soft. Normal appearance and bowel sounds are normal. She exhibits no distension, no fluid wave, no abdominal bruit and no mass. There is no hepatosplenomegaly. There is no tenderness. There is no  rebound, no guarding and no CVA tenderness. No hernia.  Lymphadenopathy:    She has no cervical adenopathy.    She has no axillary adenopathy.  Neurological: She is alert. She has normal strength. No cranial nerve deficit or sensory deficit. Coordination and gait normal.  Skin: Skin is warm, dry and  intact. No rash noted.  Psychiatric: Her speech is normal and behavior is normal. Judgment normal. Her mood appears not anxious. Cognition and memory are normal. She does not exhibit a depressed mood.      Assessment & Plan:  The patient's preventative maintenance and recommended screening tests for an annual wellness exam were reviewed in full today. Brought up to date unless services declined.  Counselled on the importance of diet, exercise, and its role in overall health and mortality. The patient's FH and SH was reviewed, including their home life, tobacco status, and drug and alcohol status.   Vaccines: Uptodate with vaccines Td, PNA, prevnar, zostavax,  flu Colon: incomplete colonoscopy 2009.Marland Kitchen Not indicated given age. Pap/DVE not indicated given age 12: likes to continue, sister and 2 daughters with breast cancer. Last nml 10/2016, will plan every 2 years. Non smoker  DEXA: last in 2014 osteopenia stable , repeat in 5 years.

## 2017-06-26 NOTE — Assessment & Plan Note (Signed)
Above goal but will not treat aggressively with statin given age.

## 2017-06-26 NOTE — Assessment & Plan Note (Signed)
Good control on sertraline low dose.

## 2017-06-26 NOTE — Patient Instructions (Addendum)
Follow BP at home off and on.. Goal < 150/90. Call if  running consistently high.  Try walk as much as tolerate and keep animal fat and cholesterol low in diet.  When you are setting up mammogram in January, call our office to add an order for bone density as well.  Low-Purine Diet Purines are compounds that affect the level of uric acid in your body. A low-purine diet is a diet that is low in purines. Eating a low-purine diet can prevent the level of uric acid in your body from getting too high and causing gout or kidney stones or both. What do I need to know about this diet?  Choose low-purine foods. Examples of low-purine foods are listed in the next section.  Drink plenty of fluids, especially water. Fluids can help remove uric acid from your body. Try to drink 8-16 cups (1.9-3.8 L) a day.  Limit foods high in fat, especially saturated fat, as fat makes it harder for the body to get rid of uric acid. Foods high in saturated fat include pizza, cheese, ice cream, whole milk, fried foods, and gravies. Choose foods that are lower in fat and lean sources of protein. Use olive oil when cooking as it contains healthy fats that are not high in saturated fat.  Limit alcohol. Alcohol interferes with the elimination of uric acid from your body. If you are having a gout attack, avoid all alcohol.  Keep in mind that different people's bodies react differently to different foods. You will probably learn over time which foods do or do not affect you. If you discover that a food tends to cause your gout to flare up, avoid eating that food. You can more freely enjoy foods that do not cause problems. If you have any questions about a food item, talk to your dietitian or health care provider. Which foods are low, moderate, and high in purines? The following is a list of foods that are low, moderate, and high in purines. You can eat any amount of the foods that are low in purines. You may be able to have small  amounts of foods that are moderate in purines. Ask your health care provider how much of a food moderate in purines you can have. Avoid foods high in purines. Grains  Foods low in purines: Enriched white bread, pasta, rice, cake, cornbread, popcorn.  Foods moderate in purines: Whole-grain breads and cereals, wheat germ, bran, oatmeal. Uncooked oatmeal. Dry wheat bran or wheat germ.  Foods high in purines: Pancakes, [place] toast, biscuits, muffins. Vegetables  Foods low in purines: All vegetables, except those that are moderate in purines.  Foods moderate in purines: Asparagus, cauliflower, spinach, mushrooms, green peas. Fruits  All fruits are low in purines. Meats and other Protein Foods  Foods low in purines: Eggs, nuts, peanut butter.  Foods moderate in purines: 80-90% lean beef, lamb, veal, pork, poultry, fish, eggs, peanut butter, nuts. Crab, lobster, oysters, and shrimp. Cooked dried beans, peas, and lentils.  Foods high in purines: Anchovies, sardines, herring, mussels, tuna, codfish, scallops, trout, and haddock. Berniece Salines. Organ meats (such as liver or kidney). Tripe. Game meat. Goose. Sweetbreads. Dairy  All dairy foods are low in purines. Low-fat and fat-free dairy products are best because they are low in saturated fat. Beverages  Drinks low in purines: Water, carbonated beverages, tea, coffee, cocoa.  Drinks moderate in purines: Soft drinks and other drinks sweetened with high-fructose corn syrup. Juices. To find whether a food or drink  is sweetened with high-fructose corn syrup, look at the ingredients list.  Drinks high in purines: Alcoholic beverages (such as beer). Condiments  Foods low in purines: Salt, herbs, olives, pickles, relishes, vinegar.  Foods moderate in purines: Butter, margarine, oils, mayonnaise. Fats and Oils  Foods low in purines: All types, except gravies and sauces made with meat.  Foods high in purines: Gravies and sauces made with  meat. Other Foods  Foods low in purines: Sugars, sweets, gelatin. Cake. Soups made without meat.  Foods moderate in purines: Meat-based or fish-based soups, broths, or bouillons. Foods and drinks sweetened with high-fructose corn syrup.  Foods high in purines: High-fat desserts (such as ice cream, cookies, cakes, pies, doughnuts, and chocolate). Contact your dietitian for more information on foods that are not listed here. This information is not intended to replace advice given to you by your health care provider. Make sure you discuss any questions you have with your health care provider. Document Released: 01/31/2011 Document Revised: 03/13/2016 Document Reviewed: 09/12/2013 Elsevier Interactive Patient Education  2017 [employer].

## 2017-08-08 ENCOUNTER — Other Ambulatory Visit: Payer: Self-pay | Admitting: Family Medicine

## 2017-08-29 ENCOUNTER — Other Ambulatory Visit: Payer: Self-pay | Admitting: Family Medicine

## 2017-09-30 ENCOUNTER — Telehealth: Payer: Self-pay | Admitting: Family Medicine

## 2017-09-30 DIAGNOSIS — M8580 Other specified disorders of bone density and structure, unspecified site: Secondary | ICD-10-CM

## 2017-09-30 NOTE — Telephone Encounter (Signed)
Spoke to pt. Needs order for dexa scan. Please advise

## 2017-10-14 ENCOUNTER — Other Ambulatory Visit: Payer: Self-pay | Admitting: Family Medicine

## 2017-10-14 DIAGNOSIS — Z1231 Encounter for screening mammogram for malignant neoplasm of breast: Secondary | ICD-10-CM

## 2017-11-10 ENCOUNTER — Ambulatory Visit
Admission: RE | Admit: 2017-11-10 | Discharge: 2017-11-10 | Disposition: A | Discharge status: Home / Self Care | Payer: Medicare Other | Source: Ambulatory Visit | Attending: Family Medicine | Admitting: Family Medicine

## 2017-11-10 ENCOUNTER — Other Ambulatory Visit: Payer: PRIVATE HEALTH INSURANCE

## 2017-11-10 ENCOUNTER — Ambulatory Visit: Payer: PRIVATE HEALTH INSURANCE

## 2017-11-10 DIAGNOSIS — Z780 Asymptomatic menopausal state: Secondary | ICD-10-CM | POA: Diagnosis not present

## 2017-11-10 DIAGNOSIS — M8580 Other specified disorders of bone density and structure, unspecified site: Secondary | ICD-10-CM

## 2017-11-10 DIAGNOSIS — Z1231 Encounter for screening mammogram for malignant neoplasm of breast: Secondary | ICD-10-CM

## 2017-11-10 DIAGNOSIS — M8589 Other specified disorders of bone density and structure, multiple sites: Secondary | ICD-10-CM | POA: Diagnosis not present

## 2017-11-18 ENCOUNTER — Telehealth: Payer: Self-pay | Admitting: Family Medicine

## 2017-11-18 NOTE — Telephone Encounter (Signed)
Copied from [place]. Topic: Quick Communication - See Telephone Encounter >> Nov 18, 2017  4:08 PM Hewitt Shorts wrote: CRM for notification. See Telephone encounter for:pt is wanting to get something called in for gout best number is daughter Thayer Headings at [phone]    11/18/17.

## 2017-11-18 NOTE — Telephone Encounter (Signed)
I spoke with [name] (DPR signed); pt has had gout previously. Rt ankle swollen and painful to walk on with slight redness started on 11/12/17. Request med sent to [facility].

## 2017-11-19 NOTE — Telephone Encounter (Signed)
I do not have gout on her problem list.  This may be gout but cannot be sure.. Have her make an appt to be evaluated then treated. If pain severe try to work in somewhere today.. Otherwise add to my Friday schedule. She may use short course of ibuprofen 800 mg TID until then.   Lab Results  Component Value Date   CREATININE 0.97 06/23/2017

## 2017-11-19 NOTE — Telephone Encounter (Signed)
Thayer Headings notified as instructed by telephone.  Appointment scheduled with Dr. Diona Browner 11/20/2017 at 9:30 am.

## 2017-11-20 ENCOUNTER — Ambulatory Visit: Payer: Medicare Other | Admitting: Family Medicine

## 2017-12-30 ENCOUNTER — Ambulatory Visit: Payer: Self-pay | Admitting: *Deleted

## 2017-12-30 NOTE — Telephone Encounter (Signed)
Pt's daughter called because her mom has a swollen left leg with cracking and oozing around her ankle. She states the leg is swollen and can be painful to touch.  Advised her to keep the leg elevated and wrap the part that is oozing with gauze to contain the drainage. Denies odor coming from it. She also denies shortness of breath or fever. Home care advice given. Advised to take per pain med for the discomfort, she has Tramadol per her daughter.  Per protocol, appointment should be same day, no availability. Will put her on the waiting list for today. Appointment made for tomorrow.  Reason for Disposition . [1] Red area or streak [2] large (> 2 in. or 5 cm)  Answer Assessment - Initial Assessment Questions 1. ONSET: "When did the swelling start?" (e.g., minutes, hours, days)     Started on Sunday or Monday 2. LOCATION: "What part of the leg is swollen?"  "Are both legs swollen or just one leg?"     Swelling started at the back of calf coming down on the left leg. Left ankle cracked and oozing 3. SEVERITY: "How bad is the swelling?" (e.g., localized; mild, moderate, severe)  - Localized - small area of swelling localized to one leg  - MILD pedal edema - swelling limited to foot and ankle, pitting edema < 1/4 inch (6 mm) deep, rest and elevation eliminate most or all swelling  - MODERATE edema - swelling of lower leg to knee, pitting edema > 1/4 inch (6 mm) deep, rest and elevation only partially reduce swelling  - SEVERE edema - swelling extends above knee, facial or hand swelling present      moderate 4. REDNESS: "Does the swelling look red or infected?"     Red and looks infected around the ankle 5. PAIN: "Is the swelling painful to touch?" If so, ask: "How painful is it?"   (Scale 1-10; mild, moderate or severe)     moderate 6. FEVER: "Do you have a fever?" If so, ask: "What is it, how was it measured, and when did it start?"      no 7. CAUSE: "What do you think is causing the leg  swelling?"     Not sure 8. MEDICAL HISTORY: "Do you have a history of heart failure, kidney disease, liver failure, or cancer?"     no 9. RECURRENT SYMPTOM: "Have you had leg swelling before?" If so, ask: "When was the last time?" "What happened that time?"     Never had this happen before 10. OTHER SYMPTOMS: "Do you have any other symptoms?" (e.g., chest pain, difficulty breathing)       no 11. PREGNANCY: "Is there any chance you are pregnant?" "When was your last menstrual period?"       no  Protocols used: LEG SWELLING AND EDEMA-A-AH

## 2017-12-31 ENCOUNTER — Encounter: Payer: Self-pay | Admitting: Internal Medicine

## 2017-12-31 ENCOUNTER — Ambulatory Visit (INDEPENDENT_AMBULATORY_CARE_PROVIDER_SITE_OTHER): Payer: Medicare Other | Admitting: Internal Medicine

## 2017-12-31 VITALS — BP 142/74 | HR 63 | Temp 98.0°F | Wt 152.0 lb

## 2017-12-31 DIAGNOSIS — L03116 Cellulitis of left lower limb: Secondary | ICD-10-CM | POA: Diagnosis not present

## 2017-12-31 MED ORDER — DOXYCYCLINE HYCLATE 100 MG PO TABS: 100.0000 mg | ORAL_TABLET | Freq: Two times a day (BID) | ORAL | 0 refills | Status: DC

## 2017-12-31 MED ORDER — FUROSEMIDE 20 MG PO TABS: 20.0000 mg | ORAL_TABLET | Freq: Every day | ORAL | 0 refills | Status: DC

## 2017-12-31 MED ORDER — SULFAMETHOXAZOLE-TRIMETHOPRIM 800-160 MG PO TABS: 1.0000 | ORAL_TABLET | Freq: Two times a day (BID) | ORAL | 0 refills | Status: DC

## 2017-12-31 NOTE — Patient Instructions (Signed)

## 2017-12-31 NOTE — Progress Notes (Signed)
Subjective:    Patient ID: [name], female    DOB: 1924-12-03, 84 y.o.   MRN: [id number]  HPI  Pt presents to the clinic today with c/o left leg swelling and weeping. She reports this started 1-2 weeks ago. The lower leg is also a little red and tender to touch. She is not sure what caused this. She has not really tried anything OTC other than elevation. She has no history of CHF or PVD. She denies cough, fatigue, shortness of breath or chest pain.  Review of Systems      Past Medical History:  Diagnosis Date  . Erosive esophagitis   . Gastric ulcer   . Gastritis   . GERD (gastroesophageal reflux disease)   . HLD (hyperlipidemia)   . HTN (hypertension)   . Osteoarthritis   . Osteopenia   . Prediabetes    pt does not check cbg at home    Current Outpatient Medications  Medication Sig Dispense Refill  . aspirin 81 MG tablet Take 81 mg by mouth daily.    . Calcium Carbonate-Vitamin D (CALTRATE 600+D) 600-400 MG-UNIT per tablet Take 1 tablet by mouth 2 (two) times daily.     Marland Kitchen dicyclomine (BENTYL) 10 MG capsule Take 10 mg by mouth 3 (three) times daily as needed (abdomen pain).    . fish oil-omega-3 fatty acids 1000 MG capsule Take 2 g by mouth 2 (two) times daily.     . Glucosamine Sulfate 500 MG CAPS Take 500 mg by mouth 2 (two) times daily.     Marland Kitchen HYDROcodone-acetaminophen (NORCO) 7.5-325 MG per tablet Take 0.5 to 1 tab twice a day if needed. 30 tablet 0  . hydroxypropyl methylcellulose (ISOPTO TEARS) 2.5 % ophthalmic solution Place 1 drop into both eyes 3 (three) times daily as needed (dry eyes).    Marland Kitchen lisinopril-hydrochlorothiazide (PRINZIDE,ZESTORETIC) 20-25 MG tablet TAKE 1 TABLET EVERY MORNING 90 tablet 1  . metoprolol tartrate (LOPRESSOR) 25 MG tablet TAKE 1 TABLET TWICE DAILY (TAKE WITH 50MG  TABLET FOR A TOTAL OF 75MG  TWICE DAILY) 180 tablet 1  . metoprolol tartrate (LOPRESSOR) 50 MG tablet TAKE 1 TABLET TWICE DAILY (TAKE WITH 25MG  TABLET FOR A TOTAL OF 75MG  TWICE  DAILY) 180 tablet 1  . Multiple Vitamin (MULTIVITAMIN) tablet Take 1 tablet by mouth daily.      Marland Kitchen omeprazole (PRILOSEC) 40 MG capsule TAKE 1 CAPSULE EVERY DAY 90 capsule 3  . OVER THE COUNTER MEDICATION I caps 1 tab bid    . oxyCODONE-acetaminophen (PERCOCET/ROXICET) 5-325 MG tablet Take 1 tablet by mouth every 6 (six) hours as needed. 8 tablet 0  . sertraline (ZOLOFT) 25 MG tablet TAKE 1 TABLET BY MOUTH EVERY DAY 90 tablet 1  . triamcinolone cream (KENALOG) 0.5 % Apply 1 application topically 2 (two) times daily. 15 g 0   No current facility-administered medications for this visit.     No Known Allergies  Family History  Problem Relation Age of Onset  . Coronary artery disease Father 44  . Hypertension Sister        x 4  . Breast cancer Sister 81  . Breast cancer Daughter 81    Social History   Socioeconomic History  . Marital status: Widowed    Spouse name: Not on file  . Number of children: Not on file  . Years of education: Not on file  . Highest education level: Not on file  Social Needs  . Financial resource strain: Not on file  .  Food insecurity - worry: Not on file  . Food insecurity - inability: Not on file  . Transportation needs - medical: Not on file  . Transportation needs - non-medical: Not on file  Occupational History  . Not on file  Tobacco Use  . Smoking status: Never Smoker  . Smokeless tobacco: Never Used  Substance and Sexual Activity  . Alcohol use: No    Alcohol/week: 0.0 oz  . Drug use: No  . Sexual activity: Not on file  Other Topics Concern  . Not on file  Social History Narrative   Full code, has living will, HCPOA children (reviewed 05/2014)     Constitutional: Denies fever, malaise, fatigue, headache or abrupt weight changes.  Respiratory: Denies difficulty breathing, shortness of breath, cough or sputum production.   Cardiovascular: Pt reports swelling of LLE. Denies chest pain, chest tightness, palpitations or swelling in the  hands.  Skin: Pt reports redness and weeping of LLE. Denies rashes, lesions or ulcercations.    No other specific complaints in a complete review of systems (except as listed in HPI above).  Objective:   Physical Exam  BP (!) 142/74   Pulse 63   Temp 98 F (36.7 C) (Oral)   Wt 152 lb (68.9 kg)   SpO2 97%   BMI 26.09 kg/m  Wt Readings from Last 3 Encounters:  12/31/17 152 lb (68.9 kg)  06/26/17 156 lb 8 oz (71 kg)  06/23/17 155 lb 8 oz (70.5 kg)    General: Appears her stated age, well developed, well nourished in NAD. Skin: LLE with cellulitis, swelling and weeping noted. Musculoskeletal: Normal flexion, extension and rotation of the left ankle. LLE with 1+ pitting edema. Negative Homan's. Calf soft and nontender.   BMET    Component Value Date/Time   NA 137 06/23/2017 1045   K 4.2 06/23/2017 1045   CL 103 06/23/2017 1045   CO2 26 06/23/2017 1045   GLUCOSE 101 (H) 06/23/2017 1045   BUN 24 (H) 06/23/2017 1045   CREATININE 0.97 06/23/2017 1045   CALCIUM 9.6 06/23/2017 1045   GFRNONAA 33 (L) 06/01/2013 0405   GFRAA 38 (L) 06/01/2013 0405    Lipid Panel     Component Value Date/Time   CHOL 213 (H) 06/23/2017 1045   TRIG 151.0 (H) 06/23/2017 1045   HDL 36.70 (L) 06/23/2017 1045   CHOLHDL 6 06/23/2017 1045   VLDL 30.2 06/23/2017 1045   LDLCALC 146 (H) 06/23/2017 1045    CBC    Component Value Date/Time   WBC 8.4 04/07/2017 0837   RBC 4.31 04/07/2017 0837   HGB 13.2 04/07/2017 0837   HCT 39.0 04/07/2017 0837   PLT 236.0 04/07/2017 0837   MCV 90.5 04/07/2017 0837   MCH 30.7 09/10/2016 1310   MCHC 33.9 04/07/2017 0837   RDW 14.7 04/07/2017 0837   LYMPHSABS 2.5 04/07/2017 0837   MONOABS 0.8 04/07/2017 0837   EOSABS 1.4 (H) 04/07/2017 0837   BASOSABS 0.1 04/07/2017 0837    Hgb A1C Lab Results  Component Value Date   HGBA1C 6.1 06/23/2017            Assessment & Plan:   Cellulitis of LLE:  eRx for Lasix 20 mg daily x 3 days Encouraged  elevation eRx for Doxycycline 100 mg BID x 10 days eRx for Septra BID x 10 days ER precautions discussed  Advised her to make an appt on Monday with PCP for reeval Webb Silversmith, NP

## 2018-01-04 ENCOUNTER — Other Ambulatory Visit: Payer: Self-pay

## 2018-01-04 ENCOUNTER — Encounter: Payer: Self-pay | Admitting: Family Medicine

## 2018-01-04 ENCOUNTER — Ambulatory Visit (INDEPENDENT_AMBULATORY_CARE_PROVIDER_SITE_OTHER): Payer: Medicare Other | Admitting: Family Medicine

## 2018-01-04 VITALS — BP 140/70 | HR 60 | Temp 97.7°F | Ht 64.0 in | Wt 154.2 lb

## 2018-01-04 DIAGNOSIS — L03116 Cellulitis of left lower limb: Secondary | ICD-10-CM

## 2018-01-04 MED ORDER — CEPHALEXIN 500 MG PO CAPS: 1000.0000 mg | ORAL_CAPSULE | Freq: Two times a day (BID) | ORAL | 0 refills | Status: DC

## 2018-01-04 MED ORDER — CEFTRIAXONE SODIUM 1 G IJ SOLR
1.0000 g | Freq: Once | INTRAMUSCULAR | Status: AC
  Administered 2018-01-04: 1 g via INTRAMUSCULAR

## 2018-01-04 NOTE — Patient Instructions (Signed)
STOP the doxycylcine  KEEP TAKING the SEPTRA / SULFA medication  I am going to change you to a new medication called cephalexin that you should start taking tomorrow.

## 2018-01-04 NOTE — Progress Notes (Signed)
Dr. Frederico Hamman T. Kaileah Shevchenko, MD, [facility] [place], [address] Phone: [phone] Fax: [phone]  01/04/2018  Patient: [name], MRN: [id number], DOB: 01/04/1925, 84 y.o.  Primary Physician:  Jinny Sanders, MD   Chief Complaint  Patient presents with  . Follow-up    Cellulitis on leg-Seen by Avie Echevaria on 3/14   Subjective:   [name] is a 84 y.o. very pleasant female patient who presents with the following:  F/u leg cellulitis.  This is a follow-up from December 31, 2017, and at that point the patient was seen in the office and placed on doxycycline as well as Septra for potential cellulitis.  This actually started at least a week before this, before the patient showed this to her family members.  They do not think that it is that much better, it may be slightly better, and at this point it is relatively extensive throughout the almost entirety of the left leg.  Extensive leg cellulitis, left leg and foot.   Past Medical History, Surgical History, Social History, Family History, Problem List, Medications, and Allergies have been reviewed and updated if relevant.  Patient Active Problem List   Diagnosis Date Noted  . Dyspnea on exertion 04/03/2017  . Depression, major, single episode, mild ([place]) 04/03/2017  . Skin lesion of left lower extremity 04/03/2017  . CKD (chronic kidney disease) stage 3, GFR 30-59 ml/min ([facility]) 06/20/2016  . Low back pain 04/30/2014  . Constipation 06/06/2013  . Overweight (BMI 25.0-29.9) 05/31/2013  . S/P left TK revision 05/30/2013  . GASTRITIS 02/05/2010  . Osteopenia 06/28/2009  . COMPUTERIZED TOMOGRAPHY, CHEST, ABNORMAL 06/21/2009  . Prediabetes 11/06/2008  . EROSIVE ESOPHAGITIS 11/12/2007  . PUD 11/12/2007  . GERD 08/20/2007  . OSTEOARTHRITIS 08/20/2007  . Hyperlipidemia 08/11/2007  . HYPERTENSION, BENIGN 07/30/2007    Past Medical History:  Diagnosis Date  . Erosive  esophagitis   . Gastric ulcer   . Gastritis   . GERD (gastroesophageal reflux disease)   . HLD (hyperlipidemia)   . HTN (hypertension)   . Osteoarthritis   . Osteopenia   . Prediabetes    pt does not check cbg at home    Past Surgical History:  Procedure Laterality Date  . APPENDECTOMY  yrs ago  . arthroscopic knee surgery Left yrs ago  . benign nodule removed from neck  05-21-2013  . EYE SURGERY Bilateral 9- 9-19-2011and 07-2010   cataract lens replacement  . KNEE SURGERY Left 05/2009   to cut nerves  . left knee replacement  09-30-2005  . nuclear cardiolyte  10/08  . right knee replacement Right 08-10-2013  . right shoulder surgery  2005  . TOTAL KNEE REVISION Left 05/30/2013   Procedure: LEFT TOTAL KNEE REVISION;  Surgeon: Mauri Pole, MD;  Location: [facility];  Service: Orthopedics;  Laterality: Left;    Social History   Socioeconomic History  . Marital status: Widowed    Spouse name: Not on file  . Number of children: Not on file  . Years of education: Not on file  . Highest education level: Not on file  Social Needs  . Financial resource strain: Not on file  . Food insecurity - worry: Not on file  . Food insecurity - inability: Not on file  . Transportation needs - medical: Not on file  . Transportation needs - non-medical: Not on file  Occupational History  . Not on file  Tobacco Use  . Smoking status: Never Smoker  . Smokeless tobacco: Never Used  Substance and Sexual Activity  . Alcohol use: No    Alcohol/week: 0.0 oz  . Drug use: No  . Sexual activity: Not on file  Other Topics Concern  . Not on file  Social History Narrative   Full code, has living will, HCPOA children (reviewed 05/2014)    Family History  Problem Relation Age of Onset  . Coronary artery disease Father 19  . Hypertension Sister        x 4  . Breast cancer Sister 48  . Breast cancer Daughter 54    No Known Allergies  Medication list reviewed and updated in full in [place].   GEN: No acute illnesses, no fevers, chills. GI: No n/v/d, eating normally Pulm: No SOB Interactive and getting along well at home.  Otherwise, ROS is as per the HPI.  Objective:   BP 140/70   Pulse 60   Temp 97.7 F (36.5 C) (Oral)   Ht 5\' 4"  (1.626 m)   Wt 154 lb 4 oz (70 kg)   BMI 26.48 kg/m   GEN: WDWN, NAD, Non-toxic, A & O x 3 HEENT: Atraumatic, Normocephalic. Neck supple. No masses, No LAD. Ears and Nose: No external deformity. CV: RRR, No M/G/R. No JVD. No thrill. No extra heart sounds. PULM: CTA B, no wheezes, crackles, rhonchi. No retractions. No resp. distress. No accessory muscle use. EXTR: No c/c/1+ LE edema NEURO Normal gait.  PSYCH: Normally interactive. Conversant. Not depressed or anxious appearing.  Calm demeanor.   The left leg is extensively pink to reddish with some scaling skin and on the dorsum of the flexor of the dorsal flexure of the ankle there is extensive scab.  It is not significantly tender to palpation, but it goes up throughout at least 75% of the lower extremity.  I marked the edges with permanent marker.  Laboratory and Imaging Data:  Assessment and Plan:   Cellulitis of left leg - Plan: cefTRIAXone (ROCEPHIN) injection 1 g  Given the extent of the cellulitis, and to broaden coverage for strep species as well as MSSA, I gave this patient some IM Rocephin in the office.  I am going to have the patient discontinue doxycycline, and continue with oral Keflex for strep and MSSA coverage.  Reasonable to continue MRSA coverage as well, so we will continue Septra.  The patient is going to follow-up with my partner Dr. Diona Browner on Thursday   Her primary care doctor for a recheck. If worsens despite all of this, then she may need IV ABX.  Follow-up: No Follow-up on file.  Meds ordered this encounter  Medications  . cephALEXin (KEFLEX) 500 MG capsule    Sig: Take 2 capsules (1,000 mg total) by mouth 2 (two) times daily.    Dispense:  40  capsule    Refill:  0  . cefTRIAXone (ROCEPHIN) injection 1 g    Order Specific Question:   Antibiotic Indication:    Answer:   Cellulitis   Medications Discontinued During This Encounter  Medication Reason  . doxycycline (VIBRA-TABS) 100 MG tablet    Signed,  Chidera Thivierge T. Mataya Kilduff, MD   Allergies as of 01/04/2018   No Known Allergies     Medication List        Accurate as of 01/04/18 11:59 PM. Always use your most recent med list.          aspirin 81  MG tablet Take 81 mg by mouth daily.   CALTRATE 600+D 600-400 MG-UNIT tablet Generic drug:  Calcium Carbonate-Vitamin D Take 1 tablet by mouth 2 (two) times daily.   cephALEXin 500 MG capsule Commonly known as:  KEFLEX Take 2 capsules (1,000 mg total) by mouth 2 (two) times daily.   dicyclomine 10 MG capsule Commonly known as:  BENTYL Take 10 mg by mouth 3 (three) times daily as needed (abdomen pain).   fish oil-omega-3 fatty acids 1000 MG capsule Take 2 g by mouth 2 (two) times daily.   furosemide 20 MG tablet Commonly known as:  LASIX Take 1 tablet (20 mg total) by mouth daily.   Glucosamine Sulfate 500 MG Caps Take 500 mg by mouth 2 (two) times daily.   HYDROcodone-acetaminophen 7.5-325 MG tablet Commonly known as:  NORCO Take 0.5 to 1 tab twice a day if needed.   hydroxypropyl methylcellulose / hypromellose 2.5 % ophthalmic solution Commonly known as:  ISOPTO TEARS / GONIOVISC Place 1 drop into both eyes 3 (three) times daily as needed (dry eyes).   lisinopril-hydrochlorothiazide 20-25 MG tablet Commonly known as:  PRINZIDE,ZESTORETIC TAKE 1 TABLET EVERY MORNING   metoprolol tartrate 50 MG tablet Commonly known as:  LOPRESSOR TAKE 1 TABLET TWICE DAILY (TAKE WITH 25MG  TABLET FOR A TOTAL OF 75MG  TWICE DAILY)   metoprolol tartrate 25 MG tablet Commonly known as:  LOPRESSOR TAKE 1 TABLET TWICE DAILY (TAKE WITH 50MG  TABLET FOR A TOTAL OF 75MG  TWICE DAILY)   multivitamin tablet Take 1 tablet by mouth  daily.   omeprazole 40 MG capsule Commonly known as:  PRILOSEC TAKE 1 CAPSULE EVERY DAY   OVER THE COUNTER MEDICATION I caps 1 tab bid   oxyCODONE-acetaminophen 5-325 MG tablet Commonly known as:  PERCOCET/ROXICET Take 1 tablet by mouth every 6 (six) hours as needed.   sertraline 25 MG tablet Commonly known as:  ZOLOFT TAKE 1 TABLET BY MOUTH EVERY DAY   sulfamethoxazole-trimethoprim 800-160 MG tablet Commonly known as:  BACTRIM DS,SEPTRA DS Take 1 tablet by mouth 2 (two) times daily.   triamcinolone cream 0.5 % Commonly known as:  KENALOG Apply 1 application topically 2 (two) times daily.

## 2018-01-07 ENCOUNTER — Ambulatory Visit (INDEPENDENT_AMBULATORY_CARE_PROVIDER_SITE_OTHER): Payer: Medicare Other | Admitting: Family Medicine

## 2018-01-07 ENCOUNTER — Encounter: Payer: Self-pay | Admitting: Family Medicine

## 2018-01-07 DIAGNOSIS — L03116 Cellulitis of left lower limb: Secondary | ICD-10-CM

## 2018-01-07 DIAGNOSIS — L0390 Cellulitis, unspecified: Secondary | ICD-10-CM | POA: Insufficient documentation

## 2018-01-07 MED ORDER — BETAMETHASONE DIPROPIONATE 0.05 % EX CREA: TOPICAL_CREAM | Freq: Two times a day (BID) | CUTANEOUS | 0 refills | Status: DC

## 2018-01-07 MED ORDER — CEFTRIAXONE SODIUM 1 G IJ SOLR
1.0000 g | Freq: Once | INTRAMUSCULAR | Status: AC
  Administered 2018-01-07: 1 g via INTRAMUSCULAR

## 2018-01-07 NOTE — Patient Instructions (Addendum)
Start topical steroid cream twice  Daily. Complete oral antibiotics.  Go to ER if fever or not keeping down antibiotics.   Call if spreading redness, flu like symptoms.

## 2018-01-07 NOTE — Addendum Note (Signed)
Addended by: Carter Kitten on: 01/07/2018 11:52 AM   Modules accepted: Orders

## 2018-01-07 NOTE — Progress Notes (Signed)
Subjective:    Patient ID: [name], female    DOB: [birth year], 84 y.o.   MRN: [id number]  HPI   84 year old female presents for  follow up cellulitis on  Left leg.  Dx on 3/14 by  Webb Silversmith.. Redness and weeping started 1-2 weeks prior  Started  On doxy, septra x 10 days lasix 20 mg   Follow up on 3/18 by Dr. Lorelei Pont. Given rocephin.  Broadened to cover strep species with Keflex. Stopped doxy. continued MRSA coverage with Septra.    Today she reports mild improvement in redness, has not spread past black line.  She feels well otherwise. No fever, no flu like symptoms, no SOB.  No pain,  it is itchy.  no discharge, no weeping. No longer taking lasix... Only on x 3 days.   Blood pressure 130/70, pulse 76, temperature 97.7 F (36.5 C), temperature source Oral, height 5\' 4"  (1.626 m), weight 157 lb (71.2 kg).   Review of Systems  Constitutional: Negative for fatigue and fever.  HENT: Negative for congestion.   Eyes: Negative for pain.  Respiratory: Negative for cough and shortness of breath.   Cardiovascular: Negative for chest pain, palpitations and leg swelling.  Gastrointestinal: Negative for abdominal pain.  Genitourinary: Negative for dysuria and vaginal bleeding.  Musculoskeletal: Negative for back pain.  Neurological: Negative for syncope, light-headedness and headaches.  Psychiatric/Behavioral: Negative for dysphoric mood.       Objective:   Physical Exam  Constitutional: Vital signs are normal. She appears well-developed and well-nourished. She is cooperative.  Non-toxic appearance. She does not appear ill. No distress.  HENT:  Head: Normocephalic.  Right Ear: Hearing, tympanic membrane, external ear and ear canal normal. Tympanic membrane is not erythematous, not retracted and not bulging.  Left Ear: Hearing, tympanic membrane, external ear and ear canal normal. Tympanic membrane is not erythematous, not retracted and not bulging.  Nose: No mucosal  edema or rhinorrhea. Right sinus exhibits no maxillary sinus tenderness and no frontal sinus tenderness. Left sinus exhibits no maxillary sinus tenderness and no frontal sinus tenderness.  Mouth/Throat: Uvula is midline, oropharynx is clear and moist and mucous membranes are normal.  Eyes: Pupils are equal, round, and reactive to light. Conjunctivae, EOM and lids are normal. Lids are everted and swept, no foreign bodies found.  Neck: Trachea normal and normal range of motion. Neck supple. Carotid bruit is not present. No thyroid mass and no thyromegaly present.  Cardiovascular: Normal rate, regular rhythm, S1 normal, S2 normal, normal heart sounds, intact distal pulses and normal pulses. Exam reveals no gallop and no friction rub.  No murmur heard. Pulmonary/Chest: Effort normal and breath sounds normal. No tachypnea. No respiratory distress. She has no decreased breath sounds. She has no wheezes. She has no rhonchi. She has no rales.  Abdominal: Soft. Normal appearance and bowel sounds are normal. There is no tenderness.  Neurological: She is alert.  Skin: Skin is warm, dry and intact. No rash noted.  Left leg with redness and warmth, very dry skin, skin layers exfoliating in sheets at touch   Appears redness  In rund patches in places at Redness decreased back from red line  From 2 days before.  Psychiatric: Her speech is normal and behavior is normal. Judgment and thought content normal. Her mood appears not anxious. Cognition and memory are normal. She does not exhibit a depressed mood.   Also small patch of red dry flaky skin on left anterior leg  Assessment & Plan:

## 2018-01-07 NOTE — Assessment & Plan Note (Addendum)
Some improvement. Pt feels well otherwise, nontoxic.  Will repeat  ceftriaxone injection 1 g today. Continue cephalexin and septra.   Given appears there may be underlying skin rash/ dermatitis .Marland Kitchen Likely rash started and bacterial introduced secondarily.  Start topical steroid cream.  May need referral to derm.   return precautions given.  Close follow up in 4- 5 days

## 2018-01-12 ENCOUNTER — Ambulatory Visit (INDEPENDENT_AMBULATORY_CARE_PROVIDER_SITE_OTHER): Payer: Medicare Other | Admitting: Family Medicine

## 2018-01-12 ENCOUNTER — Encounter: Payer: Self-pay | Admitting: Family Medicine

## 2018-01-12 DIAGNOSIS — L989 Disorder of the skin and subcutaneous tissue, unspecified: Secondary | ICD-10-CM

## 2018-01-12 DIAGNOSIS — L03116 Cellulitis of left lower limb: Secondary | ICD-10-CM | POA: Diagnosis not present

## 2018-01-12 MED ORDER — TRIAMCINOLONE ACETONIDE 0.5 % EX CREA: 1.0000 "application " | TOPICAL_CREAM | Freq: Two times a day (BID) | CUTANEOUS | 0 refills | Status: DC

## 2018-01-12 NOTE — Assessment & Plan Note (Signed)
Significant improvement. Complete antibiotics.

## 2018-01-12 NOTE — Progress Notes (Signed)
   Subjective:    Patient ID: [name], female    DOB: 05/20/1925, 84 y.o.   MRN: [id number]  HPI   84 year old female presents for 5 day follow up on left leg cellulitis.  Following treatment with ceftriaxone x 2 doses, and bactrim and keflex.   Also started on topical steroid at last OV.   Today she reports: improvement in redness, no pain in leg, less flaky. HAs 2 more days of cepalexin.  She feels all right, no emesis.  No flu like.   Blood pressure 138/68, pulse 78, temperature 98 F (36.7 C), temperature source Oral, height 5\' 4"  (1.626 m), weight 153 lb 4 oz (69.5 kg).  Review of Systems  Constitutional: Negative for fatigue and fever.  HENT: Negative for ear pain.   Eyes: Negative for pain.  Respiratory: Negative for chest tightness and shortness of breath.   Cardiovascular: Negative for chest pain, palpitations and leg swelling.  Gastrointestinal: Negative for abdominal pain.  Genitourinary: Negative for dysuria.       Objective:   Physical Exam  Constitutional: Vital signs are normal. She appears well-developed and well-nourished. She is cooperative.  Non-toxic appearance. She does not appear ill. No distress.  HENT:  Head: Normocephalic.  Right Ear: Hearing, tympanic membrane, external ear and ear canal normal. Tympanic membrane is not erythematous, not retracted and not bulging.  Left Ear: Hearing, tympanic membrane, external ear and ear canal normal. Tympanic membrane is not erythematous, not retracted and not bulging.  Nose: No mucosal edema or rhinorrhea. Right sinus exhibits no maxillary sinus tenderness and no frontal sinus tenderness. Left sinus exhibits no maxillary sinus tenderness and no frontal sinus tenderness.  Mouth/Throat: Uvula is midline, oropharynx is clear and moist and mucous membranes are normal.  Eyes: Pupils are equal, round, and reactive to light. Conjunctivae, EOM and lids are normal. Lids are everted and swept, no foreign bodies  found.  Neck: Trachea normal and normal range of motion. Neck supple. Carotid bruit is not present. No thyroid mass and no thyromegaly present.  Cardiovascular: Normal rate, regular rhythm, S1 normal, S2 normal, normal heart sounds, intact distal pulses and normal pulses. Exam reveals no gallop and no friction rub.  No murmur heard. Pulmonary/Chest: Effort normal and breath sounds normal. No tachypnea. No respiratory distress. She has no decreased breath sounds. She has no wheezes. She has no rhonchi. She has no rales.  Abdominal: Soft. Normal appearance and bowel sounds are normal. There is no tenderness.  Neurological: She is alert.  Skin: Skin is warm, dry and intact. No rash noted.  Minimal redness and  minimal heat in left lower leg.  Less flaky skin, irritated skin decreased.  No swelling in left leg.  Psychiatric: Her speech is normal and behavior is normal. Judgment and thought content normal. Her mood appears not anxious. Cognition and memory are normal. She does not exhibit a depressed mood.          Assessment & Plan:

## 2018-01-12 NOTE — Assessment & Plan Note (Signed)
Rash on left leg improving with topical steroid cream... Continue 1-2 more weeks.

## 2018-01-12 NOTE — Patient Instructions (Signed)
Complete antibiotics.  Apply topical steroid cream twice daily for 1-2 weeks.  Call if redness of infection returns.

## 2018-01-19 DIAGNOSIS — H353213 Exudative age-related macular degeneration, right eye, with inactive scar: Secondary | ICD-10-CM | POA: Diagnosis not present

## 2018-01-19 DIAGNOSIS — H35413 Lattice degeneration of retina, bilateral: Secondary | ICD-10-CM | POA: Diagnosis not present

## 2018-01-19 DIAGNOSIS — H35421 Microcystoid degeneration of retina, right eye: Secondary | ICD-10-CM | POA: Diagnosis not present

## 2018-01-19 DIAGNOSIS — H353124 Nonexudative age-related macular degeneration, left eye, advanced atrophic with subfoveal involvement: Secondary | ICD-10-CM | POA: Diagnosis not present

## 2018-02-07 ENCOUNTER — Other Ambulatory Visit: Payer: Self-pay | Admitting: Family Medicine

## 2018-02-17 DIAGNOSIS — H6123 Impacted cerumen, bilateral: Secondary | ICD-10-CM | POA: Diagnosis not present

## 2018-05-05 ENCOUNTER — Other Ambulatory Visit: Payer: Self-pay | Admitting: Family Medicine

## 2018-06-24 ENCOUNTER — Ambulatory Visit: Payer: Medicare Other

## 2018-06-29 ENCOUNTER — Encounter: Payer: PRIVATE HEALTH INSURANCE | Admitting: Family Medicine

## 2018-07-07 ENCOUNTER — Ambulatory Visit: Payer: Self-pay | Admitting: *Deleted

## 2018-07-07 NOTE — Telephone Encounter (Signed)
Spoke with [name] daughter.  She thought her mom was stable to wait until her visit with Dr. Diona Browner tomorrow.  She will have her mom keep her foot/leg elevated today but does understand that if things changes and her mom starts running a fever, the redness spreads rapidly on her leg or she has severe SOB, she will need to take [name] to the ER.

## 2018-07-07 NOTE — Telephone Encounter (Signed)
Please call pt.. If fever and rapidly spreading redness in leg... She needs to be seen today  At our our office or at urgent care. IF SOB if severe... She needs to go to ER.

## 2018-07-07 NOTE — Telephone Encounter (Signed)
Pt's daughter, Thayer Headings, called stating that her mom has cellulitis on left leg (top of foot, ankle, and bottom of her leg; she says that it started 5 days ago and is swollen and reddened, she also describes it as "oozing" and "looking like it is going to scab over"; Thayer Headings said that the pt has a spot on right leg where she on the back of the steps; Thayer Headings says that the pt is also having shortness of breath for 8 months; recommendations made per nurse triage protocol to include seeing a physician within 24 hours; Thayer Headings requests that the pt be seen by Dr Diona Browner; her daughter accepts an appointment with Dr Diona Browner, [place], 07/08/18 at 1430; she verbalizes understanding; will route to office for notification of this upcoming appointment.    Reason for Disposition . [1] MODERATE leg swelling (e.g., swelling extends up to knees) AND [2] new onset or worsening  Answer Assessment - Initial Assessment Questions 1. ONSET: "When did the swelling start?" (e.g., minutes, hours, days)     07/03/18 2. LOCATION: "What part of the leg is swollen?"  "Are both legs swollen or just one leg?"     Left leg 3. SEVERITY: "How bad is the swelling?" (e.g., localized; mild, moderate, severe)  - Localized - small area of swelling localized to one leg  - MILD pedal edema - swelling limited to foot and ankle, pitting edema < 1/4 inch (6 mm) deep, rest and elevation eliminate most or all swelling  - MODERATE edema - swelling of lower leg to knee, pitting edema > 1/4 inch (6 mm) deep, rest and elevation only partially reduce swelling  - SEVERE edema - swelling extends above knee, facial or hand swelling present      moderate 4. REDNESS: "Does the swelling look red or infected?"     yes 5. PAIN: "Is the swelling painful to touch?" If so, ask: "How painful is it?"   (Scale 1-10; mild, moderate or severe)     Yes, rated severe  6. FEVER: "Do you have a fever?" If so, ask: "What is it, how was it measured, and when did it  start?"    no 7. CAUSE: "What do you think is causing the leg swelling?"     cellulitis 8. MEDICAL HISTORY: "Do you have a history of heart failure, kidney disease, liver failure, or cancer?"     no 9. RECURRENT SYMPTOM: "Have you had leg swelling before?" If so, ask: "When was the last time?" "What happened that time?"     Yes cellulitis March 2019 10. OTHER SYMPTOMS: "Do you have any other symptoms?" (e.g., chest pain, difficulty breathing)       Shortness of breath x 8 months 11. PREGNANCY: "Is there any chance you are pregnant?" "When was your last menstrual period?"       no  Protocols used: LEG SWELLING AND EDEMA-A-AH

## 2018-07-08 ENCOUNTER — Ambulatory Visit (INDEPENDENT_AMBULATORY_CARE_PROVIDER_SITE_OTHER)
Admission: RE | Admit: 2018-07-08 | Discharge: 2018-07-08 | Disposition: A | Discharge status: Home / Self Care | Payer: Medicare Other | Source: Ambulatory Visit | Attending: Family Medicine | Admitting: Family Medicine

## 2018-07-08 ENCOUNTER — Ambulatory Visit (INDEPENDENT_AMBULATORY_CARE_PROVIDER_SITE_OTHER): Payer: Medicare Other | Admitting: Family Medicine

## 2018-07-08 ENCOUNTER — Encounter: Payer: Self-pay | Admitting: Family Medicine

## 2018-07-08 VITALS — BP 180/80 | HR 83 | Temp 98.2°F | Ht 64.0 in | Wt 163.2 lb

## 2018-07-08 DIAGNOSIS — I10 Essential (primary) hypertension: Secondary | ICD-10-CM

## 2018-07-08 DIAGNOSIS — M109 Gout, unspecified: Secondary | ICD-10-CM | POA: Diagnosis not present

## 2018-07-08 DIAGNOSIS — R0600 Dyspnea, unspecified: Secondary | ICD-10-CM

## 2018-07-08 DIAGNOSIS — R0609 Other forms of dyspnea: Secondary | ICD-10-CM

## 2018-07-08 DIAGNOSIS — L03119 Cellulitis of unspecified part of limb: Secondary | ICD-10-CM

## 2018-07-08 DIAGNOSIS — M1A9XX0 Chronic gout, unspecified, without tophus (tophi): Secondary | ICD-10-CM | POA: Insufficient documentation

## 2018-07-08 MED ORDER — PREDNISONE 20 MG PO TABS: ORAL_TABLET | ORAL | 0 refills | Status: DC

## 2018-07-08 MED ORDER — CEFTRIAXONE SODIUM 1 G IJ SOLR
1.0000 g | Freq: Once | INTRAMUSCULAR | Status: AC
  Administered 2018-07-08: 1 g via INTRAMUSCULAR

## 2018-07-08 MED ORDER — DOXYCYCLINE HYCLATE 100 MG PO TABS: 100.0000 mg | ORAL_TABLET | Freq: Two times a day (BID) | ORAL | 0 refills | Status: DC

## 2018-07-08 NOTE — Assessment & Plan Note (Signed)
Last year eval unrevealing. Continues to worsen. Will eval with CXR.  Consider stress test if CXr negative.

## 2018-07-08 NOTE — Progress Notes (Signed)
Subjective:    Patient ID: [name], female    DOB: [birth year], 84 y.o.   MRN: [id number]  HPI 84 year old female  With CKD, HTN presents with new onset redness and swelling in left leg. She noted a rash apper ain last 4-5 days.. Scaly on left anterior leg.. Oozing clear fluid. Redness spreading up left leg. Also [place] right [place]  [place] the [place] 1 [place]... Now redness  Spreading around.  Both legs are also more swollen than usual. No fever, no chills, no N/V. No flu-like sypmtoms.   She has also been progressively more short of breath in last few months.  2018 DOE evaluated with labs ( nml thyroid, cbc) and ECHO EF 65-70%  EKG poor R wave progression and PACs. Nonsmoker, some secondhand smoke.  Mild cough, uses cough drops, no congestion. Wheeze some.  Has not yet had CXR.  Her BP is high today on lisinopril HCTZ, metoprolol 75 mg BID  Her sister passed away in last week.  BP Readings from Last 3 Encounters:  07/08/18 (!) 180/80  01/12/18 138/68  01/07/18 130/70    Hx of left leg cellulitis: last treated in 3.2019 with rocephin and cephalexin and septra.  resolved.   She feels she has a gout flare in right ankle as well.. Sore and swollen x 2-3 weeks.  GFR 57  Social History /Family History/Past Medical History reviewed in detail and updated in EMR if needed. Blood pressure (!) 180/80, pulse 83, temperature 98.2 F (36.8 C), temperature source Oral, height 5\' 4"  (1.626 m), weight 163 lb 4 oz (74 kg), SpO2 95 %.  Review of Systems  Constitutional: Negative for fatigue.  HENT: Negative for ear pain.   Eyes: Negative for pain.  Respiratory: Positive for cough and shortness of breath.   Cardiovascular: Positive for leg swelling. Negative for chest pain and palpitations.  Genitourinary: Negative for dysuria.  Neurological: Negative for dizziness.       Objective:   Physical Exam  Constitutional: Vital signs are normal. She appears well-developed and  well-nourished. She is cooperative.  Non-toxic appearance. She does not appear ill. No distress.  Elderly female in NAD  HENT:  Head: Normocephalic.  Right Ear: Hearing, tympanic membrane, external ear and ear canal normal. Tympanic membrane is not erythematous, not retracted and not bulging.  Left Ear: Hearing, tympanic membrane, external ear and ear canal normal. Tympanic membrane is not erythematous, not retracted and not bulging.  Nose: No mucosal edema or rhinorrhea. Right sinus exhibits no maxillary sinus tenderness and no frontal sinus tenderness. Left sinus exhibits no maxillary sinus tenderness and no frontal sinus tenderness.  Mouth/Throat: Uvula is midline, oropharynx is clear and moist and mucous membranes are normal.  Eyes: Pupils are equal, round, and reactive to light. Conjunctivae, EOM and lids are normal. Lids are everted and swept, no foreign bodies found.  Neck: Trachea normal and normal range of motion. Neck supple. Carotid bruit is not present. No thyroid mass and no thyromegaly present.  Cardiovascular: Normal rate, regular rhythm, S1 normal, S2 normal, normal heart sounds, intact distal pulses and normal pulses. Exam reveals no gallop and no friction rub.  No murmur heard.  Bilateral 1 plus pitting edema  Pulmonary/Chest: Effort normal and breath sounds normal. No tachypnea. No respiratory distress. She has no decreased breath sounds. She has no wheezes. She has no rhonchi. She has no rales.  Abdominal: Soft. Normal appearance and bowel sounds are normal. There is no  tenderness.  Musculoskeletal:       Right ankle: She exhibits decreased range of motion and swelling. Tenderness.  Neurological: She is alert.  Skin: Skin is warm, dry and intact. No rash noted.  Left anterior calf with dried discharge and flaky skin, left lower leg red and hot  right posterior calf with quarter size abrasion, surrounding erythema in posterior calf , less so throughput right lower leg    Psychiatric: Her speech is normal and behavior is normal. Judgment and thought content normal. Her mood appears not anxious. Cognition and memory are normal. She does not exhibit a depressed mood.          Assessment & Plan:

## 2018-07-08 NOTE — Patient Instructions (Addendum)
Stop  for CXR on way out.  Follow BP at home over the next week.. Call with measurements.. Call sooner if 3 measurements above 140/90.  Complete prednisone taper for gout.  Start antibiotics for infection and complete course.  Elevate both legs and keep open wounds covered.

## 2018-07-08 NOTE — Addendum Note (Signed)
Addended by: Carter Kitten on: 07/08/2018 03:37 PM   Modules accepted: Orders

## 2018-07-08 NOTE — Assessment & Plan Note (Signed)
Bilateral legs red.. Likely from swelling, but increased redness and warmth from possible cellulitis in right posterior calf.  Recurrence of rash/discharge from left anterior lower leg.. Treat with rocephin and MRSA coverage with doxy.  Close follow up in 1 week.  Elevate legs.

## 2018-07-08 NOTE — Assessment & Plan Note (Signed)
Dx by different MD.? Uric acid.  pt with sore ankle.  Will treat with prednisone.  Eval uric acid next lab eval. MAy need allopurinol preventative.

## 2018-07-08 NOTE — Assessment & Plan Note (Signed)
Poor control.. Sister recently passed away. Daughter will follow at home .Marland Kitchen Call if elevated > 140/90.  Recheck next week.  Pt compliant with meds, previously was well controlled on current regimen.

## 2018-07-09 DIAGNOSIS — H6123 Impacted cerumen, bilateral: Secondary | ICD-10-CM | POA: Diagnosis not present

## 2018-07-16 ENCOUNTER — Encounter: Payer: Self-pay | Admitting: Family Medicine

## 2018-07-16 ENCOUNTER — Ambulatory Visit (INDEPENDENT_AMBULATORY_CARE_PROVIDER_SITE_OTHER): Payer: Medicare Other | Admitting: Family Medicine

## 2018-07-16 VITALS — BP 130/60 | HR 61 | Temp 97.7°F | Ht 64.0 in | Wt 158.8 lb

## 2018-07-16 DIAGNOSIS — L989 Disorder of the skin and subcutaneous tissue, unspecified: Secondary | ICD-10-CM

## 2018-07-16 DIAGNOSIS — R7303 Prediabetes: Secondary | ICD-10-CM | POA: Diagnosis not present

## 2018-07-16 DIAGNOSIS — M8580 Other specified disorders of bone density and structure, unspecified site: Secondary | ICD-10-CM

## 2018-07-16 DIAGNOSIS — L03119 Cellulitis of unspecified part of limb: Secondary | ICD-10-CM | POA: Diagnosis not present

## 2018-07-16 DIAGNOSIS — M109 Gout, unspecified: Secondary | ICD-10-CM | POA: Diagnosis not present

## 2018-07-16 DIAGNOSIS — N183 Chronic kidney disease, stage 3 unspecified: Secondary | ICD-10-CM

## 2018-07-16 DIAGNOSIS — R0609 Other forms of dyspnea: Secondary | ICD-10-CM | POA: Diagnosis not present

## 2018-07-16 DIAGNOSIS — I10 Essential (primary) hypertension: Secondary | ICD-10-CM

## 2018-07-16 DIAGNOSIS — R0600 Dyspnea, unspecified: Secondary | ICD-10-CM

## 2018-07-16 NOTE — Assessment & Plan Note (Signed)
Now good control on current regimen. Elevations at home.. Follow.

## 2018-07-16 NOTE — Assessment & Plan Note (Signed)
Improved with prednisone taper.. Foot nontender to palpation. At next lab check eval uric acid.

## 2018-07-16 NOTE — Assessment & Plan Note (Signed)
Resolved on antibiotics in both areas.

## 2018-07-16 NOTE — Assessment & Plan Note (Signed)
Neg labs eval initial cards eval neg, hold on stress test at this point pt will consider.  Neg pulm eval.  Most likely deconditioning.

## 2018-07-16 NOTE — Progress Notes (Signed)
Subjective:    Patient ID: [name], female    DOB: 16-Nov-1924, 84 y.o.   MRN: [id number]  HPI   84 year old female pt presents for follow up multiple issues.    1. Cellultis and rash left anterior leg:..  Rash decreased, now just flaky skin on leg, no discharge, no redness, no swelling.  2. Abrasion and cellulitis right posterior leg: Improved.. Less redness, no discharge. Gradually healing.  Continuing to wash and wrap with coban.   3. Gout right foot: She has noted  Pain improvement, pain of and on. Less swelling.  Not tender to touch.  4. Shortness of breath: Feel most short of breath when trying to do activities in last year.  EKG unremarkable 2018, No sign of heart failure per labs, thyroid nml, no anemia on labs in 2018 Nml ECHo 2018  nonsmoker CXR: no acute cardiopulmonary disease 06/2018  no cough, no wheeze or clear pulmonary cause.  5. Hypertension:  Well controlled on  Metoprolol and lisinopril HCTZ. BP Readings from Last 3 Encounters:  07/16/18 130/60  07/08/18 (!) 180/80  01/12/18 138/68   Wt Readings from Last 3 Encounters:  07/16/18 158 lb 12 oz (72 kg)  07/08/18 163 lb 4 oz (74 kg)  01/12/18 153 lb 4 oz (69.5 kg)     Blood pressure 130/60, pulse 61, temperature 97.7 F (36.5 C), temperature source Oral, height 5\' 4"  (1.626 m), weight 158 lb 12 oz (72 kg).  Review of Systems  Constitutional: Negative for fatigue and fever.  HENT: Negative for congestion.   Eyes: Negative for pain.  Respiratory: Negative for cough and shortness of breath.   Cardiovascular: Negative for chest pain, palpitations and leg swelling.  Gastrointestinal: Negative for abdominal pain.  Genitourinary: Negative for dysuria and vaginal bleeding.  Musculoskeletal: Negative for back pain.  Skin:       mulitple skin lesions she would like looked at by derm.  Neurological: Negative for syncope, light-headedness and headaches.  Psychiatric/Behavioral: Negative for dysphoric  mood.       Objective:   Physical Exam  Constitutional: Vital signs are normal. She appears well-developed and well-nourished. She is cooperative.  Non-toxic appearance. She does not appear ill. No distress.  Elderly female in NAD  HENT:  Head: Normocephalic.  Right Ear: Hearing, tympanic membrane, external ear and ear canal normal. Tympanic membrane is not erythematous, not retracted and not bulging.  Left Ear: Hearing, tympanic membrane, external ear and ear canal normal. Tympanic membrane is not erythematous, not retracted and not bulging.  Nose: Nose normal. No mucosal edema or rhinorrhea. Right sinus exhibits no maxillary sinus tenderness and no frontal sinus tenderness. Left sinus exhibits no maxillary sinus tenderness and no frontal sinus tenderness.  Mouth/Throat: Uvula is midline, oropharynx is clear and moist and mucous membranes are normal.  Eyes: Pupils are equal, round, and reactive to light. Conjunctivae, EOM and lids are normal. Lids are everted and swept, no foreign bodies found.  Neck: Trachea normal and normal range of motion. Neck supple. Carotid bruit is not present. No thyroid mass and no thyromegaly present.  Cardiovascular: Normal rate, regular rhythm, S1 normal, S2 normal, normal heart sounds, intact distal pulses and normal pulses. Exam reveals no gallop and no friction rub.  No murmur heard.  NO PERIPHERAL edema  Pulmonary/Chest: Effort normal and breath sounds normal. No tachypnea. No respiratory distress. She has no decreased breath sounds. She has no wheezes. She has no rhonchi. She has no rales.  Abdominal: Soft. Normal appearance and bowel sounds are normal. She exhibits no distension, no fluid wave, no abdominal bruit and no mass. There is no hepatosplenomegaly. There is no tenderness. There is no rebound, no guarding and no CVA tenderness. No hernia.  Musculoskeletal:       Right ankle: She exhibits decreased range of motion and swelling. Tenderness.    Lymphadenopathy:    She has no cervical adenopathy.    She has no axillary adenopathy.  Neurological: She is alert. She has normal strength. No cranial nerve deficit or sensory deficit.  Skin: Skin is warm, dry and intact. No rash noted.  Left anterior calf with dry flaky skin no redness or lesion remiaing,  right posterior calf with quarter size abrasion, NO surrounding erythema in posterior calf  No associated swelling  Psychiatric: Her speech is normal and behavior is normal. Judgment and thought content normal. Her mood appears not anxious. Cognition and memory are normal. She does not exhibit a depressed mood.          Assessment & Plan:

## 2018-07-16 NOTE — Patient Instructions (Addendum)
Complete antibiotics. Elevate legs when sitting.  Get back to walking some each day.  Use Cetaphil cream on lower legs for moisturizer.  Keep right leg wound covered unit closed, wash with warm soapy water.  Keep follow up as scheduled. Please stop at the front desk to set up referral.

## 2018-07-20 ENCOUNTER — Telehealth: Payer: Self-pay

## 2018-07-20 DIAGNOSIS — H35413 Lattice degeneration of retina, bilateral: Secondary | ICD-10-CM | POA: Diagnosis not present

## 2018-07-20 DIAGNOSIS — H353124 Nonexudative age-related macular degeneration, left eye, advanced atrophic with subfoveal involvement: Secondary | ICD-10-CM | POA: Diagnosis not present

## 2018-07-20 DIAGNOSIS — H35421 Microcystoid degeneration of retina, right eye: Secondary | ICD-10-CM | POA: Diagnosis not present

## 2018-07-20 DIAGNOSIS — H353213 Exudative age-related macular degeneration, right eye, with inactive scar: Secondary | ICD-10-CM | POA: Diagnosis not present

## 2018-07-20 NOTE — Telephone Encounter (Signed)
Left message asking Thayer Headings to call the office

## 2018-07-20 NOTE — Telephone Encounter (Signed)
PLEASE NOTE: All timestamps contained within this report are represented as [place] Standard Time. CONFIDENTIALTY NOTICE: This fax transmission is intended only for the addressee. It contains information that is legally privileged, confidential or otherwise protected from use or disclosure. If you are not the intended recipient, you are strictly prohibited from reviewing, disclosing, copying using or disseminating any of this information or taking any action in reliance on or regarding this information. If you have received this fax in error, please notify us immediately by telephone so that we can arrange for its return to [place]. Phone: [phone], Toll-Free: [phone], Fax: [phone] Page: 1 of 1 Call Id: [id number] [facility] - Client Nonclinical Telephone Record [facility] - Client Client Site [facility] [place] Physician Eliezer Lofts - MD Contact Type Call Who Is Calling Patient / Member / Family / Caregiver Caller Name Petina Muraski Caller Phone Number [phone] or [phone] Patient Name [name] Patient DOB Jul 22, 1925 Call Type Message Only Information Provided Reason for Call Request to Reschedule Office Appointment Initial Comment Caller states that she needs to reschedule her mothers appointment. Additional Comment Call Closed By: Fredderick Erb Transaction Date/Time: 07/20/2018 9:48:52 AM (ET)

## 2018-07-21 NOTE — Telephone Encounter (Signed)
R/s to 11/20 with lisa and bedsole 11/22

## 2018-07-22 DIAGNOSIS — L820 Inflamed seborrheic keratosis: Secondary | ICD-10-CM | POA: Diagnosis not present

## 2018-07-22 DIAGNOSIS — L570 Actinic keratosis: Secondary | ICD-10-CM | POA: Diagnosis not present

## 2018-07-22 DIAGNOSIS — X32XXXA Exposure to sunlight, initial encounter: Secondary | ICD-10-CM | POA: Diagnosis not present

## 2018-07-22 DIAGNOSIS — Z1283 Encounter for screening for malignant neoplasm of skin: Secondary | ICD-10-CM | POA: Diagnosis not present

## 2018-07-22 DIAGNOSIS — B078 Other viral warts: Secondary | ICD-10-CM | POA: Diagnosis not present

## 2018-07-22 DIAGNOSIS — D225 Melanocytic nevi of trunk: Secondary | ICD-10-CM | POA: Diagnosis not present

## 2018-07-29 ENCOUNTER — Ambulatory Visit: Payer: Medicare Other

## 2018-08-04 ENCOUNTER — Other Ambulatory Visit: Payer: Self-pay | Admitting: Family Medicine

## 2018-08-05 ENCOUNTER — Encounter: Payer: Medicare Other | Admitting: Family Medicine

## 2018-08-09 ENCOUNTER — Other Ambulatory Visit: Payer: Self-pay | Admitting: Family Medicine

## 2018-09-06 ENCOUNTER — Telehealth: Payer: Self-pay

## 2018-09-06 NOTE — Telephone Encounter (Signed)
Patient would like to get her flu shot when she comes in to see Katha Cabal for her AWV part 1 on Wednesday.  I told her that would be fine and I will route to [employer] as an [place].  Thanks.

## 2018-09-07 ENCOUNTER — Telehealth: Payer: Self-pay | Admitting: Family Medicine

## 2018-09-07 DIAGNOSIS — M8580 Other specified disorders of bone density and structure, unspecified site: Secondary | ICD-10-CM

## 2018-09-07 DIAGNOSIS — R7303 Prediabetes: Secondary | ICD-10-CM

## 2018-09-07 DIAGNOSIS — E559 Vitamin D deficiency, unspecified: Secondary | ICD-10-CM

## 2018-09-07 NOTE — Telephone Encounter (Signed)
-----   Message from Eustace Pen, LPN sent at 20/80/2233  1:11 PM EST ----- Regarding: Labs 11/20 Lab orders needed. Thank you.  Insurance:  [employer]

## 2018-09-08 ENCOUNTER — Other Ambulatory Visit: Payer: Self-pay

## 2018-09-08 ENCOUNTER — Ambulatory Visit (INDEPENDENT_AMBULATORY_CARE_PROVIDER_SITE_OTHER): Payer: Medicare Other

## 2018-09-08 VITALS — BP 140/78 | HR 60 | Temp 98.1°F | Ht 64.0 in | Wt 161.2 lb

## 2018-09-08 DIAGNOSIS — R7303 Prediabetes: Secondary | ICD-10-CM | POA: Diagnosis not present

## 2018-09-08 DIAGNOSIS — Z23 Encounter for immunization: Secondary | ICD-10-CM

## 2018-09-08 DIAGNOSIS — Z0000 Encounter for general adult medical examination without abnormal findings: Secondary | ICD-10-CM

## 2018-09-08 DIAGNOSIS — E559 Vitamin D deficiency, unspecified: Secondary | ICD-10-CM | POA: Diagnosis not present

## 2018-09-08 LAB — COMPREHENSIVE METABOLIC PANEL
ALT: 9 U/L (ref 0–35)
AST: 16 U/L (ref 0–37)
Albumin: 3.9 g/dL (ref 3.5–5.2)
Alkaline Phosphatase: 46 U/L (ref 39–117)
BUN: 24 mg/dL — ABNORMAL HIGH (ref 6–23)
CO2: 26 mEq/L (ref 19–32)
Calcium: 9.4 mg/dL (ref 8.4–10.5)
Chloride: 105 mEq/L (ref 96–112)
Creatinine, Ser: 0.96 mg/dL (ref 0.40–1.20)
GFR: 57.91 mL/min — ABNORMAL LOW (ref >60.00)
Glucose, Bld: 98 mg/dL (ref 70–99)
Potassium: 4.1 mEq/L (ref 3.5–5.1)
Sodium: 139 mEq/L (ref 135–145)
Total Bilirubin: 0.4 mg/dL (ref 0.2–1.2)
Total Protein: 6.9 g/dL (ref 6.0–8.3)

## 2018-09-08 LAB — VITAMIN D 25 HYDROXY (VIT D DEFICIENCY, FRACTURES): VITD: 29.68 ng/mL — ABNORMAL LOW (ref 30.00–100.00)

## 2018-09-08 LAB — HEMOGLOBIN A1C: Hgb A1c MFr Bld: 6 % (ref 4.6–6.5)

## 2018-09-08 NOTE — Patient Instructions (Signed)
[name] , Thank you for taking time to come for your Medicare Wellness Visit. I appreciate your ongoing commitment to your health goals. Please review the following plan we discussed and let me know if I can assist you in the future.   These are the goals we discussed: Goals    . Increase water intake     Starting 09/08/2018, I will attempt to drink 6-8 glasses of water daily.        This is a list of the screening recommended for you and due dates:  Health Maintenance  Topic Date Due  . Tetanus Vaccine  11/06/2018  . Flu Shot  Completed  . DEXA scan (bone density measurement)  Completed  . Pneumonia vaccines  Completed   Preventive Care for Adults  A healthy lifestyle and preventive care can promote health and wellness. Preventive health guidelines for adults include the following key practices.  . A routine yearly physical is a good way to check with your health care provider about your health and preventive screening. It is a chance to share any concerns and updates on your health and to receive a thorough exam.  . Visit your dentist for a routine exam and preventive care every 6 months. Brush your teeth twice a day and floss once a day. Good oral hygiene prevents tooth decay and gum disease.  . The frequency of eye exams is based on your age, health, family medical history, use  of contact lenses, and other factors. Follow your health care provider's recommendations for frequency of eye exams.  . Eat a healthy diet. Foods like vegetables, fruits, whole grains, low-fat dairy products, and lean protein foods contain the nutrients you need without too many calories. Decrease your intake of foods high in solid fats, added sugars, and salt. Eat the right amount of calories for you. Get information about a proper diet from your health care provider, if necessary.  . Regular physical exercise is one of the most important things you can do for your health. Most adults should get at  least 150 minutes of moderate-intensity exercise (any activity that increases your heart rate and causes you to sweat) each week. In addition, most adults need muscle-strengthening exercises on 2 or more days a week.  Silver Sneakers may be a benefit available to you. To determine eligibility, you may visit the website: www.silversneakers.com or contact program at (203)795-1477 Mon-Fri between 8AM-8PM.   . Maintain a healthy weight. The body mass index (BMI) is a screening tool to identify possible weight problems. It provides an estimate of body fat based on height and weight. Your health care provider can find your BMI and can help you achieve or maintain a healthy weight.   For adults 20 years and older: ? A BMI below 18.5 is considered underweight. ? A BMI of 18.5 to 24.9 is normal. ? A BMI of 25 to 29.9 is considered overweight. ? A BMI of 30 and above is considered obese.   . Maintain normal blood lipids and cholesterol levels by exercising and minimizing your intake of saturated fat. Eat a balanced diet with plenty of fruit and vegetables. Blood tests for lipids and cholesterol should begin at age 57 and be repeated every 5 years. If your lipid or cholesterol levels are high, you are over 50, or you are at high risk for heart disease, you may need your cholesterol levels checked more frequently. Ongoing high lipid and cholesterol levels should be treated with medicines if  diet and exercise are not working.  . If you smoke, find out from your health care provider how to quit. If you do not use tobacco, please do not start.  . If you choose to drink alcohol, please do not consume more than 2 drinks per day. One drink is considered to be 12 ounces (355 mL) of beer, 5 ounces (148 mL) of wine, or 1.5 ounces (44 mL) of liquor.  . If you are 41-18 years old, ask your health care provider if you should take aspirin to prevent strokes.  . Use sunscreen. Apply sunscreen liberally and repeatedly  throughout the day. You should seek shade when your shadow is shorter than you. Protect yourself by wearing long sleeves, pants, a wide-brimmed hat, and sunglasses year round, whenever you are outdoors.  . Once a month, do a whole body skin exam, using a mirror to look at the skin on your back. Tell your health care provider of new moles, moles that have irregular borders, moles that are larger than a pencil eraser, or moles that have changed in shape or color.

## 2018-09-08 NOTE — Progress Notes (Signed)
PCP notes:   Health maintenance:  Flu vaccine - administered  Abnormal screenings:   None  Patient concerns:   None  Nurse concerns:  None  Next PCP appt:   09/10/18 @ 1000

## 2018-09-08 NOTE — Progress Notes (Signed)
Subjective:   [name] is a 84 y.o. female who presents for Medicare Annual (Subsequent) preventive examination.  Review of Systems:  N/A Cardiac Risk Factors include: advanced age (>25men, >44 women);dyslipidemia;hypertension     Objective:     Vitals: BP 140/78 (BP Location: Right Arm, Patient Position: Sitting, Cuff Size: Normal)   Pulse 60   Temp 98.1 F (36.7 C) (Oral)   Ht 5\' 4"  (1.626 m) Comment: shoes  Wt 161 lb 4 oz (73.1 kg)   SpO2 95%   BMI 27.68 kg/m   Body mass index is 27.68 kg/m.  Advanced Directives 09/08/2018 06/23/2017 06/20/2016 05/30/2013 05/24/2013  Does Patient Have a Medical Advance Directive? Yes Yes Yes Patient has advance directive, copy in chart Patient has advance directive, copy in chart  Type of Advance Directive Living will Living will [place];Living will [place];Living will [place];Living will  Does patient want to make changes to medical advance directive? - - No - Patient declined - -  Copy of [place] in Chart? - - No - copy requested - -  Pre-existing out of facility DNR order (yellow form or pink MOST form) - - - No No    Tobacco Social History   Tobacco Use  Smoking Status Never Smoker  Smokeless Tobacco Never Used     Counseling given: No   Clinical Intake:  Pre-visit preparation completed: Yes  Pain : No/denies pain Pain Score: 0-No pain     Nutritional Status: BMI 25 -29 Overweight Nutritional Risks: None Diabetes: No  How often do you need to have someone help you when you read instructions, pamphlets, or other written materials from your doctor or pharmacy?: 1 - Never What is the last grade level you completed in school?: 12th grade  Interpreter Needed?: No  Comments: pt is a widow and lives alone Information entered by :: LPinson, LPN  Past Medical History:  Diagnosis Date  . Erosive esophagitis   . Gastric ulcer   .  Gastritis   . GERD (gastroesophageal reflux disease)   . HLD (hyperlipidemia)   . HTN (hypertension)   . Osteoarthritis   . Osteopenia   . Prediabetes    pt does not check cbg at home   Past Surgical History:  Procedure Laterality Date  . APPENDECTOMY  yrs ago  . arthroscopic knee surgery Left yrs ago  . benign nodule removed from neck  05-21-2013  . EYE SURGERY Bilateral 9- 9-19-2011and 07-2010   cataract lens replacement  . KNEE SURGERY Left 05/2009   to cut nerves  . left knee replacement  09-30-2005  . nuclear cardiolyte  10/08  . right knee replacement Right 08-10-2013  . right shoulder surgery  2005  . TOTAL KNEE REVISION Left 05/30/2013   Procedure: LEFT TOTAL KNEE REVISION;  Surgeon: Mauri Pole, MD;  Location: [facility];  Service: Orthopedics;  Laterality: Left;   Family History  Problem Relation Age of Onset  . Coronary artery disease Father 46  . Hypertension Sister        x 4  . Breast cancer Sister 77  . Breast cancer Daughter 74   Social History   Socioeconomic History  . Marital status: Widowed    Spouse name: Not on file  . Number of children: Not on file  . Years of education: Not on file  . Highest education level: Not on file  Occupational History  . Not on  file  Social Needs  . Financial resource strain: Not on file  . Food insecurity:    Worry: Not on file    Inability: Not on file  . Transportation needs:    Medical: Not on file    Non-medical: Not on file  Tobacco Use  . Smoking status: Never Smoker  . Smokeless tobacco: Never Used  Substance and Sexual Activity  . Alcohol use: No    Alcohol/week: 0.0 standard drinks  . Drug use: No  . Sexual activity: Not Currently  Lifestyle  . Physical activity:    Days per week: Not on file    Minutes per session: Not on file  . Stress: Not on file  Relationships  . Social connections:    Talks on phone: Not on file    Gets together: Not on file    Attends religious service: Not on file     Active member of club or organization: Not on file    Attends meetings of clubs or organizations: Not on file    Relationship status: Not on file  Other Topics Concern  . Not on file  Social History Narrative   Full code, has living will, HCPOA children (reviewed 05/2014)    Outpatient Encounter Medications as of 09/08/2018  Medication Sig  . aspirin 81 MG tablet Take 81 mg by mouth daily.  . betamethasone dipropionate (DIPROLENE) 0.05 % cream Apply topically 2 (two) times daily.  Marland Kitchen dicyclomine (BENTYL) 10 MG capsule Take 10 mg by mouth 3 (three) times daily as needed (abdomen pain).  Marland Kitchen doxycycline (VIBRA-TABS) 100 MG tablet Take 1 tablet (100 mg total) by mouth 2 (two) times daily.  . fish oil-omega-3 fatty acids 1000 MG capsule Take 2 g by mouth 2 (two) times daily.   . Glucosamine Sulfate 500 MG CAPS Take 500 mg by mouth 2 (two) times daily.   . hydroxypropyl methylcellulose (ISOPTO TEARS) 2.5 % ophthalmic solution Place 1 drop into both eyes 3 (three) times daily as needed (dry eyes).  Marland Kitchen lisinopril-hydrochlorothiazide (PRINZIDE,ZESTORETIC) 20-25 MG tablet TAKE 1 TABLET EVERY MORNING  . metoprolol tartrate (LOPRESSOR) 25 MG tablet TAKE 1 TABLET TWICE DAILY (TAKE WITH 50MG  TABLET FOR A TOTAL OF 75MG  TWICE DAILY)  . metoprolol tartrate (LOPRESSOR) 50 MG tablet TAKE 1 TABLET TWICE DAILY (TAKE WITH 25MG  TABLET FOR A TOTAL OF 75MG  TWICE DAILY)  . Multiple Vitamin (MULTIVITAMIN) tablet Take 1 tablet by mouth daily.    Marland Kitchen omeprazole (PRILOSEC) 40 MG capsule TAKE 1 CAPSULE EVERY DAY  . OVER THE COUNTER MEDICATION I caps 1 tab bid  . sertraline (ZOLOFT) 25 MG tablet TAKE 1 TABLET BY MOUTH EVERY DAY  . triamcinolone cream (KENALOG) 0.5 % Apply 1 application topically 2 (two) times daily.   No facility-administered encounter medications on file as of 09/08/2018.     Activities of Daily Living In your present state of health, do you have any difficulty performing the following activities:  09/08/2018  Hearing? Y  Comment hearing aids  Vision? Y  Comment macular degeneration  Difficulty concentrating or making decisions? Y  Comment memory concerns  Walking or climbing stairs? Y  Comment SOB   Dressing or bathing? N  Doing errands, shopping? Y  Comment does not Physiological scientist and eating ? Y  Using the Toilet? N  In the past six months, have you accidently leaked urine? Y  Do you have problems with loss of bowel control? N  Managing your Medications? Darreld Mclean  Managing your Finances? Y  Housekeeping or managing your Housekeeping? Y  Some recent data might be hidden    Patient Care Team: Jinny Sanders, MD as PCP - General    Assessment:   This is a routine wellness examination for [facility].  Hearing Screening Comments: Bilateral hearing aids Vision Screening Comments: Vision in August 2019 with Dr. Tye Savoy.   Exercise Activities and Dietary recommendations Current Exercise Habits: The patient does not participate in regular exercise at present, Exercise limited by: None identified  Goals    . Increase water intake     Starting 09/08/2018, I will attempt to drink 6-8 glasses of water daily.        Fall Risk Fall Risk  09/08/2018 06/23/2017 06/20/2016 06/19/2015 06/13/2014  Falls in the past year? 0 No No No No   Depression Screen PHQ 2/9 Scores 09/08/2018 06/23/2017 04/03/2017 04/03/2017  PHQ - 2 Score 0 1 3 2   PHQ- 9 Score 0 1 8 3      Cognitive Function MMSE - Mini Mental State Exam 09/08/2018 06/23/2017  Orientation to time 5 5  Orientation to Place 5 5  Registration 3 3  Attention/ Calculation 0 0  Recall 3 3  Language- name 2 objects 0 0  Language- repeat 1 1  Language- follow 3 step command 3 3  Language- read & follow direction 0 0  Write a sentence 0 0  Copy design 0 0  Total score 20 20     PLEASE NOTE: A Mini-Cog screen was completed. Maximum score is 20. A value of 0 denotes this part of [place] MMSE was not completed or the patient failed  this part of the Mini-Cog screening.   Mini-Cog Screening Orientation to Time - Max 5 pts Orientation to Place - Max 5 pts Registration - Max 3 pts Recall - Max 3 pts Language Repeat - Max 1 pts Language Follow 3 Step Command - Max 3 pts     Immunization History  Administered Date(s) Administered  . H1N1 11/06/2008  . Influenza Split 07/22/2012  . Influenza Whole 07/21/2007, 07/20/2008, 08/20/2010  . Influenza,inj,Quad PF,6+ Mos 06/26/2017, 09/08/2018  . Influenza-Unspecified 07/20/2016  . Pneumococcal Conjugate-13 06/13/2014  . Pneumococcal Polysaccharide-23 10/20/1998  . Td 10/20/1998, 11/06/2008  . Zoster 10/20/2008   Screening Tests Health Maintenance  Topic Date Due  . TETANUS/TDAP  11/06/2018  . INFLUENZA VACCINE  Completed  . DEXA SCAN  Completed  . PNA vac Low Risk Adult  Completed      Plan:     I have personally reviewed, addressed, and noted the following in the patient's chart:  A. Medical and social history B. Use of alcohol, tobacco or illicit drugs  C. Current medications and supplements D. Functional ability and status E.  Nutritional status F.  Physical activity G. Advance directives H. List of other physicians I.  Hospitalizations, surgeries, and ER visits in previous 12 months J.  [place] to include hearing, vision, cognitive, depression L. Referrals and appointments - none  In addition, I have reviewed and discussed with patient certain preventive protocols, quality metrics, and best practice recommendations. A written personalized care plan for preventive services as well as general preventive health recommendations were provided to patient.  See attached scanned questionnaire for additional information.   Signed,   Lindell Noe, MHA, BS, LPN Health Coach

## 2018-09-10 ENCOUNTER — Encounter: Payer: Self-pay | Admitting: Family Medicine

## 2018-09-10 ENCOUNTER — Ambulatory Visit (INDEPENDENT_AMBULATORY_CARE_PROVIDER_SITE_OTHER): Payer: Medicare Other | Admitting: Family Medicine

## 2018-09-10 DIAGNOSIS — F320 Major depressive disorder, single episode, mild: Secondary | ICD-10-CM

## 2018-09-10 DIAGNOSIS — N183 Chronic kidney disease, stage 3 unspecified: Secondary | ICD-10-CM

## 2018-09-10 DIAGNOSIS — I10 Essential (primary) hypertension: Secondary | ICD-10-CM | POA: Diagnosis not present

## 2018-09-10 DIAGNOSIS — E559 Vitamin D deficiency, unspecified: Secondary | ICD-10-CM

## 2018-09-10 DIAGNOSIS — M109 Gout, unspecified: Secondary | ICD-10-CM

## 2018-09-10 DIAGNOSIS — R7303 Prediabetes: Secondary | ICD-10-CM

## 2018-09-10 NOTE — Assessment & Plan Note (Signed)
No recent flares 

## 2018-09-10 NOTE — Assessment & Plan Note (Signed)
Stable control. 

## 2018-09-10 NOTE — Assessment & Plan Note (Signed)
Replete with OTC

## 2018-09-10 NOTE — Assessment & Plan Note (Signed)
Well controlled. Continue current medication.  

## 2018-09-10 NOTE — Progress Notes (Addendum)
Subjective:    Patient ID: [name], female    DOB: 1924-10-31, 84 y.o.   MRN: [id number]  HPI  The patient presents for complete physical and review of chronic health problems. He/She also has the following acute concerns today:none  The patient saw Candis Musa, LPN for medicare wellness. Note reviewed in detail and important notes copied below.  Health maintenance:  Flu vaccine - administered  Abnormal screenings:   None  Patient concerns:   None  Nurse concerns:  None  09/10/18 Today:   left lower leg.. No infection or swelling but intermittent rash.. Using triamcinolone cream occ.  Hypertension:   Good control on lisinopril HCTZ, metoprolol   BP Readings from Last 3 Encounters:  09/10/18 120/70  09/08/18 140/78  07/16/18 130/60  Using medication without problems or lightheadedness: none Chest pain with exertion: none Edema: occ Short of breath: stable Average home BPs: Other issues:   prediabetes  Lab Results  Component Value Date   HGBA1C 6.0 09/08/2018    Gout  No flares recently.  Vit D def; replete  Social History /Family History/Past Medical History reviewed in detail and updated in EMR if needed. ,Blood pressure 120/70, pulse 60, temperature 98 F (36.7 C), temperature source Oral, height 5\' 4"  (1.626 m), weight 161 lb 8 oz (73.3 kg), SpO2 97 %.  Review of Systems  Constitutional: Negative for fatigue and fever.  HENT: Negative for congestion.   Eyes: Negative for pain.  Respiratory: Negative for cough and shortness of breath.   Cardiovascular: Negative for chest pain, palpitations and leg swelling.  Gastrointestinal: Negative for abdominal pain.  Genitourinary: Negative for dysuria and vaginal bleeding.  Musculoskeletal: Negative for back pain.  Neurological: Negative for syncope, light-headedness and headaches.  Psychiatric/Behavioral: Negative for dysphoric mood.       Objective:   Physical Exam    Constitutional: Vital signs are normal. She appears well-developed and well-nourished. She is cooperative.  Non-toxic appearance. She does not appear ill. No distress.  Elderly female in NAD  HENT:  Head: Normocephalic.  Right Ear: Hearing, tympanic membrane, external ear and ear canal normal. Tympanic membrane is not erythematous, not retracted and not bulging.  Left Ear: Hearing, tympanic membrane, external ear and ear canal normal. Tympanic membrane is not erythematous, not retracted and not bulging.  Nose: No mucosal edema or rhinorrhea. Right sinus exhibits no maxillary sinus tenderness and no frontal sinus tenderness. Left sinus exhibits no maxillary sinus tenderness and no frontal sinus tenderness.  Mouth/Throat: Uvula is midline, oropharynx is clear and moist and mucous membranes are normal.  Eyes: Pupils are equal, round, and reactive to light. Conjunctivae, EOM and lids are normal. Lids are everted and swept, no foreign bodies found.  Neck: Trachea normal and normal range of motion. Neck supple. Carotid bruit is not present. No thyroid mass and no thyromegaly present.  Cardiovascular: Normal rate, regular rhythm, S1 normal, S2 normal, normal heart sounds, intact distal pulses and normal pulses. Exam reveals no gallop and no friction rub.  No murmur heard.  Bilateral 1 plus pitting edema  Pulmonary/Chest: Effort normal and breath sounds normal. No tachypnea. No respiratory distress. She has no decreased breath sounds. She has no wheezes. She has no rhonchi. She has no rales.  Abdominal: Soft. Normal appearance and bowel sounds are normal. There is no tenderness.  Musculoskeletal:       Right ankle: She exhibits normal range of motion and no swelling. No tenderness.  Neurological: She is alert.  Skin: Skin is warm, dry and intact. No rash noted.  Left anterior calf with  Mild erythema and flaky skin macule.. No heat or dischrge  Psychiatric: Her speech is normal and behavior is normal.  Judgment and thought content normal. Her mood appears not anxious. Cognition and memory are normal. She does not exhibit a depressed mood.          Assessment & Plan:  The patient's preventative maintenance and recommended screening tests for an annual wellness exam were reviewed in full today. Brought up to date unless services declined.  Counselled on the importance of diet, exercise, and its role in overall health and mortality. The patient's FH and SH was reviewed, including their home life, tobacco status, and drug and alcohol status.   Vaccines: Uptodate with vaccines Td, PNA, prevnar, zostavax,  flu Colon: incomplete colonoscopy 2009.Marland Kitchen Not indicated given age. Pap/DVE not indicated given age 63: likes to continue, sister and 2 daughters with breast cancer. Last nml 10/2017, will plan every 2 years. Non smoker  DEXA: last in 2019 osteopenia stable

## 2018-09-10 NOTE — Patient Instructions (Addendum)
Restart triamcinolone cream as you have twice daily x 2 weeks to get it to resolve.  Start vit D 3 supplement 400IU twice daily.

## 2018-09-10 NOTE — Assessment & Plan Note (Signed)
Diet controlled and stable.

## 2018-09-10 NOTE — Assessment & Plan Note (Signed)
stable control on sertraline   Depression screen [facility] 2/9 09/08/2018 06/23/2017 04/03/2017  Decreased Interest 0 0 1  Down, Depressed, Hopeless 0 1 2  PHQ - 2 Score 0 1 3  Altered sleeping 0 0 0  Tired, decreased energy 0 0 2  Change in appetite 0 0 1  Feeling bad or failure about yourself  0 0 2  Trouble concentrating 0 0 0  Moving slowly or fidgety/restless 0 0 0  Suicidal thoughts 0 0 0  PHQ-9 Score 0 1 8  Difficult doing work/chores Not difficult at all Not difficult at all -

## 2018-09-20 NOTE — Progress Notes (Signed)
I reviewed health advisor's note, was available for consultation, and agree with documentation and plan.  

## 2018-10-14 ENCOUNTER — Other Ambulatory Visit: Payer: Self-pay | Admitting: Family Medicine

## 2018-10-14 DIAGNOSIS — Z1231 Encounter for screening mammogram for malignant neoplasm of breast: Secondary | ICD-10-CM

## 2018-11-04 DIAGNOSIS — H6123 Impacted cerumen, bilateral: Secondary | ICD-10-CM | POA: Diagnosis not present

## 2018-11-16 ENCOUNTER — Encounter: Payer: Self-pay | Admitting: Family Medicine

## 2018-11-16 ENCOUNTER — Ambulatory Visit (INDEPENDENT_AMBULATORY_CARE_PROVIDER_SITE_OTHER): Payer: Medicare Other | Admitting: Family Medicine

## 2018-11-16 VITALS — BP 184/82 | HR 69 | Temp 98.0°F | Ht 64.0 in | Wt 158.0 lb

## 2018-11-16 DIAGNOSIS — L03116 Cellulitis of left lower limb: Secondary | ICD-10-CM | POA: Diagnosis not present

## 2018-11-16 DIAGNOSIS — I10 Essential (primary) hypertension: Secondary | ICD-10-CM | POA: Diagnosis not present

## 2018-11-16 MED ORDER — CEFTRIAXONE SODIUM 1 G IJ SOLR
1.0000 g | Freq: Once | INTRAMUSCULAR | Status: AC
  Administered 2018-11-16: 1 g via INTRAMUSCULAR

## 2018-11-16 MED ORDER — DOXYCYCLINE HYCLATE 100 MG PO TABS: 100.0000 mg | ORAL_TABLET | Freq: Two times a day (BID) | ORAL | 0 refills | Status: DC

## 2018-11-16 NOTE — Progress Notes (Signed)
Subjective:     [name] is a 85 y.o. female presenting for Cellulitis (noticed it last week. Both legs and feet. Swelling ,pain, redness present. No fever.)     HPI   #Cellulitis - daughter noticed it this morning - but was present starting last week - no fevers or chills - left leg is painful - also endorses hx of gout  - thinks the gout is better (this is on the right foot) - has noticed some oozing -   #BP elevated - has been taking medication    Review of Systems  Eyes: Negative for visual disturbance.  Respiratory: Negative for shortness of breath.   Cardiovascular: Positive for leg swelling. Negative for chest pain and palpitations.  Skin: Positive for rash.  Neurological: Negative for headaches.    07/08/2018: Clinic - cellulitis - rocephin and doxy  Also in March 2019 - lasix, doxy, septra Rocephin and keflex when not improved, stopped doxy Started steroid cream for underlying rash  Social History   Tobacco Use  Smoking Status Never Smoker  Smokeless Tobacco Never Used        Objective:    BP Readings from Last 3 Encounters:  11/16/18 (!) 184/82  09/10/18 120/70  09/08/18 140/78   Wt Readings from Last 3 Encounters:  11/16/18 158 lb (71.7 kg)  09/10/18 161 lb 8 oz (73.3 kg)  09/08/18 161 lb 4 oz (73.1 kg)    BP (!) 184/82   Pulse 69   Temp 98 F (36.7 C)   Ht 5\' 4"  (1.626 m)   Wt 158 lb (71.7 kg)   SpO2 97%   BMI 27.12 kg/m    Physical Exam Constitutional:      General: She is not in acute distress.    Appearance: She is well-developed. She is not diaphoretic.  HENT:     Right Ear: External ear normal.     Left Ear: External ear normal.     Nose: Nose normal.  Eyes:     Conjunctiva/sclera: Conjunctivae normal.  Neck:     Musculoskeletal: Neck supple.  Cardiovascular:     Rate and Rhythm: Normal rate and regular rhythm.     Heart sounds: No murmur.  Pulmonary:     Effort: Pulmonary effort is normal. No respiratory  distress.     Breath sounds: Normal breath sounds. No wheezing or rales.  Skin:    General: Skin is warm and dry.     Capillary Refill: Capillary refill takes less than 2 seconds.     Comments: Bilateral legs with 2+ pitting edema to knee Left leg with scaling rash with surrounding erythema from ankle to the mid calf, slightly warm to the touch. Similar rash with small abrasion present on the right ankle See photos  Neurological:     Mental Status: She is alert. Mental status is at baseline.  Psychiatric:        Mood and Affect: Mood normal.        Behavior: Behavior normal.               Assessment & Plan:   Problem List Items Addressed This Visit      Cardiovascular and Mediastinum   HYPERTENSION, BENIGN    BP elevated. May have missed her evening medication, but did take it this morning. Home monitoring and will continue to follow in clinic. Suspect it is related to the cellulitis        Other   Cellulitis -  Primary    Recurrent issue for her with last episode 06/2018. Will use same antibiotics she previously responded too. Also with scaly rash which did not seem to resolved or has returned after topical steroids. Referral to dermatology for further evaluation. Will treated with antibiotics and recommend f/u in 2-3 days for check.       Relevant Medications   cefTRIAXone (ROCEPHIN) injection 1 g (Completed)   doxycycline (VIBRA-TABS) 100 MG tablet   Other Relevant Orders   Ambulatory referral to Dermatology       Return in about 2 days (around 11/18/2018) for skin check or 3 days.  Lesleigh Noe, MD

## 2018-11-16 NOTE — Assessment & Plan Note (Signed)
BP elevated. May have missed her evening medication, but did take it this morning. Home monitoring and will continue to follow in clinic. Suspect it is related to the cellulitis

## 2018-11-16 NOTE — Assessment & Plan Note (Signed)
Recurrent issue for her with last episode 06/2018. Will use same antibiotics she previously responded too. Also with scaly rash which did not seem to resolved or has returned after topical steroids. Referral to dermatology for further evaluation. Will treated with antibiotics and recommend f/u in 2-3 days for check.

## 2018-11-16 NOTE — Patient Instructions (Signed)
#   Cellulitis - Antibiotic shot today - Antibiotic sent to the pharmacy - Elevate your legs as much as possible -- sleep with them propped on a pillow  #Elevated blood pressure - check your blood pressure a few times a week - Call if still elevated - Goal <140/90  #Dermatology referral - to discuss your rash

## 2018-11-17 ENCOUNTER — Ambulatory Visit
Admission: RE | Admit: 2018-11-17 | Discharge: 2018-11-17 | Disposition: A | Discharge status: Home / Self Care | Payer: Medicare Other | Source: Ambulatory Visit | Attending: Family Medicine | Admitting: Family Medicine

## 2018-11-17 DIAGNOSIS — Z1231 Encounter for screening mammogram for malignant neoplasm of breast: Secondary | ICD-10-CM

## 2018-11-17 DIAGNOSIS — R21 Rash and other nonspecific skin eruption: Secondary | ICD-10-CM | POA: Diagnosis not present

## 2018-11-17 DIAGNOSIS — L853 Xerosis cutis: Secondary | ICD-10-CM | POA: Diagnosis not present

## 2018-11-17 DIAGNOSIS — L308 Other specified dermatitis: Secondary | ICD-10-CM | POA: Diagnosis not present

## 2018-11-18 ENCOUNTER — Other Ambulatory Visit: Payer: Self-pay | Admitting: Family Medicine

## 2018-11-23 ENCOUNTER — Encounter: Payer: Self-pay | Admitting: Family Medicine

## 2018-11-23 ENCOUNTER — Ambulatory Visit (INDEPENDENT_AMBULATORY_CARE_PROVIDER_SITE_OTHER): Payer: Medicare Other | Admitting: Family Medicine

## 2018-11-23 DIAGNOSIS — I10 Essential (primary) hypertension: Secondary | ICD-10-CM

## 2018-11-23 DIAGNOSIS — L989 Disorder of the skin and subcutaneous tissue, unspecified: Secondary | ICD-10-CM | POA: Diagnosis not present

## 2018-11-23 DIAGNOSIS — L03119 Cellulitis of unspecified part of limb: Secondary | ICD-10-CM | POA: Diagnosis not present

## 2018-11-23 NOTE — Patient Instructions (Signed)
Continue as planned per dermatology.  Call if fever on antibiotics or redness spreading.

## 2018-11-23 NOTE — Progress Notes (Signed)
Subjective:    Patient ID: [name], female    DOB: 08-25-1925, 85 y.o.   MRN: [id number]  HPI  85 year old female with chronic edema presents for follow up cellulitis.   She was seen  On 11/16/2018 by D.r Einar Pheasant. Treated with 1 g rocephin and doxycyline. She reports  She has noted some improvement in last few days.Marland Kitchen less discharge and scabs, slightly less  Redness.   No fever. She does not feel ill.  Referred to dermatology for underlying skin issues given recurrence and not better with topical steroids.. she has already been seen.  Culture obtained, pending.  Started on ointment... this has helped as well. Has  Imaging scheduled for blood flow issues.   He her BP was elevated on 1/28. But missed a dose of medication.  BP Readings from Last 3 Encounters:  11/23/18 120/62  11/16/18 (!) 184/82  09/10/18 120/70     Review of Systems  Constitutional: Negative for fatigue and fever.  HENT: Negative for congestion.   Eyes: Negative for pain.  Respiratory: Negative for cough and shortness of breath.   Cardiovascular: Negative for chest pain, palpitations and leg swelling.  Gastrointestinal: Negative for abdominal pain.  Genitourinary: Negative for dysuria and vaginal bleeding.  Musculoskeletal: Negative for back pain.  Neurological: Negative for syncope, light-headedness and headaches.  Psychiatric/Behavioral: Negative for dysphoric mood.       Objective:   Physical Exam Constitutional:      General: She is not in acute distress.    Appearance: Normal appearance. She is well-developed. She is not ill-appearing or toxic-appearing.  HENT:     Head: Normocephalic.     Right Ear: Hearing, tympanic membrane, ear canal and external ear normal. Tympanic membrane is not erythematous, retracted or bulging.     Left Ear: Hearing, tympanic membrane, ear canal and external ear normal. Tympanic membrane is not erythematous, retracted or bulging.     Nose: No mucosal edema or  rhinorrhea.     Right Sinus: No maxillary sinus tenderness or frontal sinus tenderness.     Left Sinus: No maxillary sinus tenderness or frontal sinus tenderness.     Mouth/Throat:     Pharynx: Uvula midline.  Eyes:     General: Lids are normal. Lids are everted, no foreign bodies appreciated.     Conjunctiva/sclera: Conjunctivae normal.     Pupils: Pupils are equal, round, and reactive to light.  Neck:     Musculoskeletal: Normal range of motion and neck supple.     Thyroid: No thyroid mass or thyromegaly.     Vascular: No carotid bruit.     Trachea: Trachea normal.  Cardiovascular:     Rate and Rhythm: Normal rate and regular rhythm.     Pulses: Normal pulses.     Heart sounds: Normal heart sounds, S1 normal and S2 normal. No murmur. No friction rub. No gallop.   Pulmonary:     Effort: Pulmonary effort is normal. No tachypnea or respiratory distress.     Breath sounds: Normal breath sounds. No decreased breath sounds, wheezing, rhonchi or rales.  Abdominal:     General: Bowel sounds are normal.     Palpations: Abdomen is soft.     Tenderness: There is no abdominal tenderness.  Skin:    General: Skin is warm and dry.     Findings: No rash.     Comments:  Erythema, no warmth and no discharge in left lower leg, small patch of redness  in right lower leg Flake and scabbing resolved..  Neurological:     Mental Status: She is alert.  Psychiatric:        Mood and Affect: Mood is not anxious or depressed.        Speech: Speech normal.        Behavior: Behavior normal. Behavior is cooperative.        Thought Content: Thought content normal.        Judgment: Judgment normal.           Assessment & Plan:

## 2018-11-23 NOTE — Assessment & Plan Note (Signed)
At goal on recheck with improvement in cellulitis and with taking med appropriately.

## 2018-11-23 NOTE — Assessment & Plan Note (Signed)
Underlying rash appears to be improving  With topical med provided by dermatology.  Await further results of testing.

## 2018-11-23 NOTE — Assessment & Plan Note (Signed)
Improving S/P antibiotics.

## 2018-11-25 ENCOUNTER — Other Ambulatory Visit: Payer: Self-pay | Admitting: Dermatology

## 2018-11-25 DIAGNOSIS — I878 Other specified disorders of veins: Secondary | ICD-10-CM

## 2018-11-25 DIAGNOSIS — I872 Venous insufficiency (chronic) (peripheral): Secondary | ICD-10-CM

## 2018-11-25 DIAGNOSIS — L0390 Cellulitis, unspecified: Secondary | ICD-10-CM

## 2018-12-01 DIAGNOSIS — L570 Actinic keratosis: Secondary | ICD-10-CM | POA: Diagnosis not present

## 2018-12-01 DIAGNOSIS — L814 Other melanin hyperpigmentation: Secondary | ICD-10-CM | POA: Diagnosis not present

## 2018-12-01 DIAGNOSIS — Z1283 Encounter for screening for malignant neoplasm of skin: Secondary | ICD-10-CM | POA: Diagnosis not present

## 2018-12-01 DIAGNOSIS — L219 Seborrheic dermatitis, unspecified: Secondary | ICD-10-CM | POA: Diagnosis not present

## 2018-12-01 DIAGNOSIS — L308 Other specified dermatitis: Secondary | ICD-10-CM | POA: Diagnosis not present

## 2018-12-01 DIAGNOSIS — R21 Rash and other nonspecific skin eruption: Secondary | ICD-10-CM | POA: Diagnosis not present

## 2018-12-01 DIAGNOSIS — L821 Other seborrheic keratosis: Secondary | ICD-10-CM | POA: Diagnosis not present

## 2018-12-03 DIAGNOSIS — H53413 Scotoma involving central area, bilateral: Secondary | ICD-10-CM | POA: Diagnosis not present

## 2018-12-06 ENCOUNTER — Ambulatory Visit
Admission: RE | Admit: 2018-12-06 | Discharge: 2018-12-06 | Disposition: A | Discharge status: Home / Self Care | Payer: Medicare Other | Source: Ambulatory Visit | Attending: Dermatology | Admitting: Dermatology

## 2018-12-06 DIAGNOSIS — I878 Other specified disorders of veins: Secondary | ICD-10-CM | POA: Insufficient documentation

## 2018-12-06 DIAGNOSIS — I872 Venous insufficiency (chronic) (peripheral): Secondary | ICD-10-CM | POA: Diagnosis not present

## 2018-12-06 DIAGNOSIS — L0390 Cellulitis, unspecified: Secondary | ICD-10-CM | POA: Diagnosis not present

## 2018-12-06 DIAGNOSIS — Z9189 Other specified personal risk factors, not elsewhere classified: Secondary | ICD-10-CM | POA: Diagnosis not present

## 2018-12-10 DIAGNOSIS — H53413 Scotoma involving central area, bilateral: Secondary | ICD-10-CM | POA: Diagnosis not present

## 2018-12-16 DIAGNOSIS — H53413 Scotoma involving central area, bilateral: Secondary | ICD-10-CM | POA: Diagnosis not present

## 2018-12-22 DIAGNOSIS — H53413 Scotoma involving central area, bilateral: Secondary | ICD-10-CM | POA: Diagnosis not present

## 2018-12-23 DIAGNOSIS — L308 Other specified dermatitis: Secondary | ICD-10-CM | POA: Diagnosis not present

## 2018-12-23 DIAGNOSIS — L570 Actinic keratosis: Secondary | ICD-10-CM | POA: Diagnosis not present

## 2018-12-23 DIAGNOSIS — Z872 Personal history of diseases of the skin and subcutaneous tissue: Secondary | ICD-10-CM | POA: Diagnosis not present

## 2018-12-28 DIAGNOSIS — H53413 Scotoma involving central area, bilateral: Secondary | ICD-10-CM | POA: Diagnosis not present

## 2019-01-06 DIAGNOSIS — H53413 Scotoma involving central area, bilateral: Secondary | ICD-10-CM | POA: Diagnosis not present

## 2019-02-02 ENCOUNTER — Other Ambulatory Visit: Payer: Self-pay | Admitting: Family Medicine

## 2019-03-15 ENCOUNTER — Ambulatory Visit: Payer: Medicare Other | Admitting: Family Medicine

## 2019-03-22 ENCOUNTER — Ambulatory Visit (INDEPENDENT_AMBULATORY_CARE_PROVIDER_SITE_OTHER)
Admission: RE | Admit: 2019-03-22 | Discharge: 2019-03-22 | Disposition: A | Discharge status: Home / Self Care | Payer: Medicare Other | Source: Ambulatory Visit | Attending: Family Medicine | Admitting: Family Medicine

## 2019-03-22 ENCOUNTER — Ambulatory Visit (INDEPENDENT_AMBULATORY_CARE_PROVIDER_SITE_OTHER): Payer: Medicare Other | Admitting: Family Medicine

## 2019-03-22 ENCOUNTER — Encounter: Payer: Self-pay | Admitting: Family Medicine

## 2019-03-22 ENCOUNTER — Other Ambulatory Visit: Payer: Self-pay

## 2019-03-22 VITALS — BP 140/62 | HR 62 | Temp 98.3°F | Ht 64.0 in | Wt 154.5 lb

## 2019-03-22 DIAGNOSIS — M109 Gout, unspecified: Secondary | ICD-10-CM

## 2019-03-22 DIAGNOSIS — L03115 Cellulitis of right lower limb: Secondary | ICD-10-CM | POA: Diagnosis not present

## 2019-03-22 DIAGNOSIS — M542 Cervicalgia: Secondary | ICD-10-CM

## 2019-03-22 LAB — URIC ACID: Uric Acid, Serum: 7.1 mg/dL — ABNORMAL HIGH (ref 2.4–7.0)

## 2019-03-22 MED ORDER — MELOXICAM 7.5 MG PO TABS: 7.5000 mg | ORAL_TABLET | Freq: Every day | ORAL | 0 refills | Status: DC | PRN

## 2019-03-22 MED ORDER — DOXYCYCLINE HYCLATE 100 MG PO TABS: 100.0000 mg | ORAL_TABLET | Freq: Two times a day (BID) | ORAL | 0 refills | Status: DC

## 2019-03-22 MED ORDER — COLCHICINE 0.6 MG PO TABS: ORAL_TABLET | ORAL | 1 refills | Status: AC

## 2019-03-22 NOTE — Assessment & Plan Note (Signed)
Treat with antibitoic to cover MRSA given possible positive culture in 11/2018.  Elevate.. has follow up with derm in 2 days for re-eval.

## 2019-03-22 NOTE — Progress Notes (Signed)
Chief Complaint  Patient presents with  . Wound Check    Right Lower Leg    History of Present Illness: HPI  85 year old female patient with chronic stasis dermatitis and recurrent rash on lags.. presents for follow up.  Treated in 11/2018 for cellulitis.  Culture showed:? Daughter thinks possibly MRSA.  Arterial ABIs 11/2018: Resting ABI the bilateral lower extremities within normal limits. Segmental exam demonstrates no significant arterial occlusive Disease.  Today she reports legs had returned to normal until  Las week. l She has had new injury to right anterior leg.. hit area 03/11/2019.  I last few days right lower leg/foot.. slightly more swollen and red.   She wears compression hose. She has been treating with lotion.  She continues to have gout flares.. in left thumb and hand, right ankle. Occurring every few months.  Last uric acid in 2017 7.4  She has off and on pain in neck, bilaterally off and on for months, now in last few days.. Increase pain with moving head.  No new numbness or weakness in arms and legs.  no fever. No falls. Tyelnol helps some.    COVID 19 screen No recent travel or known exposure to COVID19 The patient denies respiratory symptoms of COVID 19 at this time.  The importance of social distancing was discussed today.   Review of Systems  Constitutional: Negative for chills, fever and malaise/fatigue.  HENT: Negative for congestion and ear pain.   Eyes: Negative for pain and redness.  Respiratory: Negative for cough and shortness of breath.   Cardiovascular: Negative for chest pain, palpitations and leg swelling.  Gastrointestinal: Negative for abdominal pain, blood in stool, constipation, diarrhea, nausea and vomiting.  Genitourinary: Negative for dysuria.  Musculoskeletal: Negative for falls and myalgias.  Skin: Negative for rash.  Neurological: Negative for dizziness.  Psychiatric/Behavioral: Negative for depression. The patient is not  nervous/anxious.       Past Medical History:  Diagnosis Date  . Erosive esophagitis   . Gastric ulcer   . Gastritis   . GERD (gastroesophageal reflux disease)   . HLD (hyperlipidemia)   . HTN (hypertension)   . Osteoarthritis   . Osteopenia   . Prediabetes    pt does not check cbg at home    reports that she has never smoked. She has never used smokeless tobacco. She reports that she does not drink alcohol or use drugs.   Current Outpatient Medications:  .  aspirin 81 MG tablet, Take 81 mg by mouth daily., Disp: , Rfl:  .  betamethasone dipropionate (DIPROLENE) 0.05 % cream, Apply topically 2 (two) times daily., Disp: 30 g, Rfl: 0 .  dicyclomine (BENTYL) 10 MG capsule, Take 10 mg by mouth 3 (three) times daily as needed (abdomen pain)., Disp: , Rfl:  .  fish oil-omega-3 fatty acids 1000 MG capsule, Take 2 g by mouth 2 (two) times daily. , Disp: , Rfl:  .  Glucosamine Sulfate 500 MG CAPS, Take 500 mg by mouth 2 (two) times daily. , Disp: , Rfl:  .  hydroxypropyl methylcellulose (ISOPTO TEARS) 2.5 % ophthalmic solution, Place 1 drop into both eyes 3 (three) times daily as needed (dry eyes)., Disp: , Rfl:  .  lisinopril-hydrochlorothiazide (PRINZIDE,ZESTORETIC) 20-25 MG tablet, TAKE 1 TABLET EVERY MORNING, Disp: 90 tablet, Rfl: 1 .  metoprolol tartrate (LOPRESSOR) 25 MG tablet, TAKE 1 TABLET TWICE DAILY (TAKE WITH 50MG  TABLET FOR A TOTAL OF 75MG  TWICE DAILY), Disp: 180 tablet, Rfl: 1 .  Multiple Vitamin (MULTIVITAMIN) tablet, Take 1 tablet by mouth daily.  , Disp: , Rfl:  .  mupirocin ointment (BACTROBAN) 2 %, , Disp: , Rfl:  .  omeprazole (PRILOSEC) 40 MG capsule, TAKE 1 CAPSULE EVERY DAY, Disp: 90 capsule, Rfl: 3 .  OVER THE COUNTER MEDICATION, I caps 1 tab bid, Disp: , Rfl:  .  sertraline (ZOLOFT) 25 MG tablet, TAKE 1 TABLET BY MOUTH EVERY DAY, Disp: 90 tablet, Rfl: 0 .  triamcinolone cream (KENALOG) 0.5 %, Apply 1 application topically 2 (two) times daily., Disp: 15 g, Rfl: 0    Observations/Objective: Blood pressure 140/62, pulse 62, temperature 98.3 F (36.8 C), temperature source Oral, height 5\' 4"  (1.626 m), weight 154 lb 8 oz (70.1 kg).  Physical Exam Musculoskeletal:     Cervical back: She exhibits decreased range of motion, tenderness and deformity. She exhibits no bony tenderness and no swelling.     Comments: kyphosis on cervical spine  Skin:         Comments: redness and slight warmth from mid calf to foot on right surrounding healing abraision 1 plus swelling in right foot, none in left.  Neurological:     Mental Status: She is oriented to person, place, and time.     Cranial Nerves: Cranial nerves are intact.     Sensory: Sensation is intact.     Motor: Motor function is intact.     Coordination: Coordination is intact.      Assessment and Plan      Eliezer Lofts, MD

## 2019-03-22 NOTE — Patient Instructions (Addendum)
Please stop at the lab to have labs drawn. For gout flare .. start a course of colchicine  Daily. Start antibiotics for cellulitis in right lower leg.  Keep follow up in dermatologist. We will call with X-ray results. If tylenol does not help with pain can try meloxicam for pain and inflammation in neck .. limit as much as able.  Low-Purine Eating Plan A low-purine eating plan involves making food choices to limit your intake of purine. Purine is a kind of uric acid. Too much uric acid in your blood can cause certain conditions, such as gout and kidney stones. Eating a low-purine diet can help control these conditions. What are tips for following this plan? Reading food labels   Avoid foods with saturated or Trans fat.  Check the ingredient list of grains-based foods, such as bread and cereal, to make sure that they contain whole grains.  Check the ingredient list of sauces or soups to make sure they do not contain meat or fish.  When choosing soft drinks, check the ingredient list to make sure they do not contain high-fructose corn syrup. Shopping  Buy plenty of fresh fruits and vegetables.  Avoid buying canned or fresh fish.  Buy dairy products labeled as low-fat or nonfat.  Avoid buying premade or processed foods. These foods are often high in fat, salt (sodium), and added sugar. Cooking  Use olive oil instead of butter when cooking. Oils like olive oil, canola oil, and sunflower oil contain healthy fats. Meal planning  Learn which foods do or do not affect you. If you find out that a food tends to cause your gout symptoms to flare up, avoid eating that food. You can enjoy foods that do not cause problems. If you have any questions about a food item, talk with your dietitian or health care provider.  Limit foods high in fat, especially saturated fat. Fat makes it harder for your body to get rid of uric acid.  Choose foods that are lower in fat and are lean sources of  protein. General guidelines  Limit alcohol intake to no more than 1 drink a day for nonpregnant women and 2 drinks a day for men. One drink equals 12 oz of beer, 5 oz of wine, or 1 oz of hard liquor. Alcohol can affect the way your body gets rid of uric acid.  Drink plenty of water to keep your urine clear or pale yellow. Fluids can help remove uric acid from your body.  If directed by your health care provider, take a vitamin C supplement.  Work with your health care provider and dietitian to develop a plan to achieve or maintain a healthy weight. Losing weight can help reduce uric acid in your blood. What foods are recommended? The items listed may not be a complete list. Talk with your dietitian about what dietary choices are best for you. Foods low in purines Foods low in purines do not need to be limited. These include:  All fruits.  All low-purine vegetables, pickles, and olives.  Breads, pasta, rice, cornbread, and popcorn. Cake and other baked goods.  All dairy foods.  Eggs, nuts, and nut butters.  Spices and condiments, such as salt, herbs, and vinegar.  Plant oils, butter, and margarine.  Water, sugar-free soft drinks, tea, coffee, and cocoa.  Vegetable-based soups, broths, sauces, and gravies. Foods moderate in purines Foods moderate in purines should be limited to the amounts listed.   cup of asparagus, cauliflower, spinach, mushrooms, or green  peas, each day.  2/3 cup uncooked oatmeal, each day.   cup dry wheat bran or wheat germ, each day.  2-3 ounces of meat or poultry, each day.  4-6 ounces of shellfish, such as crab, lobster, oysters, or shrimp, each day.  1 cup cooked beans, peas, or lentils, each day.  Soup, broths, or bouillon made from meat or fish. Limit these foods as much as possible. What foods are not recommended? The items listed may not be a complete list. Talk with your dietitian about what dietary choices are best for you. Limit your  intake of foods high in purines, including:  Beer and other alcohol.  Meat-based gravy or sauce.  Canned or fresh fish, such as: ? Anchovies, sardines, herring, and tuna. ? Mussels and scallops. ? Codfish, trout, and haddock.  Berniece Salines.  Organ meats, such as: ? Liver or kidney. ? Tripe. ? Sweetbreads (thymus gland or pancreas).  Wild Clinical biochemist.  Yeast or yeast extract supplements.  Drinks sweetened with high-fructose corn syrup. Summary  Eating a low-purine diet can help control conditions caused by too much uric acid in the body, such as gout or kidney stones.  Choose low-purine foods, limit alcohol, and limit foods high in fat.  You will learn over time which foods do or do not affect you. If you find out that a food tends to cause your gout symptoms to flare up, avoid eating that food. This information is not intended to replace advice given to you by your health care provider. Make sure you discuss any questions you have with your health care provider. Document Released: 01/31/2011 Document Revised: 11/19/2016 Document Reviewed: 11/19/2016 Elsevier Interactive Patient Education  2019 [employer].

## 2019-03-22 NOTE — Assessment & Plan Note (Signed)
No definate vertebral tenderness, but pt is poor historian and has trouble localizing pain. Eval with X-ray  For fracture, most liekly symptoms from arthritis.  If tylenol not helping.. can use meloxicam on limited basis ( given CKD and hx of esophagitis)

## 2019-03-22 NOTE — Assessment & Plan Note (Signed)
Check uric acid.Marland Kitchen if elevated would be a candidate for low dose allopurinol.  Can use colchicine prn gout flare. Reviewed low purine diet.

## 2019-03-23 ENCOUNTER — Telehealth: Payer: Self-pay | Admitting: Family Medicine

## 2019-03-23 NOTE — Telephone Encounter (Signed)
Lab and x-ray results discussed with Thayer Headings via telephone.

## 2019-03-23 NOTE — Telephone Encounter (Signed)
Pt's daughter returned Donna's call regarding x-ray.

## 2019-03-24 DIAGNOSIS — Z872 Personal history of diseases of the skin and subcutaneous tissue: Secondary | ICD-10-CM | POA: Diagnosis not present

## 2019-03-24 DIAGNOSIS — L308 Other specified dermatitis: Secondary | ICD-10-CM | POA: Diagnosis not present

## 2019-03-24 DIAGNOSIS — L570 Actinic keratosis: Secondary | ICD-10-CM | POA: Diagnosis not present

## 2019-04-28 ENCOUNTER — Other Ambulatory Visit: Payer: Self-pay | Admitting: Family Medicine

## 2019-05-19 ENCOUNTER — Other Ambulatory Visit: Payer: Self-pay | Admitting: Family Medicine

## 2019-07-21 DIAGNOSIS — Z23 Encounter for immunization: Secondary | ICD-10-CM | POA: Diagnosis not present

## 2019-08-16 ENCOUNTER — Other Ambulatory Visit: Payer: Self-pay | Admitting: *Deleted

## 2019-08-16 DIAGNOSIS — H6123 Impacted cerumen, bilateral: Secondary | ICD-10-CM | POA: Diagnosis not present

## 2019-08-16 MED ORDER — METOPROLOL TARTRATE 50 MG PO TABS: ORAL_TABLET | ORAL | 0 refills | Status: DC

## 2019-08-16 MED ORDER — METOPROLOL TARTRATE 25 MG PO TABS: ORAL_TABLET | ORAL | 0 refills | Status: DC

## 2019-08-16 MED ORDER — OMEPRAZOLE 40 MG PO CPDR: 40.0000 mg | DELAYED_RELEASE_CAPSULE | Freq: Every day | ORAL | 0 refills | Status: DC

## 2019-08-16 MED ORDER — LISINOPRIL-HYDROCHLOROTHIAZIDE 20-25 MG PO TABS: 1.0000 | ORAL_TABLET | Freq: Every morning | ORAL | 0 refills | Status: DC

## 2019-08-16 NOTE — Addendum Note (Signed)
Addended by: Carter Kitten on: 08/16/2019 04:03 PM   Modules accepted: Orders

## 2019-08-23 ENCOUNTER — Other Ambulatory Visit: Payer: Self-pay

## 2019-08-23 ENCOUNTER — Ambulatory Visit (INDEPENDENT_AMBULATORY_CARE_PROVIDER_SITE_OTHER): Payer: Medicare Other | Admitting: Family Medicine

## 2019-08-23 ENCOUNTER — Ambulatory Visit: Payer: Medicare Other | Admitting: Family Medicine

## 2019-08-23 VITALS — BP 160/78 | HR 77 | Temp 97.9°F | Ht 64.0 in | Wt 156.5 lb

## 2019-08-23 DIAGNOSIS — F320 Major depressive disorder, single episode, mild: Secondary | ICD-10-CM | POA: Diagnosis not present

## 2019-08-23 DIAGNOSIS — I10 Essential (primary) hypertension: Secondary | ICD-10-CM

## 2019-08-23 MED ORDER — SERTRALINE HCL 50 MG PO TABS: 50.0000 mg | ORAL_TABLET | Freq: Every day | ORAL | 5 refills | Status: DC

## 2019-08-23 MED ORDER — LISINOPRIL-HYDROCHLOROTHIAZIDE 20-12.5 MG PO TABS: 2.0000 | ORAL_TABLET | Freq: Every day | ORAL | 3 refills | Status: DC

## 2019-08-23 NOTE — Patient Instructions (Addendum)
Stop lisinopril 20/25 mg daily, change to lisinopril/HCTZ 20/12.5 mg  2 TABLETS daily.  Follow BP daily.Marland Kitchen goal BP < 150/90.  Increase sertraline to 50 mg daily ( you can use up what you have by taking 2 tabs)

## 2019-08-23 NOTE — Progress Notes (Signed)
Chief Complaint  Patient presents with  . Hypertension    History of Present Illness: HPI  85 year old female with HTN presents for elevated an lower  BP .  140-160/80-90  HR 55-61. No headache  Going on a trip to [place] next week.  Hypertension:    On lisinopril/HCTZ and metoprolol She is taking it regualrly. Some anxiety.. but sometimes agitated about things she cannot do, see.  Daugther feels she is more irritable in last year. No new meds. BP Readings from Last 3 Encounters:  08/23/19 (!) 160/78  03/22/19 140/62  11/23/18 120/62  Using medication without problems or lightheadedness: none Chest pain with exertion:none Edema:none Short of breath: baseline DOB in 2 years, SOB gradually worsening... has to sit down a lot to rest.  (no cough, no wheeze) Average home BPs: Other issues: Neg ECHO 2018, nml EKLG, nml labs  COVID 19 screen No recent travel or known exposure to COVID19 The patient denies respiratory symptoms of COVID 19 at this time.  The importance of social distancing was discussed today.   Review of Systems  Constitutional: Positive for malaise/fatigue. Negative for chills and fever.  HENT: Negative for congestion and ear pain.   Eyes: Negative for pain and redness.  Respiratory: Positive for shortness of breath. Negative for cough.   Cardiovascular: Negative for chest pain, palpitations and leg swelling.  Gastrointestinal: Negative for abdominal pain, blood in stool, constipation, diarrhea, nausea and vomiting.  Genitourinary: Negative for dysuria.  Musculoskeletal: Negative for falls and myalgias.  Skin: Negative for rash.  Neurological: Negative for dizziness.  Psychiatric/Behavioral: Negative for depression. The patient is not nervous/anxious.       Past Medical History:  Diagnosis Date  . Erosive esophagitis   . Gastric ulcer   . Gastritis   . GERD (gastroesophageal reflux disease)   . HLD (hyperlipidemia)   . HTN (hypertension)   .  Osteoarthritis   . Osteopenia   . Prediabetes    pt does not check cbg at home    reports that she has never smoked. She has never used smokeless tobacco. She reports that she does not drink alcohol or use drugs.   Current Outpatient Medications:  .  aspirin 81 MG tablet, Take 81 mg by mouth daily., Disp: , Rfl:  .  betamethasone dipropionate (DIPROLENE) 0.05 % cream, Apply topically 2 (two) times daily., Disp: 30 g, Rfl: 0 .  colchicine 0.6 MG tablet, 1 tab po x 1 then repeat in 2 hours, continue daily until flare resolved. (Patient taking differently: as needed. 1 tab po x 1 then repeat in 2 hours, continue daily until flare resolved, as needed.), Disp: 30 tablet, Rfl: 1 .  dicyclomine (BENTYL) 10 MG capsule, Take 10 mg by mouth 3 (three) times daily as needed (abdomen pain)., Disp: , Rfl:  .  fish oil-omega-3 fatty acids 1000 MG capsule, Take 2 g by mouth 2 (two) times daily. , Disp: , Rfl:  .  Glucosamine Sulfate 500 MG CAPS, Take 500 mg by mouth 2 (two) times daily. , Disp: , Rfl:  .  hydroxypropyl methylcellulose (ISOPTO TEARS) 2.5 % ophthalmic solution, Place 1 drop into both eyes 3 (three) times daily as needed (dry eyes)., Disp: , Rfl:  .  lisinopril-hydrochlorothiazide (ZESTORETIC) 20-25 MG tablet, Take 1 tablet by mouth every morning., Disp: 90 tablet, Rfl: 0 .  meloxicam (MOBIC) 7.5 MG tablet, Take 1 tablet (7.5 mg total) by mouth daily as needed for pain., Disp: 15 tablet, Rfl:  0 .  metoprolol tartrate (LOPRESSOR) 25 MG tablet, TAKE 1 TABLET TWICE DAILY (TAKE WITH 50MG  TABLET FOR A TOTAL OF 75MG  TWICE DAILY), Disp: 180 tablet, Rfl: 0 .  metoprolol tartrate (LOPRESSOR) 50 MG tablet, TAKE 1 TABLET TWICE DAILY (TAKE WITH 25MG  TABLET FOR A TOTAL OF 75MG  TWICE DAILY), Disp: 180 tablet, Rfl: 0 .  Multiple Vitamin (MULTIVITAMIN) tablet, Take 1 tablet by mouth daily.  , Disp: , Rfl:  .  mupirocin ointment (BACTROBAN) 2 %, , Disp: , Rfl:  .  OVER THE COUNTER MEDICATION, I caps 1 tab bid,  Disp: , Rfl:  .  sertraline (ZOLOFT) 25 MG tablet, TAKE 1 TABLET BY MOUTH EVERY DAY, Disp: 90 tablet, Rfl: 0 .  triamcinolone cream (KENALOG) 0.5 %, Apply 1 application topically 2 (two) times daily. (Patient taking differently: Apply 1 application topically as needed. ), Disp: 15 g, Rfl: 0 .  doxycycline (VIBRA-TABS) 100 MG tablet, Take 1 tablet (100 mg total) by mouth 2 (two) times daily., Disp: 20 tablet, Rfl: 0 .  omeprazole (PRILOSEC) 40 MG capsule, Take 1 capsule (40 mg total) by mouth daily., Disp: 90 capsule, Rfl: 0   Observations/Objective: Blood pressure (!) 160/78, pulse 77, temperature 97.9 F (36.6 C), temperature source Temporal, height 5\' 4"  (1.626 m), weight 156 lb 8 oz (71 kg), SpO2 94 %.  Physical Exam Constitutional:      General: She is not in acute distress.    Appearance: Normal appearance. She is well-developed. She is not ill-appearing or toxic-appearing.  HENT:     Head: Normocephalic.     Right Ear: Hearing, tympanic membrane, ear canal and external ear normal. Tympanic membrane is not erythematous, retracted or bulging.     Left Ear: Hearing, tympanic membrane, ear canal and external ear normal. Tympanic membrane is not erythematous, retracted or bulging.     Nose: No mucosal edema or rhinorrhea.     Right Sinus: No maxillary sinus tenderness or frontal sinus tenderness.     Left Sinus: No maxillary sinus tenderness or frontal sinus tenderness.     Mouth/Throat:     Pharynx: Uvula midline.  Eyes:     General: Lids are normal. Lids are everted, no foreign bodies appreciated.     Conjunctiva/sclera: Conjunctivae normal.     Pupils: Pupils are equal, round, and reactive to light.  Neck:     Thyroid: No thyroid mass or thyromegaly.     Vascular: No carotid bruit.     Trachea: Trachea normal.  Cardiovascular:     Rate and Rhythm: Normal rate and regular rhythm.     Pulses: Normal pulses.     Heart sounds: Normal heart sounds, S1 normal and S2 normal. No murmur.  No friction rub. No gallop.   Pulmonary:     Effort: Pulmonary effort is normal. No tachypnea or respiratory distress.     Breath sounds: Normal breath sounds. No decreased breath sounds, wheezing, rhonchi or rales.  Abdominal:     General: Bowel sounds are normal.     Palpations: Abdomen is soft.     Tenderness: There is no abdominal tenderness.  Musculoskeletal:     Cervical back: Normal range of motion and neck supple.  Skin:    General: Skin is warm and dry.     Findings: No rash.     Comments:  Erythema, no warmth and no discharge in left lower leg, small patch of redness in right lower leg Flake and scabbing resolved..  Neurological:  Mental Status: She is alert.  Psychiatric:        Mood and Affect: Mood is not anxious or depressed.        Speech: Speech normal.        Behavior: Behavior normal. Behavior is cooperative.        Thought Content: Thought content normal.        Judgment: Judgment normal.      Assessment and Plan   HYPERTENSION, BENIGN Stop lisinopril 20/25 mg daily, change to lisinopril/HCTZ 20/12.5 mg  2 TABLETS daily.  Follow BP daily.Marland Kitchen goal BP < 150/90.   Depression, major, single episode, mild (HCC)  Increase sertraline to 50 mg daily ( you can use up what you have by taking 2 tabs)       Eliezer Lofts, MD

## 2019-09-06 ENCOUNTER — Other Ambulatory Visit: Payer: Self-pay

## 2019-09-06 ENCOUNTER — Encounter: Payer: Self-pay | Admitting: Family Medicine

## 2019-09-06 ENCOUNTER — Ambulatory Visit (INDEPENDENT_AMBULATORY_CARE_PROVIDER_SITE_OTHER): Payer: Medicare Other | Admitting: Family Medicine

## 2019-09-06 VITALS — BP 135/60 | HR 66 | Temp 97.4°F | Ht 64.0 in | Wt 159.8 lb

## 2019-09-06 DIAGNOSIS — R001 Bradycardia, unspecified: Secondary | ICD-10-CM | POA: Diagnosis not present

## 2019-09-06 DIAGNOSIS — I10 Essential (primary) hypertension: Secondary | ICD-10-CM | POA: Diagnosis not present

## 2019-09-06 DIAGNOSIS — I499 Cardiac arrhythmia, unspecified: Secondary | ICD-10-CM

## 2019-09-06 DIAGNOSIS — F320 Major depressive disorder, single episode, mild: Secondary | ICD-10-CM

## 2019-09-06 DIAGNOSIS — M6530 Trigger finger, unspecified finger: Secondary | ICD-10-CM | POA: Diagnosis not present

## 2019-09-06 NOTE — Assessment & Plan Note (Signed)
Given SSRI higher dose of medication more time to start working.

## 2019-09-06 NOTE — Patient Instructions (Addendum)
Conitnue higher doses of both medicaitons.  Call if mood not improving in 4 weeks.   We will call with referral for hand specialist.

## 2019-09-06 NOTE — Assessment & Plan Note (Signed)
Improved control on higher dose meds.

## 2019-09-06 NOTE — Assessment & Plan Note (Signed)
Occ sinus pause. Asymptomatic at this time. If becomes symptomatic can decrease BBlocker.

## 2019-09-06 NOTE — Progress Notes (Signed)
Chief Complaint  Patient presents with  . Follow-up    hypertension/mood    History of Present Illness: HPI    85 year old female presents for  Follow up HTN and mood issues.  She presents with her daughter.   At last OV increase BP meed to lisinopril HCTZ 2 tabs of 20/12.5 mg daily.  Blood pressure is improved in office today. 135-157/69-94 She has not noted any irregular activity of heart.  No lightheadedness..  She does tire out easily.  SOB with exertion for years  BP Readings from Last 3 Encounters:  09/06/19 135/60  08/23/19 (!) 160/78  03/22/19 140/62   GAD, irritability.Marland Kitchen at last OV increase sertraline to 50 mg daily. She has only been higher dose for a few days. Had a good trip.  No SE   COVID 19 screen No recent travel or known exposure to COVID19 The patient denies respiratory symptoms of COVID 19 at this time.  The importance of social distancing was discussed today.   ROS    Past Medical History:  Diagnosis Date  . Erosive esophagitis   . Gastric ulcer   . Gastritis   . GERD (gastroesophageal reflux disease)   . HLD (hyperlipidemia)   . HTN (hypertension)   . Osteoarthritis   . Osteopenia   . Prediabetes    pt does not check cbg at home    reports that she has never smoked. She has never used smokeless tobacco. She reports that she does not drink alcohol or use drugs.   Current Outpatient Medications:  .  aspirin 81 MG tablet, Take 81 mg by mouth daily., Disp: , Rfl:  .  betamethasone dipropionate (DIPROLENE) 0.05 % cream, Apply topically 2 (two) times daily., Disp: 30 g, Rfl: 0 .  colchicine 0.6 MG tablet, 1 tab po x 1 then repeat in 2 hours, continue daily until flare resolved. (Patient taking differently: as needed. 1 tab po x 1 then repeat in 2 hours, continue daily until flare resolved, as needed.), Disp: 30 tablet, Rfl: 1 .  dicyclomine (BENTYL) 10 MG capsule, Take 10 mg by mouth 3 (three) times daily as needed (abdomen pain)., Disp: , Rfl:   .  doxycycline (VIBRA-TABS) 100 MG tablet, Take 1 tablet (100 mg total) by mouth 2 (two) times daily., Disp: 20 tablet, Rfl: 0 .  fish oil-omega-3 fatty acids 1000 MG capsule, Take 2 g by mouth 2 (two) times daily. , Disp: , Rfl:  .  Glucosamine Sulfate 500 MG CAPS, Take 500 mg by mouth 2 (two) times daily. , Disp: , Rfl:  .  hydroxypropyl methylcellulose (ISOPTO TEARS) 2.5 % ophthalmic solution, Place 1 drop into both eyes 3 (three) times daily as needed (dry eyes)., Disp: , Rfl:  .  lisinopril-hydrochlorothiazide (ZESTORETIC) 20-12.5 MG tablet, Take 2 tablets by mouth daily., Disp: 180 tablet, Rfl: 3 .  meloxicam (MOBIC) 7.5 MG tablet, Take 1 tablet (7.5 mg total) by mouth daily as needed for pain., Disp: 15 tablet, Rfl: 0 .  metoprolol tartrate (LOPRESSOR) 25 MG tablet, TAKE 1 TABLET TWICE DAILY (TAKE WITH 50MG  TABLET FOR A TOTAL OF 75MG  TWICE DAILY), Disp: 180 tablet, Rfl: 0 .  metoprolol tartrate (LOPRESSOR) 50 MG tablet, TAKE 1 TABLET TWICE DAILY (TAKE WITH 25MG  TABLET FOR A TOTAL OF 75MG  TWICE DAILY), Disp: 180 tablet, Rfl: 0 .  Multiple Vitamin (MULTIVITAMIN) tablet, Take 1 tablet by mouth daily.  , Disp: , Rfl:  .  mupirocin ointment (BACTROBAN) 2 %, ,  Disp: , Rfl:  .  omeprazole (PRILOSEC) 40 MG capsule, Take 1 capsule (40 mg total) by mouth daily., Disp: 90 capsule, Rfl: 0 .  OVER THE COUNTER MEDICATION, I caps 1 tab bid, Disp: , Rfl:  .  sertraline (ZOLOFT) 50 MG tablet, Take 1 tablet (50 mg total) by mouth at bedtime., Disp: 30 tablet, Rfl: 5 .  triamcinolone cream (KENALOG) 0.5 %, Apply 1 application topically 2 (two) times daily. (Patient taking differently: Apply 1 application topically as needed. ), Disp: 15 g, Rfl: 0   Observations/Objective: Blood pressure 135/60, pulse 66, temperature (!) 97.4 F (36.3 C), temperature source Temporal, height 5\' 4"  (1.626 m), weight 159 lb 12 oz (72.5 kg), SpO2 98 %.  EKG: bradycardia, sinus pause, poor R wave progression, otherwise unchanged  from previous in 2018  Physical Exam Constitutional:      General: She is not in acute distress.    Appearance: Normal appearance. She is well-developed. She is not ill-appearing or toxic-appearing.  HENT:     Head: Normocephalic.     Right Ear: Hearing, tympanic membrane, ear canal and external ear normal.     Left Ear: Hearing, tympanic membrane, ear canal and external ear normal.     Nose: Nose normal.  Eyes:     General: Lids are normal. Lids are everted, no foreign bodies appreciated.     Conjunctiva/sclera: Conjunctivae normal.     Pupils: Pupils are equal, round, and reactive to light.  Neck:     Musculoskeletal: Normal range of motion and neck supple.     Thyroid: No thyroid mass or thyromegaly.     Vascular: No carotid bruit.     Trachea: Trachea normal.  Cardiovascular:     Rate and Rhythm: Normal rate. Rhythm irregularly irregular.     Heart sounds: Normal heart sounds, S1 normal and S2 normal. No murmur. No gallop.      Comments:  Gout flare in right lower leg Pulmonary:     Effort: Pulmonary effort is normal. No respiratory distress.     Breath sounds: Normal breath sounds. No wheezing, rhonchi or rales.  Abdominal:     General: Bowel sounds are normal. There is no distension or abdominal bruit.     Palpations: Abdomen is soft. There is no fluid wave or mass.     Tenderness: There is no abdominal tenderness. There is no guarding or rebound.     Hernia: No hernia is present.  Musculoskeletal:     Right lower leg: No edema.     Left lower leg: No edema.  Lymphadenopathy:     Cervical: No cervical adenopathy.  Skin:    General: Skin is warm and dry.     Findings: No rash.  Neurological:     Mental Status: She is alert.     Cranial Nerves: No cranial nerve deficit.     Sensory: No sensory deficit.  Psychiatric:        Mood and Affect: Mood is not anxious or depressed.        Speech: Speech normal.        Behavior: Behavior normal. Behavior is cooperative.         Judgment: Judgment normal.      Assessment and Plan   HYPERTENSION, BENIGN Improved control on higher dose meds.   Bradycardia Occ sinus pause. Asymptomatic at this time. If becomes symptomatic can decrease BBlocker.    Depression, major, single episode, mild (HCC) Given SSRI higher dose of medication  more time to start working.   Trigger finger discussed last.. requested referral to hand specialist   Eliezer Lofts, MD

## 2019-09-20 ENCOUNTER — Ambulatory Visit: Payer: Medicare Other

## 2019-09-20 ENCOUNTER — Telehealth: Payer: Self-pay | Admitting: Family Medicine

## 2019-09-20 ENCOUNTER — Ambulatory Visit (INDEPENDENT_AMBULATORY_CARE_PROVIDER_SITE_OTHER): Payer: Medicare Other

## 2019-09-20 ENCOUNTER — Other Ambulatory Visit: Payer: Self-pay

## 2019-09-20 ENCOUNTER — Other Ambulatory Visit (INDEPENDENT_AMBULATORY_CARE_PROVIDER_SITE_OTHER): Payer: Medicare Other

## 2019-09-20 DIAGNOSIS — R7303 Prediabetes: Secondary | ICD-10-CM

## 2019-09-20 DIAGNOSIS — E559 Vitamin D deficiency, unspecified: Secondary | ICD-10-CM

## 2019-09-20 DIAGNOSIS — E782 Mixed hyperlipidemia: Secondary | ICD-10-CM

## 2019-09-20 DIAGNOSIS — M8580 Other specified disorders of bone density and structure, unspecified site: Secondary | ICD-10-CM

## 2019-09-20 DIAGNOSIS — Z0000 Encounter for general adult medical examination without abnormal findings: Secondary | ICD-10-CM

## 2019-09-20 LAB — COMPREHENSIVE METABOLIC PANEL
ALT: 9 U/L (ref 0–35)
AST: 14 U/L (ref 0–37)
Albumin: 3.8 g/dL (ref 3.5–5.2)
Alkaline Phosphatase: 55 U/L (ref 39–117)
BUN: 22 mg/dL (ref 6–23)
CO2: 26 mEq/L (ref 19–32)
Calcium: 9.6 mg/dL (ref 8.4–10.5)
Chloride: 102 mEq/L (ref 96–112)
Creatinine, Ser: 0.91 mg/dL (ref 0.40–1.20)
GFR: 57.82 mL/min — ABNORMAL LOW (ref >60.00)
Glucose, Bld: 107 mg/dL — ABNORMAL HIGH (ref 70–99)
Potassium: 4.4 mEq/L (ref 3.5–5.1)
Sodium: 136 mEq/L (ref 135–145)
Total Bilirubin: 0.5 mg/dL (ref 0.2–1.2)
Total Protein: 7 g/dL (ref 6.0–8.3)

## 2019-09-20 LAB — HEMOGLOBIN A1C: Hgb A1c MFr Bld: 6.1 % (ref 4.6–6.5)

## 2019-09-20 LAB — VITAMIN D 25 HYDROXY (VIT D DEFICIENCY, FRACTURES): VITD: 40.37 ng/mL (ref 30.00–100.00)

## 2019-09-20 NOTE — Progress Notes (Signed)
No critical labs need to be addressed urgently. We will discuss labs in detail at upcoming office visit.   

## 2019-09-20 NOTE — Patient Instructions (Signed)
[name] , Thank you for taking time to come for your Medicare Wellness Visit. I appreciate your ongoing commitment to your health goals. Please review the following plan we discussed and let me know if I can assist you in the future.   Screening recommendations/referrals: Colonoscopy: no longer required Mammogram: Up to date, completed 11/17/2018 Bone Density: Up to date, completed 11/10/2017 Recommended yearly ophthalmology/optometry visit for glaucoma screening and checkup Recommended yearly dental visit for hygiene and checkup  Vaccinations: Influenza vaccine: Up to date, completed 07/21/2019 Pneumococcal vaccine: Completed series Tdap vaccine: decline Shingles vaccine: will check with insurance    Advanced directives: Please bring a copy of your POA (Power of Attorney) and/or Living Will to your next appointment.   Conditions/risks identified: hypertension, hyperlipidemia  Next appointment: 09/30/2019 @ 2:20 pm    Preventive Care 83 Years and Older, Female Preventive care refers to lifestyle choices and visits with your health care provider that can promote health and wellness. What does preventive care include?  A yearly physical exam. This is also called an annual well check.  Dental exams once or twice a year.  Routine eye exams. Ask your health care provider how often you should have your eyes checked.  Personal lifestyle choices, including:  Daily care of your teeth and gums.  Regular physical activity.  Eating a healthy diet.  Avoiding tobacco and drug use.  Limiting alcohol use.  Practicing safe sex.  Taking low-dose aspirin every day.  Taking vitamin and mineral supplements as recommended by your health care provider. What happens during an annual well check? The services and screenings done by your health care provider during your annual well check will depend on your age, overall health, lifestyle risk factors, and family history of disease.  Counseling  Your health care provider may ask you questions about your:  Alcohol use.  Tobacco use.  Drug use.  Emotional well-being.  Home and relationship well-being.  Sexual activity.  Eating habits.  History of falls.  Memory and ability to understand (cognition).  Work and work Statistician.  Reproductive health. Screening  You may have the following tests or measurements:  Height, weight, and BMI.  Blood pressure.  Lipid and cholesterol levels. These may be checked every 5 years, or more frequently if you are over 12 years old.  Skin check.  Lung cancer screening. You may have this screening every year starting at age 10 if you have a 30-pack-year history of smoking and currently smoke or have quit within the past 15 years.  Fecal occult blood test (FOBT) of the stool. You may have this test every year starting at age 43.  Flexible sigmoidoscopy or colonoscopy. You may have a sigmoidoscopy every 5 years or a colonoscopy every 10 years starting at age 23.  Hepatitis C blood test.  Hepatitis B blood test.  Sexually transmitted disease (STD) testing.  Diabetes screening. This is done by checking your blood sugar (glucose) after you have not eaten for a while (fasting). You may have this done every 1-3 years.  Bone density scan. This is done to screen for osteoporosis. You may have this done starting at age 52.  Mammogram. This may be done every 1-2 years. Talk to your health care provider about how often you should have regular mammograms. Talk with your health care provider about your test results, treatment options, and if necessary, the need for more tests. Vaccines  Your health care provider may recommend certain vaccines, such as:  Influenza vaccine.  This is recommended every year.  Tetanus, diphtheria, and acellular pertussis (Tdap, Td) vaccine. You may need a Td booster every 10 years.  Zoster vaccine. You may need this after age 69.   Pneumococcal 13-valent conjugate (PCV13) vaccine. One dose is recommended after age 66.  Pneumococcal polysaccharide (PPSV23) vaccine. One dose is recommended after age 34. Talk to your health care provider about which screenings and vaccines you need and how often you need them. This information is not intended to replace advice given to you by your health care provider. Make sure you discuss any questions you have with your health care provider. Document Released: 11/02/2015 Document Revised: 06/25/2016 Document Reviewed: 08/07/2015 Elsevier Interactive Patient Education  2017 [place] Prevention in the Home Falls can cause injuries. They can happen to people of all ages. There are many things you can do to make your home safe and to help prevent falls. What can I do on the outside of my home?  Regularly fix the edges of walkways and driveways and fix any cracks.  Remove anything that might make you trip as you walk through a door, such as a raised step or threshold.  Trim any bushes or trees on the path to your home.  Use bright outdoor lighting.  Clear any walking paths of anything that might make someone trip, such as rocks or tools.  Regularly check to see if handrails are loose or broken. Make sure that both sides of any steps have handrails.  Any raised decks and porches should have guardrails on the edges.  Have any leaves, snow, or ice cleared regularly.  Use sand or salt on walking paths during winter.  Clean up any spills in your garage right away. This includes oil or grease spills. What can I do in the bathroom?  Use night lights.  Install grab bars by the toilet and in the tub and shower. Do not use towel bars as grab bars.  Use non-skid mats or decals in the tub or shower.  If you need to sit down in the shower, use a plastic, non-slip stool.  Keep the floor dry. Clean up any water that spills on the floor as soon as it happens.  Remove soap  buildup in the tub or shower regularly.  Attach bath mats securely with double-sided non-slip rug tape.  Do not have throw rugs and other things on the floor that can make you trip. What can I do in the bedroom?  Use night lights.  Make sure that you have a light by your bed that is easy to reach.  Do not use any sheets or blankets that are too big for your bed. They should not hang down onto the floor.  Have a firm chair that has side arms. You can use this for support while you get dressed.  Do not have throw rugs and other things on the floor that can make you trip. What can I do in the kitchen?  Clean up any spills right away.  Avoid walking on wet floors.  Keep items that you use a lot in easy-to-reach places.  If you need to reach something above you, use a strong step stool that has a grab bar.  Keep electrical cords out of the way.  Do not use floor polish or wax that makes floors slippery. If you must use wax, use non-skid floor wax.  Do not have throw rugs and other things on the floor that can make  you trip. What can I do with my stairs?  Do not leave any items on the stairs.  Make sure that there are handrails on both sides of the stairs and use them. Fix handrails that are broken or loose. Make sure that handrails are as long as the stairways.  Check any carpeting to make sure that it is firmly attached to the stairs. Fix any carpet that is loose or worn.  Avoid having throw rugs at the top or bottom of the stairs. If you do have throw rugs, attach them to the floor with carpet tape.  Make sure that you have a light switch at the top of the stairs and the bottom of the stairs. If you do not have them, ask someone to add them for you. What else can I do to help prevent falls?  Wear shoes that:  Do not have high heels.  Have rubber bottoms.  Are comfortable and fit you well.  Are closed at the toe. Do not wear sandals.  If you use a stepladder:  Make  sure that it is fully opened. Do not climb a closed stepladder.  Make sure that both sides of the stepladder are locked into place.  Ask someone to hold it for you, if possible.  Clearly mark and make sure that you can see:  Any grab bars or handrails.  First and last steps.  Where the edge of each step is.  Use tools that help you move around (mobility aids) if they are needed. These include:  Canes.  Walkers.  Scooters.  Crutches.  Turn on the lights when you go into a dark area. Replace any light bulbs as soon as they burn out.  Set up your furniture so you have a clear path. Avoid moving your furniture around.  If any of your floors are uneven, fix them.  If there are any pets around you, be aware of where they are.  Review your medicines with your doctor. Some medicines can make you feel dizzy. This can increase your chance of falling. Ask your doctor what other things that you can do to help prevent falls. This information is not intended to replace advice given to you by your health care provider. Make sure you discuss any questions you have with your health care provider. Document Released: 08/02/2009 Document Revised: 03/13/2016 Document Reviewed: 11/10/2014 Elsevier Interactive Patient Education  2017 [employer].

## 2019-09-20 NOTE — Progress Notes (Signed)
Subjective:   [name] is a 85 y.o. female who presents for Medicare Annual (Subsequent) preventive examination.  Review of Systems: N/A   This visit is being conducted through telemedicine via telephone at the nurse health advisor's home address due to the COVID-19 pandemic. This patient has given me verbal consent via doximity to conduct this visit, patient states they are participating from their home address. Patient and myself are on the telephone call. There is no referral for this visit. Some vital signs may be absent or patient reported.    Patient identification: identified by name, DOB, and current address   Cardiac Risk Factors include: advanced age (>71men, >30 women);hypertension;dyslipidemia     Objective:     Vitals: There were no vitals taken for this visit.  There is no height or weight on file to calculate BMI.  Advanced Directives 09/20/2019 09/08/2018 06/23/2017 06/20/2016 05/30/2013 05/24/2013  Does Patient Have a Medical Advance Directive? Yes Yes Yes Yes Patient has advance directive, copy in chart Patient has advance directive, copy in chart  Type of Advance Directive [place];Living will Living will Living will [place];Living will [place];Living will [place];Living will  Does patient want to make changes to medical advance directive? - - - No - Patient declined - -  Copy of [place] in Chart? No - copy requested - - No - copy requested - -  Pre-existing out of facility DNR order (yellow form or pink MOST form) - - - - No No    Tobacco Social History   Tobacco Use  Smoking Status Never Smoker  Smokeless Tobacco Never Used     Counseling given: Not Answered   Clinical Intake:  Pre-visit preparation completed: Yes  Pain : No/denies pain     Nutritional Risks: None Diabetes: No  How often do you need to have someone help you when you read  instructions, pamphlets, or other written materials from your doctor or pharmacy?: 1 - Never What is the last grade level you completed in school?: 12th  Interpreter Needed?: No  Information entered by :: CJohnson, LPN  Past Medical History:  Diagnosis Date  . Erosive esophagitis   . Gastric ulcer   . Gastritis   . GERD (gastroesophageal reflux disease)   . HLD (hyperlipidemia)   . HTN (hypertension)   . Osteoarthritis   . Osteopenia   . Prediabetes    pt does not check cbg at home   Past Surgical History:  Procedure Laterality Date  . APPENDECTOMY  yrs ago  . arthroscopic knee surgery Left yrs ago  . benign nodule removed from neck  05-21-2013  . EYE SURGERY Bilateral 9- 9-19-2011and 07-2010   cataract lens replacement  . KNEE SURGERY Left 05/2009   to cut nerves  . left knee replacement  09-30-2005  . nuclear cardiolyte  10/08  . right knee replacement Right 08-10-2013  . right shoulder surgery  2005  . TOTAL KNEE REVISION Left 05/30/2013   Procedure: LEFT TOTAL KNEE REVISION;  Surgeon: Mauri Pole, MD;  Location: [facility];  Service: Orthopedics;  Laterality: Left;   Family History  Problem Relation Age of Onset  . Coronary artery disease Father 41  . Hypertension Sister        x 4  . Breast cancer Sister 92  . Breast cancer Daughter 27   Social History   Socioeconomic History  . Marital status: Widowed  Spouse name: Not on file  . Number of children: Not on file  . Years of education: Not on file  . Highest education level: Not on file  Occupational History  . Not on file  Social Needs  . Financial resource strain: Not hard at all  . Food insecurity    Worry: Never true    Inability: Never true  . Transportation needs    Medical: No    Non-medical: No  Tobacco Use  . Smoking status: Never Smoker  . Smokeless tobacco: Never Used  Substance and Sexual Activity  . Alcohol use: No    Alcohol/week: 0.0 standard drinks  . Drug use: No  . Sexual  activity: Not Currently  Lifestyle  . Physical activity    Days per week: 0 days    Minutes per session: 0 min  . Stress: To some extent  Relationships  . Social Herbalist on phone: Not on file    Gets together: Not on file    Attends religious service: Not on file    Active member of club or organization: Not on file    Attends meetings of clubs or organizations: Not on file    Relationship status: Not on file  Other Topics Concern  . Not on file  Social History Narrative   Full code, has living will, HCPOA children (reviewed 05/2014)    Outpatient Encounter Medications as of 09/20/2019  Medication Sig  . aspirin 81 MG tablet Take 81 mg by mouth daily.  . betamethasone dipropionate (DIPROLENE) 0.05 % cream Apply topically 2 (two) times daily.  . colchicine 0.6 MG tablet 1 tab po x 1 then repeat in 2 hours, continue daily until flare resolved. (Patient taking differently: as needed. 1 tab po x 1 then repeat in 2 hours, continue daily until flare resolved, as needed.)  . dicyclomine (BENTYL) 10 MG capsule Take 10 mg by mouth 3 (three) times daily as needed (abdomen pain).  Marland Kitchen doxycycline (VIBRA-TABS) 100 MG tablet Take 1 tablet (100 mg total) by mouth 2 (two) times daily.  . fish oil-omega-3 fatty acids 1000 MG capsule Take 2 g by mouth 2 (two) times daily.   . Glucosamine Sulfate 500 MG CAPS Take 500 mg by mouth 2 (two) times daily.   . hydroxypropyl methylcellulose (ISOPTO TEARS) 2.5 % ophthalmic solution Place 1 drop into both eyes 3 (three) times daily as needed (dry eyes).  Marland Kitchen lisinopril-hydrochlorothiazide (ZESTORETIC) 20-12.5 MG tablet Take 2 tablets by mouth daily.  . meloxicam (MOBIC) 7.5 MG tablet Take 1 tablet (7.5 mg total) by mouth daily as needed for pain.  . metoprolol tartrate (LOPRESSOR) 25 MG tablet TAKE 1 TABLET TWICE DAILY (TAKE WITH 50MG  TABLET FOR A TOTAL OF 75MG  TWICE DAILY)  . metoprolol tartrate (LOPRESSOR) 50 MG tablet TAKE 1 TABLET TWICE DAILY (TAKE  WITH 25MG  TABLET FOR A TOTAL OF 75MG  TWICE DAILY)  . Multiple Vitamin (MULTIVITAMIN) tablet Take 1 tablet by mouth daily.    . mupirocin ointment (BACTROBAN) 2 %   . omeprazole (PRILOSEC) 40 MG capsule Take 1 capsule (40 mg total) by mouth daily.  Marland Kitchen OVER THE COUNTER MEDICATION I caps 1 tab bid  . sertraline (ZOLOFT) 50 MG tablet Take 1 tablet (50 mg total) by mouth at bedtime.  . triamcinolone cream (KENALOG) 0.5 % Apply 1 application topically 2 (two) times daily. (Patient taking differently: Apply 1 application topically as needed. )   No facility-administered encounter medications on file  as of 09/20/2019.     Activities of Daily Living In your present state of health, do you have any difficulty performing the following activities: 09/20/2019  Hearing? Y  Comment wears hearing aids  Vision? Y  Comment has macular degeneration  Difficulty concentrating or making decisions? N  Walking or climbing stairs? N  Dressing or bathing? N  Doing errands, shopping? N  Preparing Food and eating ? N  Using the Toilet? N  In the past six months, have you accidently leaked urine? N  Do you have problems with loss of bowel control? N  Managing your Medications? N  Managing your Finances? N  Housekeeping or managing your Housekeeping? N  Some recent data might be hidden    Patient Care Team: Jinny Sanders, MD as PCP - General    Assessment:   This is a routine wellness examination for [facility].  Exercise Activities and Dietary recommendations Current Exercise Habits: The patient does not participate in regular exercise at present, Exercise limited by: None identified  Goals    . Increase water intake     Starting 09/08/2018, I will attempt to drink 6-8 glasses of water daily.     . Patient Stated     09/20/2019, I will maintain and continue medications as prescribed.        Fall Risk Fall Risk  09/20/2019 09/08/2018 09/08/2018 06/23/2017 06/20/2016  Falls in the past year? 0 0 0 No No   Comment - - Emmi Telephone Survey: data to providers prior to load - -  Number falls in past yr: 0 - - - -  Injury with Fall? 0 - - - -  Risk for fall due to : Medication side effect - - - -  Follow up Falls evaluation completed;Falls prevention discussed - - - -   Is the patient's home free of loose throw rugs in walkways, pet beds, electrical cords, etc?   yes      Grab bars in the bathroom? no      Handrails on the stairs?   yes      Adequate lighting?   yes  Timed Get Up and Go performed: N/A  Depression Screen PHQ 2/9 Scores 09/20/2019 09/08/2018 06/23/2017 04/03/2017  PHQ - 2 Score 2 0 1 3  PHQ- 9 Score 2 0 1 8     Cognitive Function MMSE - Mini Mental State Exam 09/20/2019 09/08/2018 06/23/2017  Orientation to time 5 5 5   Orientation to Place 5 5 5   Registration 3 3 3   Attention/ Calculation 5 0 0  Recall 3 3 3   Language- name 2 objects - 0 0  Language- repeat 1 1 1   Language- follow 3 step command - 3 3  Language- read & follow direction - 0 0  Write a sentence - 0 0  Copy design - 0 0  Total score - 20 20  Mini Cog  Mini-Cog screen was completed. Maximum score is 22. A value of 0 denotes this part of the MMSE was not completed or the patient failed this part of the Mini-Cog screening.       Immunization History  Administered Date(s) Administered  . Fluad Quad(high Dose 65+) 07/21/2019  . H1N1 11/06/2008  . Influenza Split 07/22/2012  . Influenza Whole 07/21/2007, 07/20/2008, 08/20/2010  . Influenza,inj,Quad PF,6+ Mos 06/26/2017, 09/08/2018  . Influenza-Unspecified 07/20/2016  . Pneumococcal Conjugate-13 06/13/2014  . Pneumococcal Polysaccharide-23 10/20/1998  . Td 10/20/1998, 11/06/2008  . Zoster 10/20/2008  Qualifies for Shingles Vaccine? yes  Screening Tests Health Maintenance  Topic Date Due  . TETANUS/TDAP  09/19/2020 (Originally 11/06/2018)  . INFLUENZA VACCINE  Completed  . DEXA SCAN  Completed  . PNA vac Low Risk Adult  Completed    Cancer  Screenings: Lung: Low Dose CT Chest recommended if Age 43-80 years, 30 pack-year currently smoking OR have quit w/in 15years. Patient does not qualify. Breast:  Up to date on Mammogram? Yes, completed 11/17/2018  Up to date of Bone Density/Dexa? Yes completed 11/10/2017 Colorectal: no longer required  Additional Screenings:  Hepatitis C Screening: N/A,     Plan:   Patient will maintain and continue medications as prescribed.    I have personally reviewed and noted the following in the patient's chart:   . Medical and social history . Use of alcohol, tobacco or illicit drugs  . Current medications and supplements . Functional ability and status . Nutritional status . Physical activity . Advanced directives . List of other physicians . Hospitalizations, surgeries, and ER visits in previous 12 months . Vitals . Screenings to include cognitive, depression, and falls . Referrals and appointments  In addition, I have reviewed and discussed with patient certain preventive protocols, quality metrics, and best practice recommendations. A written personalized care plan for preventive services as well as general preventive health recommendations were provided to patient.     Andrez Grime, LPN  [id number]

## 2019-09-20 NOTE — Progress Notes (Signed)
PCP notes:  Health Maintenance: Will check with insurance about Shingrix Decline Tdap- insurance/financial   Abnormal Screenings: none   Patient concerns: none   Nurse concerns: none   Next PCP appt.: 09/30/2019 @ 2:20 pm

## 2019-09-20 NOTE — Telephone Encounter (Signed)
-----   Message from Ellamae Sia sent at 09/07/2019 10:24 AM EST ----- Regarding: Lab orders for Tuesday, 12.1.20  AWV lab orders, please.

## 2019-09-21 DIAGNOSIS — S61401A Unspecified open wound of right hand, initial encounter: Secondary | ICD-10-CM | POA: Diagnosis not present

## 2019-09-21 DIAGNOSIS — L821 Other seborrheic keratosis: Secondary | ICD-10-CM | POA: Diagnosis not present

## 2019-09-21 DIAGNOSIS — L570 Actinic keratosis: Secondary | ICD-10-CM | POA: Diagnosis not present

## 2019-09-21 DIAGNOSIS — D485 Neoplasm of uncertain behavior of skin: Secondary | ICD-10-CM | POA: Diagnosis not present

## 2019-09-21 DIAGNOSIS — C4492 Squamous cell carcinoma of skin, unspecified: Secondary | ICD-10-CM

## 2019-09-21 HISTORY — DX: Squamous cell carcinoma of skin, unspecified: C44.92

## 2019-09-23 ENCOUNTER — Encounter: Payer: Medicare Other | Admitting: Family Medicine

## 2019-09-28 DIAGNOSIS — M79645 Pain in left finger(s): Secondary | ICD-10-CM | POA: Diagnosis not present

## 2019-09-28 DIAGNOSIS — M65342 Trigger finger, left ring finger: Secondary | ICD-10-CM | POA: Diagnosis not present

## 2019-09-28 DIAGNOSIS — M65322 Trigger finger, left index finger: Secondary | ICD-10-CM | POA: Diagnosis not present

## 2019-09-28 DIAGNOSIS — M65331 Trigger finger, right middle finger: Secondary | ICD-10-CM | POA: Diagnosis not present

## 2019-09-30 ENCOUNTER — Encounter: Payer: Self-pay | Admitting: Family Medicine

## 2019-09-30 ENCOUNTER — Ambulatory Visit (INDEPENDENT_AMBULATORY_CARE_PROVIDER_SITE_OTHER): Payer: Medicare Other | Admitting: Family Medicine

## 2019-09-30 ENCOUNTER — Other Ambulatory Visit: Payer: Self-pay

## 2019-09-30 VITALS — BP 180/70 | HR 62 | Temp 98.8°F | Ht 63.5 in | Wt 157.2 lb

## 2019-09-30 DIAGNOSIS — R7303 Prediabetes: Secondary | ICD-10-CM | POA: Diagnosis not present

## 2019-09-30 DIAGNOSIS — M109 Gout, unspecified: Secondary | ICD-10-CM

## 2019-09-30 DIAGNOSIS — I10 Essential (primary) hypertension: Secondary | ICD-10-CM | POA: Diagnosis not present

## 2019-09-30 DIAGNOSIS — E559 Vitamin D deficiency, unspecified: Secondary | ICD-10-CM | POA: Diagnosis not present

## 2019-09-30 DIAGNOSIS — F320 Major depressive disorder, single episode, mild: Secondary | ICD-10-CM | POA: Diagnosis not present

## 2019-09-30 NOTE — Progress Notes (Signed)
Chief Complaint  Patient presents with  . Annual Exam    Part 2    History of Present Illness: HPI  The patient presents for annual medicare wellness, complete physical and review of chronic health problems. He/She also has the following acute concerns today: elevated BP  The patient saw a LPN or RN for medicare wellness visit.  Prevention and wellness was reviewed in detail. Note reviewed and important notes copied below.  Health Maintenance: Will check with insurance about Shingrix Decline Tdap- insurance/financial Abnormal Screenings: none   09/30/19  MDD  Stable control on sertraline  GAD: increased irritability, now on 50 mg daily dose.. she feel mildly anxious   Clinical Support from 09/20/2019 in [place] at [facility] Total Score  2      Hypertension:  Poor control  Despite increased dose of  lisinopril, metoprolol BP Readings from Last 3 Encounters:  09/30/19 (!) 180/70  09/06/19 135/60  08/23/19 (!) 160/78  Using medication without problems or lightheadedness:  none Chest pain with exertion:none Edema:none Short of breath:none Average home BPs: 150/90 Other issues: Prediabetes  Lab Results  Component Value Date   HGBA1C 6.1 09/20/2019   PUD,erosive esophagitis on omeprazole  Gout:   occ flares, not very bothersome. Use colchine prn.   Vit D: improved on supplement.   This visit occurred during the SARS-CoV-2 public health emergency.  Safety protocols were in place, including screening questions prior to the visit, additional usage of staff PPE, and extensive cleaning of exam room while observing appropriate contact time as indicated for disinfecting solutions.   COVID 19 screen:  No recent travel or known exposure to COVID19 The patient denies respiratory symptoms of COVID 19 at this time. The importance of social distancing was discussed today.   Wt Readings from Last 3 Encounters:  09/30/19 157 lb 4 oz (71.3 kg)  09/06/19  159 lb 12 oz (72.5 kg)  08/23/19 156 lb 8 oz (71 kg)     Review of Systems  Constitutional: Negative for chills and fever.  HENT: Negative for congestion and ear pain.   Eyes: Negative for pain and redness.  Respiratory: Negative for cough and shortness of breath.   Cardiovascular: Negative for chest pain, palpitations and leg swelling.  Gastrointestinal: Negative for abdominal pain, blood in stool, constipation, diarrhea, nausea and vomiting.  Genitourinary: Negative for dysuria.  Musculoskeletal: Negative for falls and myalgias.  Skin: Negative for rash.  Neurological: Negative for dizziness.  Psychiatric/Behavioral: Negative for depression. The patient is not nervous/anxious.       Past Medical History:  Diagnosis Date  . Erosive esophagitis   . Gastric ulcer   . Gastritis   . GERD (gastroesophageal reflux disease)   . HLD (hyperlipidemia)   . HTN (hypertension)   . Osteoarthritis   . Osteopenia   . Prediabetes    pt does not check cbg at home    reports that she has never smoked. She has never used smokeless tobacco. She reports that she does not drink alcohol or use drugs.   Current Outpatient Medications:  .  aspirin 81 MG tablet, Take 81 mg by mouth daily., Disp: , Rfl:  .  betamethasone dipropionate (DIPROLENE) 0.05 % cream, Apply topically 2 (two) times daily., Disp: 30 g, Rfl: 0 .  colchicine 0.6 MG tablet, 1 tab po x 1 then repeat in 2 hours, continue daily until flare resolved., Disp: 30 tablet, Rfl: 1 .  dicyclomine (BENTYL) 10 MG capsule, Take  10 mg by mouth 3 (three) times daily as needed (abdomen pain)., Disp: , Rfl:  .  fish oil-omega-3 fatty acids 1000 MG capsule, Take 2 g by mouth 2 (two) times daily. , Disp: , Rfl:  .  Glucosamine Sulfate 500 MG CAPS, Take 500 mg by mouth 2 (two) times daily. , Disp: , Rfl:  .  hydroxypropyl methylcellulose (ISOPTO TEARS) 2.5 % ophthalmic solution, Place 1 drop into both eyes 3 (three) times daily as needed (dry eyes).,  Disp: , Rfl:  .  lisinopril-hydrochlorothiazide (ZESTORETIC) 20-12.5 MG tablet, Take 2 tablets by mouth daily., Disp: 180 tablet, Rfl: 3 .  meloxicam (MOBIC) 7.5 MG tablet, Take 1 tablet (7.5 mg total) by mouth daily as needed for pain., Disp: 15 tablet, Rfl: 0 .  metoprolol tartrate (LOPRESSOR) 25 MG tablet, TAKE 1 TABLET TWICE DAILY (TAKE WITH 50MG  TABLET FOR A TOTAL OF 75MG  TWICE DAILY), Disp: 180 tablet, Rfl: 0 .  metoprolol tartrate (LOPRESSOR) 50 MG tablet, TAKE 1 TABLET TWICE DAILY (TAKE WITH 25MG  TABLET FOR A TOTAL OF 75MG  TWICE DAILY), Disp: 180 tablet, Rfl: 0 .  Multiple Vitamin (MULTIVITAMIN) tablet, Take 1 tablet by mouth daily.  , Disp: , Rfl:  .  mupirocin ointment (BACTROBAN) 2 %, , Disp: , Rfl:  .  omeprazole (PRILOSEC) 40 MG capsule, Take 1 capsule (40 mg total) by mouth daily., Disp: 90 capsule, Rfl: 0 .  OVER THE COUNTER MEDICATION, I caps 1 tab bid, Disp: , Rfl:  .  sertraline (ZOLOFT) 50 MG tablet, Take 1 tablet (50 mg total) by mouth at bedtime., Disp: 30 tablet, Rfl: 5 .  triamcinolone cream (KENALOG) 0.5 %, Apply 1 application topically 2 (two) times daily. (Patient taking differently: Apply 1 application topically as needed. ), Disp: 15 g, Rfl: 0   Observations/Objective: Blood pressure (!) 180/70, pulse 62, temperature 98.8 F (37.1 C), temperature source Temporal, height 5' 3.5" (1.613 m), weight 157 lb 4 oz (71.3 kg), SpO2 97 %.  Physical Exam Constitutional:      General: She is not in acute distress.    Appearance: Normal appearance. She is well-developed. She is not ill-appearing or toxic-appearing.  HENT:     Head: Normocephalic.     Right Ear: Hearing, tympanic membrane, ear canal and external ear normal. Tympanic membrane is not erythematous, retracted or bulging.     Left Ear: Hearing, tympanic membrane, ear canal and external ear normal. Tympanic membrane is not erythematous, retracted or bulging.     Nose: No mucosal edema or rhinorrhea.     Right Sinus:  No maxillary sinus tenderness or frontal sinus tenderness.     Left Sinus: No maxillary sinus tenderness or frontal sinus tenderness.     Mouth/Throat:     Pharynx: Uvula midline.  Eyes:     General: Lids are normal. Lids are everted, no foreign bodies appreciated.     Conjunctiva/sclera: Conjunctivae normal.     Pupils: Pupils are equal, round, and reactive to light.  Neck:     Thyroid: No thyroid mass or thyromegaly.     Vascular: No carotid bruit.     Trachea: Trachea normal.  Cardiovascular:     Rate and Rhythm: Normal rate and regular rhythm.     Pulses: Normal pulses.     Heart sounds: Normal heart sounds, S1 normal and S2 normal. No murmur. No friction rub. No gallop.   Pulmonary:     Effort: Pulmonary effort is normal. No tachypnea or respiratory distress.  Breath sounds: Normal breath sounds. No decreased breath sounds, wheezing, rhonchi or rales.  Abdominal:     General: Bowel sounds are normal.     Palpations: Abdomen is soft.     Tenderness: There is no abdominal tenderness.  Musculoskeletal:     Cervical back: Normal range of motion and neck supple.  Skin:    General: Skin is warm and dry.     Findings: No rash.  Neurological:     Mental Status: She is alert.  Psychiatric:        Mood and Affect: Mood is not anxious or depressed.        Speech: Speech normal.        Behavior: Behavior normal. Behavior is cooperative.        Thought Content: Thought content normal.        Judgment: Judgment normal.      Assessment and Plan The patient's preventative maintenance and recommended screening tests for an annual wellness exam were reviewed in full today. Brought up to date unless services declined.  Counselled on the importance of diet, exercise, and its role in overall health and mortality. The patient's FH and SH was reviewed, including their home life, tobacco status, and drug and alcohol status.   Vaccines: Uptodate with vaccines PNA, prevnar,  zostavax,flu.. except td Colon: incomplete colonoscopy 2009.Marland Kitchen Not indicated given age. Pap/DVE not indicated given age 76: likes to continue, sister and 2 daughters with breast cancer. Last nml 10/2017, will plan every 2 years. Non smoker  DEXA: last in 2019 osteopenia stable      Eliezer Lofts, MD

## 2019-09-30 NOTE — Patient Instructions (Addendum)
Follow BP at home daily x 1-2 weeks and call with measurements.. we will tolerate 140-150/90... if > we will need to adjust the medication.

## 2019-09-30 NOTE — Assessment & Plan Note (Signed)
Tolearable control per pt on current dose of  Sertraline.

## 2019-09-30 NOTE — Assessment & Plan Note (Signed)
occ flare. Last uric acid almost at goal. Continue colchicine prn.

## 2019-09-30 NOTE — Assessment & Plan Note (Signed)
Resolved with supplementation 

## 2019-09-30 NOTE — Assessment & Plan Note (Signed)
Previously well controlled ... ? If anxiewty contributing to increased BP. Follow at home x 1 week. If not at goal need to add medication.

## 2019-09-30 NOTE — Assessment & Plan Note (Signed)
Stable control. 

## 2019-10-10 NOTE — Assessment & Plan Note (Signed)
Stop lisinopril 20/25 mg daily, change to lisinopril/HCTZ 20/12.5 mg  2 TABLETS daily.  Follow BP daily.Marland Kitchen goal BP < 150/90.

## 2019-10-10 NOTE — Assessment & Plan Note (Signed)
Increase sertraline to 50 mg daily ( you can use up what you have by taking 2 tabs)

## 2019-10-27 DIAGNOSIS — L570 Actinic keratosis: Secondary | ICD-10-CM | POA: Diagnosis not present

## 2019-10-27 DIAGNOSIS — L821 Other seborrheic keratosis: Secondary | ICD-10-CM | POA: Diagnosis not present

## 2019-10-27 DIAGNOSIS — I781 Nevus, non-neoplastic: Secondary | ICD-10-CM | POA: Diagnosis not present

## 2019-10-27 DIAGNOSIS — D1801 Hemangioma of skin and subcutaneous tissue: Secondary | ICD-10-CM | POA: Diagnosis not present

## 2019-10-27 DIAGNOSIS — D044 Carcinoma in situ of skin of scalp and neck: Secondary | ICD-10-CM | POA: Diagnosis not present

## 2019-10-27 DIAGNOSIS — L578 Other skin changes due to chronic exposure to nonionizing radiation: Secondary | ICD-10-CM | POA: Diagnosis not present

## 2019-10-27 DIAGNOSIS — L814 Other melanin hyperpigmentation: Secondary | ICD-10-CM | POA: Diagnosis not present

## 2019-10-31 ENCOUNTER — Other Ambulatory Visit: Payer: Self-pay | Admitting: Family Medicine

## 2019-10-31 DIAGNOSIS — Z1231 Encounter for screening mammogram for malignant neoplasm of breast: Secondary | ICD-10-CM

## 2019-11-16 IMAGING — MG DIGITAL SCREENING BILATERAL MAMMOGRAM WITH TOMO AND CAD
8 series · 9 of 24 positions shown · non-contrast
Comparison: Previous exam(s).

CLINICAL DATA: Screening.

EXAM:
DIGITAL SCREENING BILATERAL MAMMOGRAM WITH TOMO AND CAD

[L MLO synth-2D]
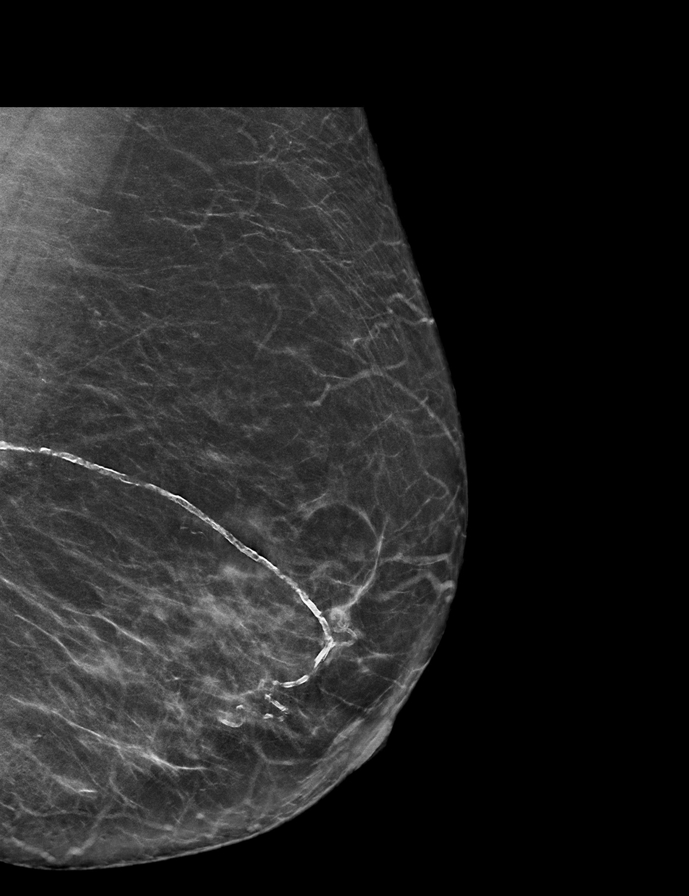

[R MLO synth-2D]
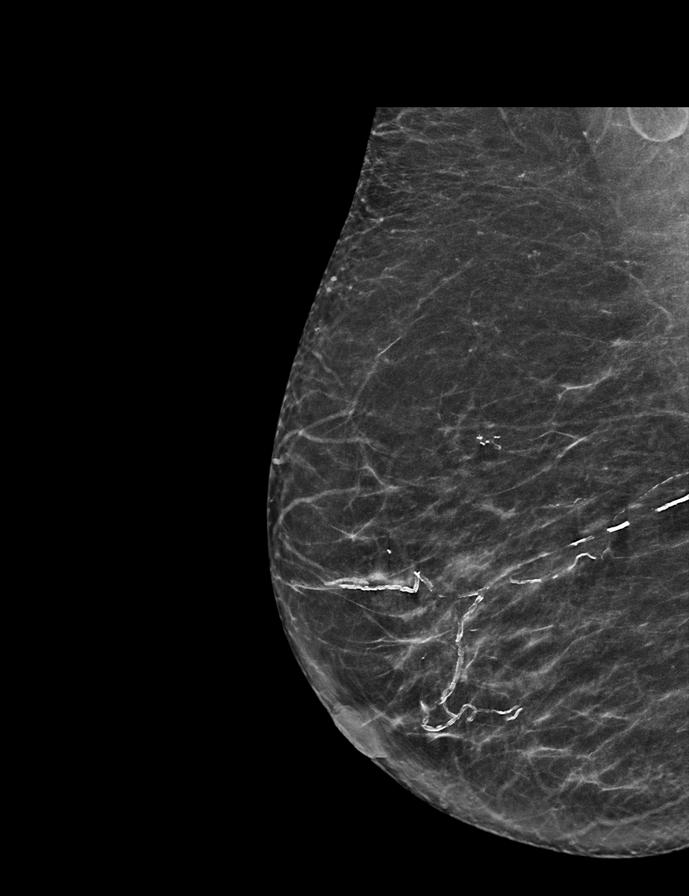

[L CC synth-2D]
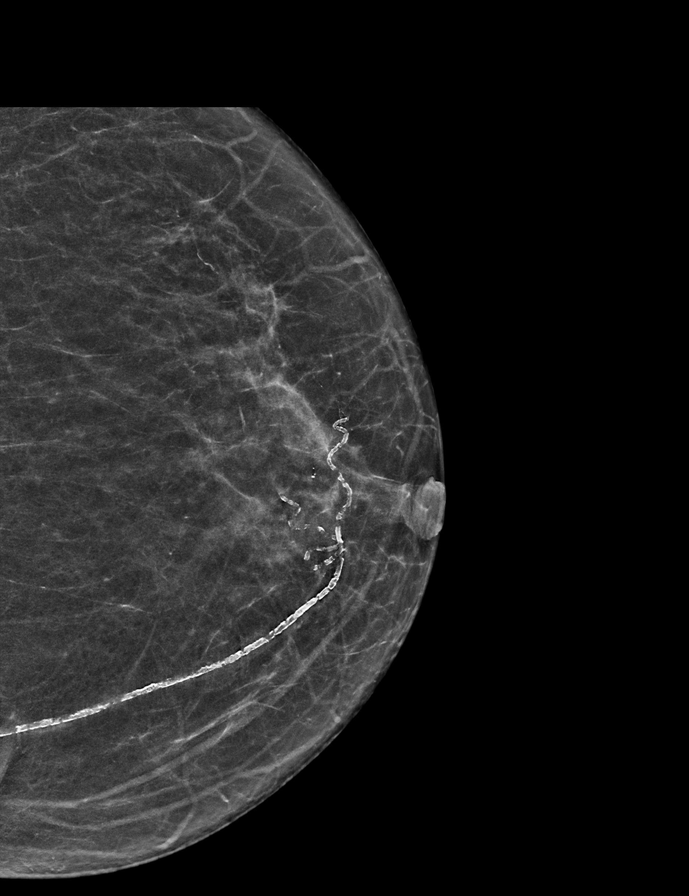

[R CC synth-2D]
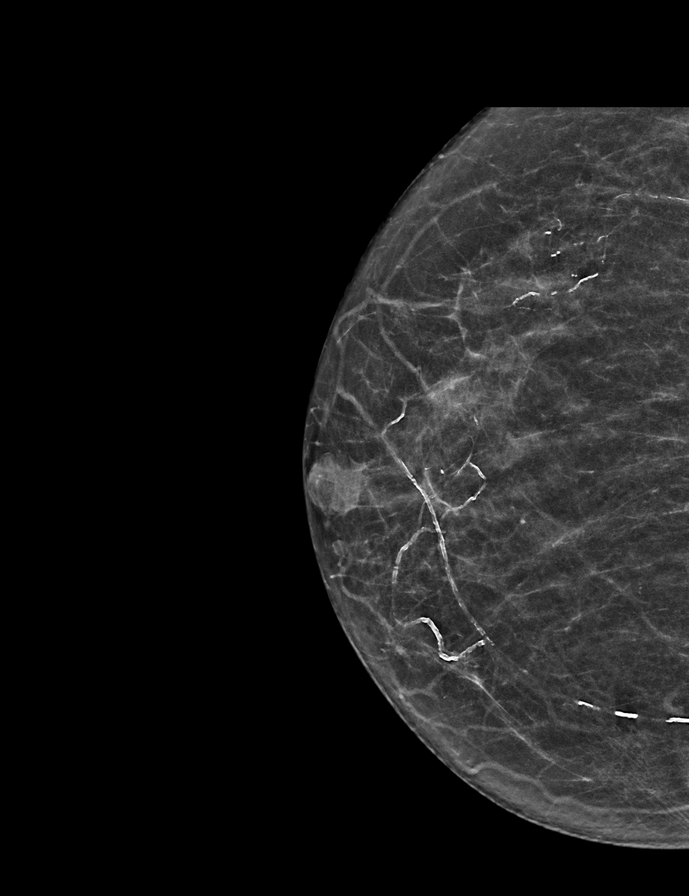

[R CC tomo · 2 of 52 frames shown]
[frame 17/52]
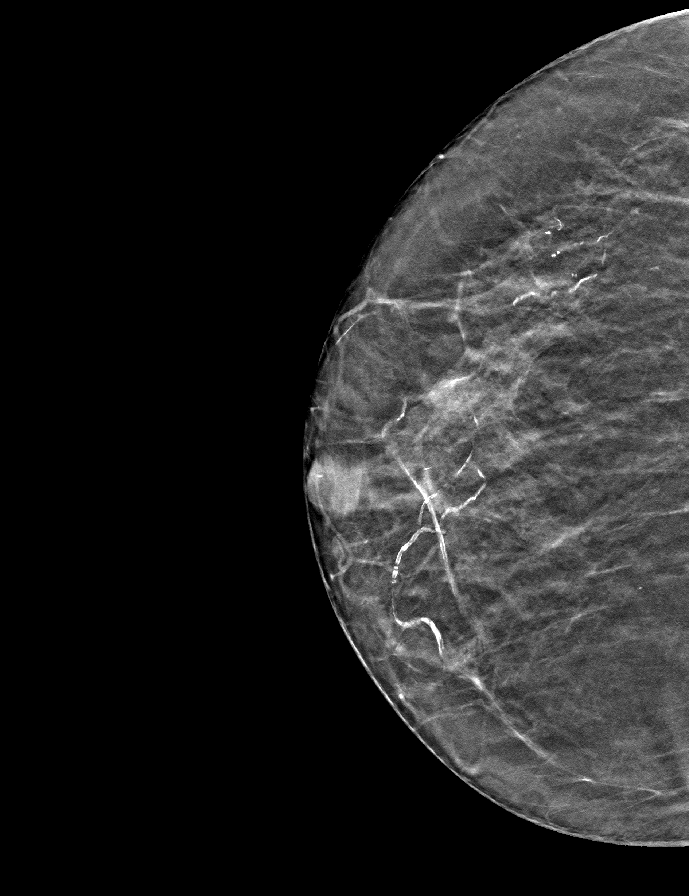
[frame 27/52]
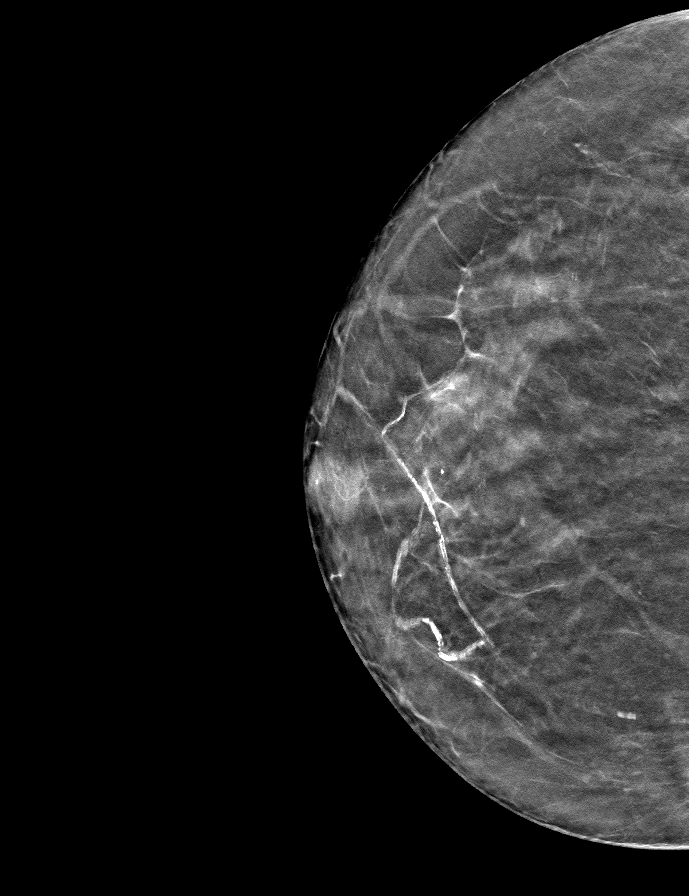

[L CC tomo · tomo slice 29/57.0]
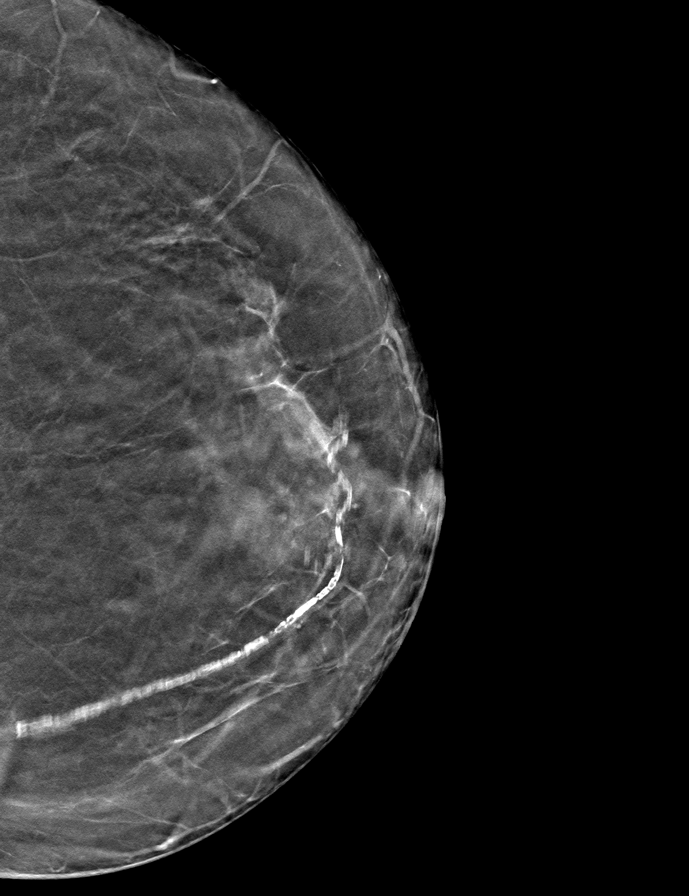

[R MLO tomo · tomo slice 30/59.0]
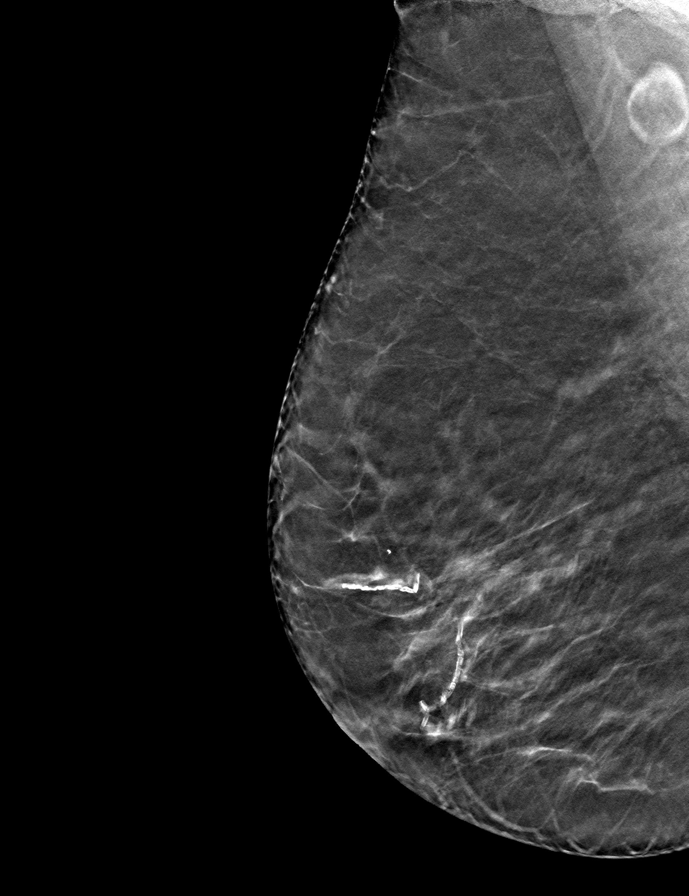

[L MLO tomo · tomo slice 33/66.0]
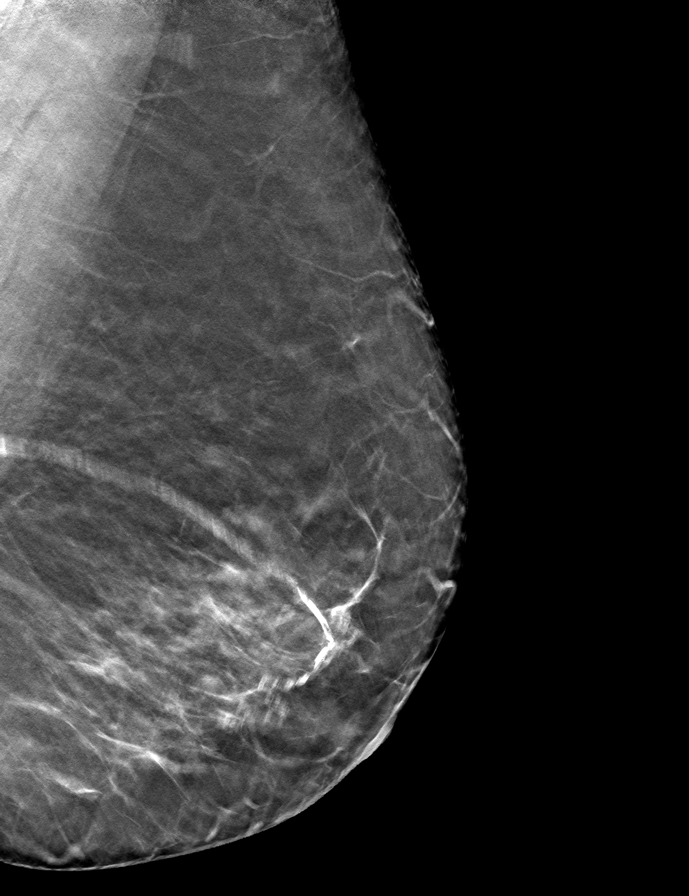

[9 of 24 positions shown; findings below may reference images not displayed]

ACR Breast Density Category b: There are scattered areas of
fibroglandular density.
FINDINGS: There are no findings suspicious for malignancy. Images were
processed with CAD.
IMPRESSION: No mammographic evidence of malignancy. A result letter of this
screening mammogram will be mailed directly to the patient.

RECOMMENDATION:
Screening mammogram in one year. (Code:CN-[id number])

BI-RADS CATEGORY  1: Negative.

## 2019-12-01 DIAGNOSIS — L570 Actinic keratosis: Secondary | ICD-10-CM | POA: Diagnosis not present

## 2019-12-01 DIAGNOSIS — L905 Scar conditions and fibrosis of skin: Secondary | ICD-10-CM | POA: Diagnosis not present

## 2019-12-01 DIAGNOSIS — Z86007 Personal history of in-situ neoplasm of skin: Secondary | ICD-10-CM | POA: Diagnosis not present

## 2019-12-03 ENCOUNTER — Other Ambulatory Visit: Payer: Self-pay | Admitting: Family Medicine

## 2019-12-09 ENCOUNTER — Ambulatory Visit: Payer: Medicare Other

## 2019-12-30 ENCOUNTER — Other Ambulatory Visit: Payer: Self-pay

## 2019-12-30 ENCOUNTER — Ambulatory Visit (INDEPENDENT_AMBULATORY_CARE_PROVIDER_SITE_OTHER): Payer: Medicare Other | Admitting: Family Medicine

## 2019-12-30 ENCOUNTER — Encounter: Payer: Self-pay | Admitting: Family Medicine

## 2019-12-30 VITALS — BP 144/64 | HR 68 | Temp 98.6°F | Ht 63.5 in | Wt 161.5 lb

## 2019-12-30 DIAGNOSIS — I10 Essential (primary) hypertension: Secondary | ICD-10-CM | POA: Diagnosis not present

## 2019-12-30 DIAGNOSIS — M19049 Primary osteoarthritis, unspecified hand: Secondary | ICD-10-CM | POA: Insufficient documentation

## 2019-12-30 DIAGNOSIS — M19042 Primary osteoarthritis, left hand: Secondary | ICD-10-CM

## 2019-12-30 DIAGNOSIS — M19041 Primary osteoarthritis, right hand: Secondary | ICD-10-CM | POA: Diagnosis not present

## 2019-12-30 NOTE — Progress Notes (Signed)
Chief Complaint  Patient presents with  . Follow-up    HTN    History of Present Illness: HPI   86 year old female presents for follow up HTN  Hypertension:   BP is at tolerable level given age < 150/90 on metoprolol and lisinopril HCTZ.  Much better control than at last OV  BP Readings from Last 3 Encounters:  12/30/19 (!) 144/64  09/30/19 (!) 180/70  09/06/19 135/60  Using medication without problems or lightheadedness: none Chest pain with exertion:none Edema: stable Short of breath: stable Average home BPs: rarely > 160. Other issues:  Not using any med for arthritis joint pain.   This visit occurred during the SARS-CoV-2 public health emergency.  Safety protocols were in place, including screening questions prior to the visit, additional usage of staff PPE, and extensive cleaning of exam room while observing appropriate contact time as indicated for disinfecting solutions.   COVID 19 screen:  No recent travel or known exposure to COVID19 The patient denies respiratory symptoms of COVID 19 at this time. The importance of social distancing was discussed today.     Review of Systems  Constitutional: Negative for chills and fever.  HENT: Negative for congestion and ear pain.   Eyes: Negative for pain and redness.  Respiratory: Negative for cough and shortness of breath.   Cardiovascular: Negative for chest pain, palpitations and leg swelling.  Gastrointestinal: Negative for abdominal pain, blood in stool, constipation, diarrhea, nausea and vomiting.  Genitourinary: Negative for dysuria.  Musculoskeletal: Positive for back pain and joint pain. Negative for falls and myalgias.  Skin: Negative for rash.  Neurological: Negative for dizziness.  Psychiatric/Behavioral: Negative for depression. The patient is not nervous/anxious.       Past Medical History:  Diagnosis Date  . Erosive esophagitis   . Gastric ulcer   . Gastritis   . GERD (gastroesophageal reflux  disease)   . HLD (hyperlipidemia)   . HTN (hypertension)   . Osteoarthritis   . Osteopenia   . Prediabetes    pt does not check cbg at home    reports that she has never smoked. She has never used smokeless tobacco. She reports that she does not drink alcohol or use drugs.   Current Outpatient Medications:  .  aspirin 81 MG tablet, Take 81 mg by mouth daily., Disp: , Rfl:  .  betamethasone dipropionate (DIPROLENE) 0.05 % cream, Apply topically 2 (two) times daily., Disp: 30 g, Rfl: 0 .  colchicine 0.6 MG tablet, 1 tab po x 1 then repeat in 2 hours, continue daily until flare resolved., Disp: 30 tablet, Rfl: 1 .  dicyclomine (BENTYL) 10 MG capsule, Take 10 mg by mouth 3 (three) times daily as needed (abdomen pain)., Disp: , Rfl:  .  fish oil-omega-3 fatty acids 1000 MG capsule, Take 2 g by mouth 2 (two) times daily. , Disp: , Rfl:  .  Glucosamine Sulfate 500 MG CAPS, Take 500 mg by mouth 2 (two) times daily. , Disp: , Rfl:  .  hydroxypropyl methylcellulose (ISOPTO TEARS) 2.5 % ophthalmic solution, Place 1 drop into both eyes 3 (three) times daily as needed (dry eyes)., Disp: , Rfl:  .  lisinopril-hydrochlorothiazide (ZESTORETIC) 20-12.5 MG tablet, Take 2 tablets by mouth daily., Disp: 180 tablet, Rfl: 3 .  meloxicam (MOBIC) 7.5 MG tablet, Take 1 tablet (7.5 mg total) by mouth daily as needed for pain., Disp: 15 tablet, Rfl: 0 .  metoprolol tartrate (LOPRESSOR) 25 MG tablet, TAKE 1 TABLET  TWICE DAILY (TAKE WITH 50MG  TABLET FOR A TOTAL OF 75MG  TWICE DAILY), Disp: 180 tablet, Rfl: 1 .  metoprolol tartrate (LOPRESSOR) 50 MG tablet, TAKE 1 TABLET TWICE DAILY (TAKE WITH 25MG  TABLET FOR A TOTAL OF 75MG  TWICE DAILY), Disp: 180 tablet, Rfl: 1 .  Multiple Vitamin (MULTIVITAMIN) tablet, Take 1 tablet by mouth daily.  , Disp: , Rfl:  .  mupirocin ointment (BACTROBAN) 2 %, , Disp: , Rfl:  .  omeprazole (PRILOSEC) 40 MG capsule, TAKE 1 CAPSULE EVERY DAY, Disp: 90 capsule, Rfl: 1 .  OVER THE COUNTER  MEDICATION, I caps 1 tab bid, Disp: , Rfl:  .  sertraline (ZOLOFT) 50 MG tablet, Take 1 tablet (50 mg total) by mouth at bedtime., Disp: 30 tablet, Rfl: 5 .  triamcinolone cream (KENALOG) 0.5 %, Apply 1 application topically 2 (two) times daily. (Patient taking differently: Apply 1 application topically as needed. ), Disp: 15 g, Rfl: 0   Observations/Objective: Blood pressure (!) 144/64, pulse 68, temperature 98.6 F (37 C), temperature source Temporal, height 5' 3.5" (1.613 m), weight 161 lb 8 oz (73.3 kg), SpO2 98 %.  Physical Exam Constitutional:      General: She is not in acute distress.    Appearance: Normal appearance. She is well-developed. She is not ill-appearing or toxic-appearing.     Comments: Elderly female in NAD  HENT:     Head: Normocephalic.     Right Ear: Hearing, tympanic membrane, ear canal and external ear normal. Tympanic membrane is not erythematous, retracted or bulging.     Left Ear: Hearing, tympanic membrane, ear canal and external ear normal. Tympanic membrane is not erythematous, retracted or bulging.     Nose: No mucosal edema or rhinorrhea.     Right Sinus: No maxillary sinus tenderness or frontal sinus tenderness.     Left Sinus: No maxillary sinus tenderness or frontal sinus tenderness.     Mouth/Throat:     Pharynx: Uvula midline.  Eyes:     General: Lids are normal. Lids are everted, no foreign bodies appreciated.     Conjunctiva/sclera: Conjunctivae normal.     Pupils: Pupils are equal, round, and reactive to light.  Neck:     Thyroid: No thyroid mass or thyromegaly.     Vascular: No carotid bruit.     Trachea: Trachea normal.  Cardiovascular:     Rate and Rhythm: Normal rate and regular rhythm.     Pulses: Normal pulses.     Heart sounds: Normal heart sounds, S1 normal and S2 normal. No murmur. No friction rub. No gallop.   Pulmonary:     Effort: Pulmonary effort is normal. No tachypnea or respiratory distress.     Breath sounds: Normal breath  sounds. No decreased breath sounds, wheezing, rhonchi or rales.  Abdominal:     General: Bowel sounds are normal.     Palpations: Abdomen is soft.     Tenderness: There is no abdominal tenderness.  Musculoskeletal:     Cervical back: Normal range of motion and neck supple.  Skin:    General: Skin is warm and dry.     Findings: No rash.  Neurological:     Mental Status: She is alert.  Psychiatric:        Mood and Affect: Mood is not anxious or depressed.        Speech: Speech normal.        Behavior: Behavior normal. Behavior is cooperative.  Thought Content: Thought content normal.        Judgment: Judgment normal.      Assessment and Plan   HYPERTENSION, BENIGN Well controlled. Continue current medication.   Osteoarthritis, hand  Use tylenol and diclofenac gel.     Eliezer Lofts, MD

## 2019-12-30 NOTE — Assessment & Plan Note (Signed)
Use tylenol and diclofenac gel.

## 2019-12-30 NOTE — Patient Instructions (Signed)
Use extra strength Tylenol 1- 2 times daily for pain. Can also use topical diclofenac gel.

## 2019-12-30 NOTE — Assessment & Plan Note (Signed)
Well controlled. Continue current medication.  

## 2020-01-13 ENCOUNTER — Other Ambulatory Visit: Payer: Self-pay

## 2020-01-13 ENCOUNTER — Ambulatory Visit
Admission: RE | Admit: 2020-01-13 | Discharge: 2020-01-13 | Disposition: A | Discharge status: Home / Self Care | Payer: Medicare Other | Source: Ambulatory Visit | Attending: Family Medicine | Admitting: Family Medicine

## 2020-01-13 DIAGNOSIS — Z1231 Encounter for screening mammogram for malignant neoplasm of breast: Secondary | ICD-10-CM

## 2020-02-02 ENCOUNTER — Ambulatory Visit: Payer: Medicare Other | Admitting: Dermatology

## 2020-02-15 ENCOUNTER — Ambulatory Visit: Payer: Medicare Other | Admitting: Dermatology

## 2020-02-19 ENCOUNTER — Other Ambulatory Visit: Payer: Self-pay | Admitting: Family Medicine

## 2020-02-22 ENCOUNTER — Other Ambulatory Visit: Payer: Self-pay

## 2020-02-22 ENCOUNTER — Encounter: Payer: Self-pay | Admitting: Dermatology

## 2020-02-22 ENCOUNTER — Ambulatory Visit (INDEPENDENT_AMBULATORY_CARE_PROVIDER_SITE_OTHER): Payer: Medicare Other | Admitting: Dermatology

## 2020-02-22 DIAGNOSIS — L578 Other skin changes due to chronic exposure to nonionizing radiation: Secondary | ICD-10-CM | POA: Diagnosis not present

## 2020-02-22 DIAGNOSIS — Z86007 Personal history of in-situ neoplasm of skin: Secondary | ICD-10-CM

## 2020-02-22 DIAGNOSIS — L570 Actinic keratosis: Secondary | ICD-10-CM

## 2020-02-22 NOTE — Patient Instructions (Addendum)
Recommend daily broad spectrum sunscreen SPF 30+ to sun-exposed areas, reapply every 2 hours as needed. Call for new or changing lesions.   Prior to procedure, discussed risks of blister formation, small wound, skin dyspigmentation, or rare scar following cryotherapy.  Cryotherapy Aftercare  . Wash gently with soap and water everyday.   . Apply Vaseline and Band-Aid daily until healed.   

## 2020-02-22 NOTE — Progress Notes (Signed)
   Follow-Up Visit   Subjective  [name] is a 86 y.o. female who presents for the following: AK follow up (L. Vertex scalp  and R dorsum hand) and Hx of SCCis (Mid vertex scalp).  The following portions of the chart were reviewed this encounter and updated as appropriate:     Review of Systems:  No other skin or systemic complaints except as noted in HPI or Assessment and Plan.  Objective  Well appearing patient in no apparent distress; mood and affect are within normal limits.  A focused examination was performed including scalp and hands. Relevant physical exam findings are noted in the Assessment and Plan.  Objective  Left Nasal dorsum, Left Tip of Nose: Erythematous thin papules/macules with gritty scale- L nasal dorsum, L nasal tip R hand dorsum and L vertex scalp are clear  Objective  Mid vertex scalp: Well healed scar with no evidence of recurrence. BX proven 09/21/19.  Treated with EDC   Assessment & Plan  AK (actinic keratosis) (2) Left Nasal dorsum; Left Tip of Nose  Cryotherapy today Prior to procedure, discussed risks of blister formation, small wound, skin dyspigmentation, or rare scar following cryotherapy.    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 Observe for recurrence. Call clinic for new or changing lesions.  Recommend regular skin exams, daily broad-spectrum spf 30+ sunscreen use, and photoprotection.  Marland Kitchen  Actinic Damage - diffuse scaly erythematous macules with underlying dyspigmentation - Recommend daily broad spectrum  sunscreen SPF 30+ to sun-exposed areas, reapply every 2 hours as needed.  - Call for new or changing lesions.   Return in about 6 months (around 08/24/2020) for TBSE, AK Follow up.  Marene Lenz, CMA, am acting as scribe for Brendolyn Patty, MD .  Documentation: I have reviewed the above documentation for accuracy and completeness, and I agree with the above.  Brendolyn Patty, MD

## 2020-03-21 ENCOUNTER — Other Ambulatory Visit: Payer: Self-pay

## 2020-03-21 ENCOUNTER — Encounter (INDEPENDENT_AMBULATORY_CARE_PROVIDER_SITE_OTHER): Payer: Self-pay | Admitting: Otolaryngology

## 2020-03-21 ENCOUNTER — Ambulatory Visit (INDEPENDENT_AMBULATORY_CARE_PROVIDER_SITE_OTHER): Payer: Medicare Other | Admitting: Otolaryngology

## 2020-03-21 VITALS — Temp 97.3°F

## 2020-03-21 DIAGNOSIS — H903 Sensorineural hearing loss, bilateral: Secondary | ICD-10-CM | POA: Diagnosis not present

## 2020-03-21 DIAGNOSIS — H6123 Impacted cerumen, bilateral: Secondary | ICD-10-CM | POA: Diagnosis not present

## 2020-03-21 NOTE — Progress Notes (Signed)
HPI: [name] is a 86 y.o. female who presents for evaluation of wax buildup in both ear canals.  Today she is only wearing the left hearing aid as the right ear canal is completely occluded.  She was last seen here in October of last year..  Past Medical History:  Diagnosis Date  . Erosive esophagitis   . Gastric ulcer   . Gastritis   . GERD (gastroesophageal reflux disease)   . HLD (hyperlipidemia)   . HTN (hypertension)   . Osteoarthritis   . Osteopenia   . Prediabetes    pt does not check cbg at home  . Squamous cell carcinoma of skin 09/21/2019   Mid vertex scalp  Squamous Cell Carcinoma in Situ   Past Surgical History:  Procedure Laterality Date  . APPENDECTOMY  yrs ago  . arthroscopic knee surgery Left yrs ago  . benign nodule removed from neck  05-21-2013  . EYE SURGERY Bilateral 9- 9-19-2011and 07-2010   cataract lens replacement  . KNEE SURGERY Left 05/2009   to cut nerves  . left knee replacement  09-30-2005  . nuclear cardiolyte  10/08  . right knee replacement Right 08-10-2013  . right shoulder surgery  2005  . TOTAL KNEE REVISION Left 05/30/2013   Procedure: LEFT TOTAL KNEE REVISION;  Surgeon: Mauri Pole, MD;  Location: [facility];  Service: Orthopedics;  Laterality: Left;   Social History   Socioeconomic History  . Marital status: Widowed    Spouse name: Not on file  . Number of children: Not on file  . Years of education: Not on file  . Highest education level: Not on file  Occupational History  . Not on file  Tobacco Use  . Smoking status: Never Smoker  . Smokeless tobacco: Never Used  Substance and Sexual Activity  . Alcohol use: No    Alcohol/week: 0.0 standard drinks  . Drug use: No  . Sexual activity: Not Currently  Other Topics Concern  . Not on file  Social History Narrative   Full code, has living will, HCPOA children (reviewed 05/2014)   Social Determinants of Health   Financial Resource Strain: Low Risk   . Difficulty of Paying  Living Expenses: Not hard at all  Food Insecurity: No Food Insecurity  . Worried About Charity fundraiser in the Last Year: Never true  . Ran Out of Food in the Last Year: Never true  Transportation Needs: No Transportation Needs  . Lack of Transportation (Medical): No  . Lack of Transportation (Non-Medical): No  Physical Activity: Inactive  . Days of Exercise per Week: 0 days  . Minutes of Exercise per Session: 0 min  Stress: Stress Concern Present  . Feeling of Stress : To some extent  Social Connections:   . Frequency of Communication with Friends and Family:   . Frequency of Social Gatherings with Friends and Family:   . Attends Religious Services:   . Active Member of Clubs or Organizations:   . Attends Archivist Meetings:   Marland Kitchen Marital Status:    Family History  Problem Relation Age of Onset  . Coronary artery disease Father 30  . Hypertension Sister        x 4  . Breast cancer Sister 53  . Breast cancer Daughter 61   No Known Allergies Prior to Admission medications   Medication Sig Start Date End Date Taking? Authorizing Provider  aspirin 81 MG tablet Take 81 mg by mouth daily.  Yes [provider]  betamethasone dipropionate (DIPROLENE) 0.05 % cream Apply topically 2 (two) times daily. 01/07/18  Yes Bedsole, Amy E, MD  colchicine 0.6 MG tablet 1 tab po x 1 then repeat in 2 hours, continue daily until flare resolved. 03/22/19  Yes Bedsole, Amy E, MD  dicyclomine (BENTYL) 10 MG capsule Take 10 mg by mouth 3 (three) times daily as needed (abdomen pain).   Yes [provider]  fish oil-omega-3 fatty acids 1000 MG capsule Take 2 g by mouth 2 (two) times daily.    Yes [provider]  Glucosamine Sulfate 500 MG CAPS Take 500 mg by mouth 2 (two) times daily.    Yes [provider]  hydroxypropyl methylcellulose (ISOPTO TEARS) 2.5 % ophthalmic solution Place 1 drop into both eyes 3 (three) times daily as needed (dry eyes).   Yes  [provider]  lisinopril-hydrochlorothiazide (ZESTORETIC) 20-12.5 MG tablet Take 2 tablets by mouth daily. 08/23/19  Yes Bedsole, Amy E, MD  meloxicam (MOBIC) 7.5 MG tablet Take 1 tablet (7.5 mg total) by mouth daily as needed for pain. 03/22/19  Yes Bedsole, Amy E, MD  metoprolol tartrate (LOPRESSOR) 25 MG tablet TAKE 1 TABLET TWICE DAILY (TAKE WITH 50MG  TABLET FOR A TOTAL OF 75MG  TWICE DAILY) 12/05/19  Yes Bedsole, Amy E, MD  metoprolol tartrate (LOPRESSOR) 50 MG tablet TAKE 1 TABLET TWICE DAILY (TAKE WITH 25MG  TABLET FOR A TOTAL OF 75MG  TWICE DAILY) 12/05/19  Yes Bedsole, Amy E, MD  Multiple Vitamin (MULTIVITAMIN) tablet Take 1 tablet by mouth daily.     Yes [provider]  mupirocin ointment (BACTROBAN) 2 %  11/17/18  Yes [provider]  omeprazole (PRILOSEC) 40 MG capsule TAKE 1 CAPSULE EVERY DAY 12/05/19  Yes Bedsole, Amy E, MD  OVER THE COUNTER MEDICATION I caps 1 tab bid   Yes [provider]  sertraline (ZOLOFT) 50 MG tablet TAKE 1 TABLET BY MOUTH EVERYDAY AT BEDTIME 02/20/20  Yes Bedsole, Amy E, MD  triamcinolone cream (KENALOG) 0.5 % Apply 1 application topically 2 (two) times daily. Patient taking differently: Apply 1 application topically as needed.  01/12/18  Yes Bedsole, Amy E, MD     Positive ROS: Otherwise negative  All other systems have been reviewed and were otherwise negative with the exception of those mentioned in the HPI and as above.  Physical Exam: Constitutional: Alert, well-appearing, no acute distress Ears: External ears without lesions or tenderness. Ear canals are completely occluded with cerumen on both sides right side worse than left.  This was removed with forceps and suction.  TMs were clear bilaterally.. Nasal: External nose without lesions. Clear nasal passages Oral: Oropharynx clear. Neck: No palpable adenopathy or masses Respiratory: Breathing comfortably  Skin: No facial/neck lesions or rash noted.  Cerumen impaction  removal  Date/Time: 03/21/2020 2:14 PM Performed by: Rozetta Nunnery, MD Authorized by: Rozetta Nunnery, MD   Consent:    Consent obtained:  Verbal   Consent given by:  Patient   Risks discussed:  Pain and bleeding Procedure details:    Location:  L ear and R ear   Procedure type: suction and forceps   Post-procedure details:    Inspection:  TM intact and canal normal   Hearing quality:  Improved   Patient tolerance of procedure:  Tolerated well, no immediate complications Comments:     TMs are clear bilaterally.    Assessment: Bilateral cerumen impactions and patient with hearing aids  Plan: Recommended  follow-up in 6 months for recheck and cleaning.  Radene Journey, MD

## 2020-05-15 ENCOUNTER — Other Ambulatory Visit: Payer: Self-pay | Admitting: Family Medicine

## 2020-06-03 ENCOUNTER — Other Ambulatory Visit: Payer: Self-pay | Admitting: Family Medicine

## 2020-08-02 ENCOUNTER — Other Ambulatory Visit: Payer: Self-pay | Admitting: Family Medicine

## 2020-08-05 DIAGNOSIS — Z23 Encounter for immunization: Secondary | ICD-10-CM | POA: Diagnosis not present

## 2020-08-23 DIAGNOSIS — Z23 Encounter for immunization: Secondary | ICD-10-CM | POA: Diagnosis not present

## 2020-08-27 ENCOUNTER — Telehealth: Payer: Self-pay | Admitting: Family Medicine

## 2020-08-27 NOTE — Telephone Encounter (Signed)
[name] notified that refill is available at [facility] for pickup.

## 2020-08-27 NOTE — Telephone Encounter (Signed)
Pt daugther called in wanted to know about getting a refill for the Lisinopril (Zestoretic) 20-12.5 mg . She was taking 2 pills daily and she is out.   Pharmacy - [place]

## 2020-08-30 ENCOUNTER — Ambulatory Visit (INDEPENDENT_AMBULATORY_CARE_PROVIDER_SITE_OTHER): Payer: Medicare Other | Admitting: Dermatology

## 2020-08-30 ENCOUNTER — Other Ambulatory Visit: Payer: Self-pay

## 2020-08-30 DIAGNOSIS — L570 Actinic keratosis: Secondary | ICD-10-CM | POA: Diagnosis not present

## 2020-08-30 DIAGNOSIS — D485 Neoplasm of uncertain behavior of skin: Secondary | ICD-10-CM

## 2020-08-30 DIAGNOSIS — D044 Carcinoma in situ of skin of scalp and neck: Secondary | ICD-10-CM

## 2020-08-30 DIAGNOSIS — L814 Other melanin hyperpigmentation: Secondary | ICD-10-CM | POA: Diagnosis not present

## 2020-08-30 DIAGNOSIS — L821 Other seborrheic keratosis: Secondary | ICD-10-CM

## 2020-08-30 DIAGNOSIS — Z1283 Encounter for screening for malignant neoplasm of skin: Secondary | ICD-10-CM | POA: Diagnosis not present

## 2020-08-30 DIAGNOSIS — D229 Melanocytic nevi, unspecified: Secondary | ICD-10-CM

## 2020-08-30 DIAGNOSIS — D1800 Hemangioma unspecified site: Secondary | ICD-10-CM

## 2020-08-30 DIAGNOSIS — L578 Other skin changes due to chronic exposure to nonionizing radiation: Secondary | ICD-10-CM | POA: Diagnosis not present

## 2020-08-30 NOTE — Progress Notes (Signed)
Follow-Up Visit   Subjective  [name] is a 86 y.o. female who presents for the following: TBSE (6 mo total body today. Hx SCCIS, mid vertex scalp. ) and Follow-up (AK f/u, left nasal dorsum, left tip of nose).   The following portions of the chart were reviewed this encounter and updated as appropriate: Tobacco  Allergies  Meds  Problems  Med Hx  Surg Hx  Fam Hx      Review of Systems: No other skin or systemic complaints except as noted in HPI or Assessment and Plan.   Objective  Well appearing patient in no apparent distress; mood and affect are within normal limits.  A full examination was performed including scalp, head, eyes, ears, nose, lips, neck, chest, axillae, abdomen, back, buttocks, bilateral upper extremities, bilateral lower extremities, hands, feet, fingers, toes, fingernails, and toenails. All findings within normal limits unless otherwise noted below.  Objective  mid vertex scalp: 0.6 cm scaly pink papule adjacent to scar  R/o recurrent SCCIS       Objective  Right dorsal Hand x 1, right forearm x 1, right medial thigh x 1, nose x 4, right posterior base of neck x 1, (8): Erythematous thin papules/macules with gritty scale.   Assessment & Plan  Neoplasm of uncertain behavior of skin mid vertex scalp  Skin / nail biopsy Type of biopsy: tangential   Informed consent: discussed and consent obtained   Timeout: patient name, date of birth, surgical site, and procedure verified   Procedure prep:  Patient was prepped and draped in usual sterile fashion Prep type:  Isopropyl alcohol Anesthesia: the lesion was anesthetized in a standard fashion   Anesthetic:  1% lidocaine w/ epinephrine 1-100,000 buffered w/ 8.4% NaHCO3 Instrument used: flexible razor blade   Hemostasis achieved with: pressure, aluminum chloride and electrodesiccation   Outcome: patient tolerated procedure well   Post-procedure details: sterile dressing applied and wound care  instructions given   Dressing type: bandage and petrolatum    Specimen 1 - Surgical pathology Differential Diagnosis: R/o recurrent SCCIS Check Margins: No 0.6 cm scaly pink papule adjacent to scar    AK (actinic keratosis) (8) Right dorsal Hand x 1, right forearm x 1, right medial thigh x 1, nose x 4, right posterior base of neck x 1,  Prior to procedure, discussed risks of blister formation, small wound, skin dyspigmentation, or rare scar following cryotherapy.    Destruction of lesion - Right dorsal Hand x 1, right forearm x 1, right medial thigh x 1, nose x 4, right posterior base of neck x 1,  Destruction method: cryotherapy   Informed consent: discussed and consent obtained   Lesion destroyed using liquid nitrogen: Yes   Outcome: patient tolerated procedure well with no complications   Post-procedure details: wound care instructions given    Lentigines - Scattered tan macules - Discussed due to sun exposure - Benign, observe - Call for any changes  Seborrheic Keratoses - Stuck-on, waxy, tan-brown papules and plaques  - Discussed benign etiology and prognosis. - Observe - Call for any changes  Melanocytic Nevi - Tan-brown and/or pink-flesh-colored symmetric macules and papules - Benign appearing on exam today - Observation - Call clinic for new or changing moles - Recommend daily use of broad spectrum spf 30+ sunscreen to sun-exposed areas.   Hemangiomas - Red papules - Discussed benign nature - Observe - Call for any changes  Actinic Damage - Chronic, secondary to cumulative UV/sun exposure - diffuse scaly  erythematous macules with underlying dyspigmentation - Recommend daily broad spectrum sunscreen SPF 30+ to sun-exposed areas, reapply every 2 hours as needed.  - Call for new or changing lesions.  Skin cancer screening performed today.  Return in about 2 months (around 10/30/2020) for recheck AKs.   I, Harriett Sine, CMA, am acting as scribe for  Forest Gleason, MD.  Documentation: I have reviewed the above documentation for accuracy and completeness, and I agree with the above.  Forest Gleason, MD

## 2020-08-30 NOTE — Patient Instructions (Signed)

## 2020-09-04 NOTE — Progress Notes (Signed)
Skin , mid vertex scalp SQUAMOUS CELL CARCINOMA IN SITU RECURRENT --> Recommend Mohs surgery due to recurrence  Dr. Jerilynn Mages called 09/04/2020 at 1:55 PM. No answer, Left voicemail  MAs please call

## 2020-09-05 ENCOUNTER — Telehealth: Payer: Self-pay

## 2020-09-05 NOTE — Telephone Encounter (Signed)
Daughter Thayer Headings has been advised of biopsy results. They would like to be scheduled with [facility]. Will place referral today.

## 2020-09-05 NOTE — Telephone Encounter (Signed)
-----   Message from [place], MD sent at 09/04/2020  1:56 PM EST ----- Skin , mid vertex scalp SQUAMOUS CELL CARCINOMA IN SITU RECURRENT --> Recommend Mohs surgery due to recurrence  Dr. Jerilynn Mages called 09/04/2020 at 1:55 PM. No answer, Left voicemail  MAs please call

## 2020-09-10 ENCOUNTER — Other Ambulatory Visit: Payer: Self-pay

## 2020-09-10 ENCOUNTER — Encounter: Payer: Self-pay | Admitting: Dermatology

## 2020-09-10 DIAGNOSIS — D044 Carcinoma in situ of skin of scalp and neck: Secondary | ICD-10-CM

## 2020-09-10 NOTE — Progress Notes (Signed)
Referral for MOHs to [facility] 

## 2020-09-20 ENCOUNTER — Telehealth: Payer: Self-pay | Admitting: Family Medicine

## 2020-09-20 DIAGNOSIS — R7303 Prediabetes: Secondary | ICD-10-CM

## 2020-09-20 DIAGNOSIS — E559 Vitamin D deficiency, unspecified: Secondary | ICD-10-CM

## 2020-09-20 DIAGNOSIS — M109 Gout, unspecified: Secondary | ICD-10-CM

## 2020-09-20 DIAGNOSIS — E782 Mixed hyperlipidemia: Secondary | ICD-10-CM

## 2020-09-20 NOTE — Telephone Encounter (Signed)
-----   Message from Ellamae Sia sent at 09/04/2020  2:24 PM EST ----- Regarding: lab orders for Friday, 12.3.21 Patient is scheduled for CPX labs, please order future labs, Thanks , Karna Christmas

## 2020-09-21 ENCOUNTER — Other Ambulatory Visit: Payer: Self-pay

## 2020-09-21 ENCOUNTER — Other Ambulatory Visit (INDEPENDENT_AMBULATORY_CARE_PROVIDER_SITE_OTHER): Payer: Medicare Other

## 2020-09-21 ENCOUNTER — Ambulatory Visit (INDEPENDENT_AMBULATORY_CARE_PROVIDER_SITE_OTHER): Payer: Medicare Other

## 2020-09-21 DIAGNOSIS — M109 Gout, unspecified: Secondary | ICD-10-CM | POA: Diagnosis not present

## 2020-09-21 DIAGNOSIS — E559 Vitamin D deficiency, unspecified: Secondary | ICD-10-CM

## 2020-09-21 DIAGNOSIS — Z0000 Encounter for general adult medical examination without abnormal findings: Secondary | ICD-10-CM

## 2020-09-21 DIAGNOSIS — E782 Mixed hyperlipidemia: Secondary | ICD-10-CM | POA: Diagnosis not present

## 2020-09-21 DIAGNOSIS — R7303 Prediabetes: Secondary | ICD-10-CM | POA: Diagnosis not present

## 2020-09-21 LAB — COMPREHENSIVE METABOLIC PANEL
ALT: 9 U/L (ref 0–35)
AST: 14 U/L (ref 0–37)
Albumin: 4 g/dL (ref 3.5–5.2)
Alkaline Phosphatase: 51 U/L (ref 39–117)
BUN: 29 mg/dL — ABNORMAL HIGH (ref 6–23)
CO2: 27 mEq/L (ref 19–32)
Calcium: 9.7 mg/dL (ref 8.4–10.5)
Chloride: 102 mEq/L (ref 96–112)
Creatinine, Ser: 1.03 mg/dL (ref 0.40–1.20)
GFR: 47.05 mL/min — ABNORMAL LOW (ref >60.00)
Glucose, Bld: 104 mg/dL — ABNORMAL HIGH (ref 70–99)
Potassium: 3.8 mEq/L (ref 3.5–5.1)
Sodium: 139 mEq/L (ref 135–145)
Total Bilirubin: 0.4 mg/dL (ref 0.2–1.2)
Total Protein: 7.2 g/dL (ref 6.0–8.3)

## 2020-09-21 LAB — VITAMIN D 25 HYDROXY (VIT D DEFICIENCY, FRACTURES): VITD: 42.31 ng/mL (ref 30.00–100.00)

## 2020-09-21 LAB — URIC ACID: Uric Acid, Serum: 8.7 mg/dL — ABNORMAL HIGH (ref 2.4–7.0)

## 2020-09-21 LAB — HEMOGLOBIN A1C: Hgb A1c MFr Bld: 6 % (ref 4.6–6.5)

## 2020-09-21 NOTE — Patient Instructions (Signed)
Ms. [name] , Thank you for taking time to come for your Medicare Wellness Visit. I appreciate your ongoing commitment to your health goals. Please review the following plan we discussed and let me know if I can assist you in the future.   Screening recommendations/referrals: Colonoscopy: no longer required Mammogram: Up to date, completed 01/13/2020, due 12/2020 Bone Density: due, discuss with provider at upcoming physical Recommended yearly ophthalmology/optometry visit for glaucoma screening and checkup Recommended yearly dental visit for hygiene and checkup  Vaccinations: Influenza vaccine: Up to date, completed 08/23/2020, due 05/2021 Pneumococcal vaccine: Completed series Tdap vaccine: decline (insurance) Shingles vaccine: due, check with your insurance regarding coverage if interested   Covid-19:Completed series  Advanced directives: Please bring a copy of your POA (Power of War) and/or Living Will to your next appointment.   Conditions/risks identified: hypertension  Next appointment: Follow up in one year for your annual wellness visit    Preventive Care 38 Years and Older, Female Preventive care refers to lifestyle choices and visits with your health care provider that can promote health and wellness. What does preventive care include?  A yearly physical exam. This is also called an annual well check.  Dental exams once or twice a year.  Routine eye exams. Ask your health care provider how often you should have your eyes checked.  Personal lifestyle choices, including:  Daily care of your teeth and gums.  Regular physical activity.  Eating a healthy diet.  Avoiding tobacco and drug use.  Limiting alcohol use.  Practicing safe sex.  Taking low-dose aspirin every day.  Taking vitamin and mineral supplements as recommended by your health care provider. What happens during an annual well check? The services and screenings done by your health care provider  during your annual well check will depend on your age, overall health, lifestyle risk factors, and family history of disease. Counseling  Your health care provider may ask you questions about your:  Alcohol use.  Tobacco use.  Drug use.  Emotional well-being.  Home and relationship well-being.  Sexual activity.  Eating habits.  History of falls.  Memory and ability to understand (cognition).  Work and work Statistician.  Reproductive health. Screening  You may have the following tests or measurements:  Height, weight, and BMI.  Blood pressure.  Lipid and cholesterol levels. These may be checked every 5 years, or more frequently if you are over 40 years old.  Skin check.  Lung cancer screening. You may have this screening every year starting at age 33 if you have a 30-pack-year history of smoking and currently smoke or have quit within the past 15 years.  Fecal occult blood test (FOBT) of the stool. You may have this test every year starting at age 41.  Flexible sigmoidoscopy or colonoscopy. You may have a sigmoidoscopy every 5 years or a colonoscopy every 10 years starting at age 31.  Hepatitis C blood test.  Hepatitis B blood test.  Sexually transmitted disease (STD) testing.  Diabetes screening. This is done by checking your blood sugar (glucose) after you have not eaten for a while (fasting). You may have this done every 1-3 years.  Bone density scan. This is done to screen for osteoporosis. You may have this done starting at age 51.  Mammogram. This may be done every 1-2 years. Talk to your health care provider about how often you should have regular mammograms. Talk with your health care provider about your test results, treatment options, and if necessary, the  need for more tests. Vaccines  Your health care provider may recommend certain vaccines, such as:  Influenza vaccine. This is recommended every year.  Tetanus, diphtheria, and acellular pertussis  (Tdap, Td) vaccine. You may need a Td booster every 10 years.  Zoster vaccine. You may need this after age 61.  Pneumococcal 13-valent conjugate (PCV13) vaccine. One dose is recommended after age 19.  Pneumococcal polysaccharide (PPSV23) vaccine. One dose is recommended after age 67. Talk to your health care provider about which screenings and vaccines you need and how often you need them. This information is not intended to replace advice given to you by your health care provider. Make sure you discuss any questions you have with your health care provider. Document Released: 11/02/2015 Document Revised: 06/25/2016 Document Reviewed: 08/07/2015 Elsevier Interactive Patient Education  2017 [place] Prevention in the Home Falls can cause injuries. They can happen to people of all ages. There are many things you can do to make your home safe and to help prevent falls. What can I do on the outside of my home?  Regularly fix the edges of walkways and driveways and fix any cracks.  Remove anything that might make you trip as you walk through a door, such as a raised step or threshold.  Trim any bushes or trees on the path to your home.  Use bright outdoor lighting.  Clear any walking paths of anything that might make someone trip, such as rocks or tools.  Regularly check to see if handrails are loose or broken. Make sure that both sides of any steps have handrails.  Any raised decks and porches should have guardrails on the edges.  Have any leaves, snow, or ice cleared regularly.  Use sand or salt on walking paths during winter.  Clean up any spills in your garage right away. This includes oil or grease spills. What can I do in the bathroom?  Use night lights.  Install grab bars by the toilet and in the tub and shower. Do not use towel bars as grab bars.  Use non-skid mats or decals in the tub or shower.  If you need to sit down in the shower, use a plastic, non-slip  stool.  Keep the floor dry. Clean up any water that spills on the floor as soon as it happens.  Remove soap buildup in the tub or shower regularly.  Attach bath mats securely with double-sided non-slip rug tape.  Do not have throw rugs and other things on the floor that can make you trip. What can I do in the bedroom?  Use night lights.  Make sure that you have a light by your bed that is easy to reach.  Do not use any sheets or blankets that are too big for your bed. They should not hang down onto the floor.  Have a firm chair that has side arms. You can use this for support while you get dressed.  Do not have throw rugs and other things on the floor that can make you trip. What can I do in the kitchen?  Clean up any spills right away.  Avoid walking on wet floors.  Keep items that you use a lot in easy-to-reach places.  If you need to reach something above you, use a strong step stool that has a grab bar.  Keep electrical cords out of the way.  Do not use floor polish or wax that makes floors slippery. If you must use wax,  use non-skid floor wax.  Do not have throw rugs and other things on the floor that can make you trip. What can I do with my stairs?  Do not leave any items on the stairs.  Make sure that there are handrails on both sides of the stairs and use them. Fix handrails that are broken or loose. Make sure that handrails are as long as the stairways.  Check any carpeting to make sure that it is firmly attached to the stairs. Fix any carpet that is loose or worn.  Avoid having throw rugs at the top or bottom of the stairs. If you do have throw rugs, attach them to the floor with carpet tape.  Make sure that you have a light switch at the top of the stairs and the bottom of the stairs. If you do not have them, ask someone to add them for you. What else can I do to help prevent falls?  Wear shoes that:  Do not have high heels.  Have rubber bottoms.  Are  comfortable and fit you well.  Are closed at the toe. Do not wear sandals.  If you use a stepladder:  Make sure that it is fully opened. Do not climb a closed stepladder.  Make sure that both sides of the stepladder are locked into place.  Ask someone to hold it for you, if possible.  Clearly mark and make sure that you can see:  Any grab bars or handrails.  First and last steps.  Where the edge of each step is.  Use tools that help you move around (mobility aids) if they are needed. These include:  Canes.  Walkers.  Scooters.  Crutches.  Turn on the lights when you go into a dark area. Replace any light bulbs as soon as they burn out.  Set up your furniture so you have a clear path. Avoid moving your furniture around.  If any of your floors are uneven, fix them.  If there are any pets around you, be aware of where they are.  Review your medicines with your doctor. Some medicines can make you feel dizzy. This can increase your chance of falling. Ask your doctor what other things that you can do to help prevent falls. This information is not intended to replace advice given to you by your health care provider. Make sure you discuss any questions you have with your health care provider. Document Released: 08/02/2009 Document Revised: 03/13/2016 Document Reviewed: 11/10/2014 Elsevier Interactive Patient Education  2017 [employer].

## 2020-09-21 NOTE — Progress Notes (Signed)
PCP notes:  Health Maintenance: Dexa- due   Abnormal Screenings: none   Patient concerns: none   Nurse concerns: none   Next PCP appt.: 09/25/2020 @ 11:20 am

## 2020-09-21 NOTE — Progress Notes (Signed)
Subjective:   [name] is a 86 y.o. female who presents for Medicare Annual (Subsequent) preventive examination.  Review of Systems: N/A      I connected with the patient today by telephone and verified that I am speaking with the correct person using two identifiers. Location patient: home Location nurse: work Persons participating in the telephone visit: patient, nurse.   I discussed the limitations, risks, security and privacy concerns of performing an evaluation and management service by telephone and the availability of in person appointments. I also discussed with the patient that there may be a patient responsible charge related to this service. The patient expressed understanding and verbally consented to this telephonic visit.        Cardiac Risk Factors include: advanced age (>81men, >92 women);hypertension     Objective:    Today's Vitals   There is no height or weight on file to calculate BMI.  Advanced Directives 09/21/2020 09/20/2019 09/08/2018 06/23/2017 06/20/2016 05/30/2013 05/24/2013  Does Patient Have a Medical Advance Directive? Yes Yes Yes Yes Yes Patient has advance directive, copy in chart Patient has advance directive, copy in chart  Type of Advance Directive [place];Living will [place];Living will Living will Living will [place];Living will [place];Living will [place];Living will  Does patient want to make changes to medical advance directive? - - - - No - Patient declined - -  Copy of [place] in Chart? No - copy requested No - copy requested - - No - copy requested - -  Pre-existing out of facility DNR order (yellow form or pink MOST form) - - - - - No No    Current Medications (verified) Outpatient Encounter Medications as of 09/21/2020  Medication Sig  . aspirin 81 MG tablet Take 81 mg by mouth daily.  . betamethasone  dipropionate (DIPROLENE) 0.05 % cream Apply topically 2 (two) times daily.  . colchicine 0.6 MG tablet 1 tab po x 1 then repeat in 2 hours, continue daily until flare resolved.  . dicyclomine (BENTYL) 10 MG capsule Take 10 mg by mouth 3 (three) times daily as needed (abdomen pain).  . fish oil-omega-3 fatty acids 1000 MG capsule Take 2 g by mouth 2 (two) times daily.   . Glucosamine Sulfate 500 MG CAPS Take 500 mg by mouth 2 (two) times daily.   . hydroxypropyl methylcellulose (ISOPTO TEARS) 2.5 % ophthalmic solution Place 1 drop into both eyes 3 (three) times daily as needed (dry eyes).  Marland Kitchen lisinopril-hydrochlorothiazide (ZESTORETIC) 20-12.5 MG tablet TAKE 2 TABLETS BY MOUTH EVERY DAY  . meloxicam (MOBIC) 7.5 MG tablet Take 1 tablet (7.5 mg total) by mouth daily as needed for pain.  . metoprolol tartrate (LOPRESSOR) 25 MG tablet TAKE 1 TABLET TWICE DAILY (TAKE WITH 50MG  TABLET FOR A TOTAL OF 75MG  TWICE DAILY)  . metoprolol tartrate (LOPRESSOR) 50 MG tablet TAKE 1 TABLET TWICE DAILY (TAKE WITH 25MG  TABLET FOR A TOTAL OF 75MG  TWICE DAILY)  . Multiple Vitamin (MULTIVITAMIN) tablet Take 1 tablet by mouth daily.    . mupirocin ointment (BACTROBAN) 2 %   . omeprazole (PRILOSEC) 40 MG capsule TAKE 1 CAPSULE EVERY DAY  . OVER THE COUNTER MEDICATION I caps 1 tab bid  . sertraline (ZOLOFT) 50 MG tablet TAKE 1 TABLET BY MOUTH EVERYDAY AT BEDTIME  . triamcinolone cream (KENALOG) 0.5 % Apply 1 application topically 2 (two) times daily. (Patient taking differently:  Apply 1 application topically as needed. )   No facility-administered encounter medications on file as of 09/21/2020.    Allergies (verified) Patient has no known allergies.   History: Past Medical History:  Diagnosis Date  . Erosive esophagitis   . Gastric ulcer   . Gastritis   . GERD (gastroesophageal reflux disease)   . HLD (hyperlipidemia)   . HTN (hypertension)   . Osteoarthritis   . Osteopenia   . Prediabetes    pt does not check  cbg at home  . Squamous cell carcinoma of skin 09/21/2019   Mid vertex scalp  Squamous Cell Carcinoma in Situ   Past Surgical History:  Procedure Laterality Date  . APPENDECTOMY  yrs ago  . arthroscopic knee surgery Left yrs ago  . benign nodule removed from neck  05-21-2013  . EYE SURGERY Bilateral 9- 9-19-2011and 07-2010   cataract lens replacement  . KNEE SURGERY Left 05/2009   to cut nerves  . left knee replacement  09-30-2005  . nuclear cardiolyte  10/08  . right knee replacement Right 08-10-2013  . right shoulder surgery  2005  . TOTAL KNEE REVISION Left 05/30/2013   Procedure: LEFT TOTAL KNEE REVISION;  Surgeon: Mauri Pole, MD;  Location: [facility];  Service: Orthopedics;  Laterality: Left;   Family History  Problem Relation Age of Onset  . Coronary artery disease Father 94  . Hypertension Sister        x 4  . Breast cancer Sister 28  . Breast cancer Daughter 53   Social History   Socioeconomic History  . Marital status: Widowed    Spouse name: Not on file  . Number of children: Not on file  . Years of education: Not on file  . Highest education level: Not on file  Occupational History  . Not on file  Tobacco Use  . Smoking status: Never Smoker  . Smokeless tobacco: Never Used  Vaping Use  . Vaping Use: Never used  Substance and Sexual Activity  . Alcohol use: No    Alcohol/week: 0.0 standard drinks  . Drug use: No  . Sexual activity: Not Currently  Other Topics Concern  . Not on file  Social History Narrative   Full code, has living will, HCPOA children (reviewed 05/2014)   Social Determinants of Health   Financial Resource Strain: Low Risk   . Difficulty of Paying Living Expenses: Not hard at all  Food Insecurity: No Food Insecurity  . Worried About Charity fundraiser in the Last Year: Never true  . Ran Out of Food in the Last Year: Never true  Transportation Needs: No Transportation Needs  . Lack of Transportation (Medical): No  . Lack of  Transportation (Non-Medical): No  Physical Activity: Inactive  . Days of Exercise per Week: 0 days  . Minutes of Exercise per Session: 0 min  Stress: No Stress Concern Present  . Feeling of Stress : Not at all  Social Connections:   . Frequency of Communication with Friends and Family: Not on file  . Frequency of Social Gatherings with Friends and Family: Not on file  . Attends Religious Services: Not on file  . Active Member of Clubs or Organizations: Not on file  . Attends Archivist Meetings: Not on file  . Marital Status: Not on file    Tobacco Counseling Counseling given: Not Answered   Clinical Intake:  Pre-visit preparation completed: Yes  Pain : No/denies pain  Nutritional Risks: None Diabetes: No  How often do you need to have someone help you when you read instructions, pamphlets, or other written materials from your doctor or pharmacy?: 1 - Never What is the last grade level you completed in school?: 12th  Diabetic: No Nutrition Risk Assessment:  Has the patient had any N/V/D within the last 2 months?  No  Does the patient have any non-healing wounds?  No  Has the patient had any unintentional weight loss or weight gain?  No   Diabetes:  Is the patient diabetic?  N/A, No If diabetic, was a CBG obtained today?  N/A Did the patient bring in their glucometer from home?  N/A How often do you monitor your CBG's? N/A.   Financial Strains and Diabetes Management:  Are you having any financial strains with the device, your supplies or your medication? N/A.  Does the patient want to be seen by Chronic Care Management for management of their diabetes?  N/A Would the patient like to be referred to a Nutritionist or for Diabetic Management?  N/A    Interpreter Needed?: No  Information entered by :: CJohnson, LPN   Activities of Daily Living In your present state of health, do you have any difficulty performing the following activities:  09/21/2020  Hearing? Y  Comment wear hearing aids  Vision? Y  Comment has macular degeneration  Difficulty concentrating or making decisions? N  Walking or climbing stairs? N  Dressing or bathing? N  Doing errands, shopping? Y  Comment does not drive due to macular degeneration  Preparing Food and eating ? N  Using the Toilet? N  In the past six months, have you accidently leaked urine? Y  Comment wears pads  Do you have problems with loss of bowel control? N  Managing your Medications? N  Managing your Finances? N  Housekeeping or managing your Housekeeping? N  Some recent data might be hidden    Patient Care Team: Jinny Sanders, MD as PCP - General  Indicate any recent Medical Services you may have received from other than [facility] providers in the past year (date may be approximate).     Assessment:   This is a routine wellness examination for [facility].  Hearing/Vision screen  Hearing Screening   125Hz  250Hz  500Hz  1000Hz  2000Hz  3000Hz  4000Hz  6000Hz  8000Hz   Right ear:           Left ear:           Vision Screening Comments: Advised patient to get annual eye exams.   Dietary issues and exercise activities discussed: Current Exercise Habits: The patient does not participate in regular exercise at present, Exercise limited by: None identified  Goals    . Increase water intake     Starting 09/08/2018, I will attempt to drink 6-8 glasses of water daily.     . Patient Stated     09/20/2019, I will maintain and continue medications as prescribed.     . Patient Stated     09/21/2020, I will maintain and continue medications as prescribed.       Depression Screen PHQ 2/9 Scores 09/21/2020 09/20/2019 09/08/2018 06/23/2017 04/03/2017 04/03/2017 06/20/2016  PHQ - 2 Score 0 2 0 1 3 2 1   PHQ- 9 Score 0 2 0 1 8 3  -    Fall Risk Fall Risk  09/21/2020 09/20/2019 09/08/2018 09/08/2018 06/23/2017  Falls in the past year? 0 0 0 0 No  Comment - - - Emmi Telephone Survey:  data to providers prior  to load -  Number falls in past yr: 0 0 - - -  Injury with Fall? 0 0 - - -  Risk for fall due to : Impaired vision;Medication side effect Medication side effect - - -  Follow up Falls prevention discussed;Falls evaluation completed Falls evaluation completed;Falls prevention discussed - - -    Any stairs in or around the home? Yes  If so, are there any without handrails? No  Home free of loose throw rugs in walkways, pet beds, electrical cords, etc? Yes  Adequate lighting in your home to reduce risk of falls? Yes   ASSISTIVE DEVICES UTILIZED TO PREVENT FALLS:  Life alert? No  Use of a cane, walker or w/c? No  Grab bars in the bathroom? No  Shower chair or bench in shower? No  Elevated toilet seat or a handicapped toilet? No   TIMED UP AND GO:  Was the test performed? N/A, telephone visit.    Cognitive Function: MMSE - Mini Mental State Exam 09/21/2020 09/20/2019 09/08/2018 06/23/2017  Orientation to time 5 5 5 5   Orientation to Place 5 5 5 5   Registration 3 3 3 3   Attention/ Calculation 5 5 0 0  Recall 3 3 3 3   Language- name 2 objects - - 0 0  Language- repeat 1 1 1 1   Language- follow 3 step command - - 3 3  Language- read & follow direction - - 0 0  Write a sentence - - 0 0  Copy design - - 0 0  Total score - - 20 20  Mini Cog  Mini-Cog screen was completed. Maximum score is 22. A value of 0 denotes this part of the MMSE was not completed or the patient failed this part of the Mini-Cog screening.       Immunizations Immunization History  Administered Date(s) Administered  . Fluad Quad(high Dose 65+) 07/21/2019, 08/23/2020  . H1N1 11/06/2008  . Influenza Split 07/22/2012  . Influenza Whole 07/21/2007, 07/20/2008, 08/20/2010  . Influenza,inj,Quad PF,6+ Mos 06/26/2017, 09/08/2018  . Influenza-Unspecified 07/20/2016  . PFIZER SARS-COV-2 Vaccination 11/09/2019, 11/30/2019, 08/05/2020  . Pneumococcal Conjugate-13 06/13/2014  . Pneumococcal Polysaccharide-23  10/20/1998  . Td 10/20/1998, 11/06/2008  . Zoster 10/20/2008    TDAP status: Due, Education has been provided regarding the importance of this vaccine. Advised may receive this vaccine at local pharmacy or Health Dept. Aware to provide a copy of the vaccination record if obtained from local pharmacy or Health Dept. Verbalized acceptance and understanding. Flu Vaccine status: Up to date Pneumococcal vaccine status: Up to date Covid-19 vaccine status: Completed vaccines  Qualifies for Shingles Vaccine? Yes   Zostavax completed Yes   Shingrix Completed?: No.    Education has been provided regarding the importance of this vaccine. Patient has been advised to call insurance company to determine out of pocket expense if they have not yet received this vaccine. Advised may also receive vaccine at local pharmacy or Health Dept. Verbalized acceptance and understanding.  Screening Tests Health Maintenance  Topic Date Due  . TETANUS/TDAP  09/21/2024 (Originally 11/06/2018)  . INFLUENZA VACCINE  Completed  . DEXA SCAN  Completed  . COVID-19 Vaccine  Completed  . PNA vac Low Risk Adult  Completed    Health Maintenance  There are no preventive care reminders to display for this patient.  Colorectal cancer screening: No longer required.  Mammogram status: Completed 01/13/2020. Repeat every year Bone Density status: due, will discuss with provider  at upcoming physical   Lung Cancer Screening: (Low Dose CT Chest recommended if Age 86-80 years, 30 pack-year currently smoking OR have quit w/in 15 years.) does not qualify.   Additional Screening:  Hepatitis C Screening: does not qualify; Completed N/A  Vision Screening: Recommended annual ophthalmology exams for early detection of glaucoma and other disorders of the eye. Is the patient up to date with their annual eye exam?  No  Who is the provider or what is the name of the office in which the patient attends annual eye exams? Dr. Tye Savoy If pt  is not established with a provider, would they like to be referred to a provider to establish care? No .   Dental Screening: Recommended annual dental exams for proper oral hygiene  Community Resource Referral / Chronic Care Management: CRR required this visit?  No   CCM required this visit?  No      Plan:     I have personally reviewed and noted the following in the patient's chart:   . Medical and social history . Use of alcohol, tobacco or illicit drugs  . Current medications and supplements . Functional ability and status . Nutritional status . Physical activity . Advanced directives . List of other physicians . Hospitalizations, surgeries, and ER visits in previous 12 months . Vitals . Screenings to include cognitive, depression, and falls . Referrals and appointments  In addition, I have reviewed and discussed with patient certain preventive protocols, quality metrics, and best practice recommendations. A written personalized care plan for preventive services as well as general preventive health recommendations were provided to patient.   Due to this being a telephonic visit, the after visit summary with patients personalized plan was offered to patient via office or my-chart. Patient preferred to pick up at office at next visit or via mychart.  Andrez Grime, LPN   [id number]

## 2020-09-21 NOTE — Progress Notes (Signed)
No critical labs need to be addressed urgently. We will discuss labs in detail at upcoming office visit.   

## 2020-09-25 ENCOUNTER — Other Ambulatory Visit: Payer: Self-pay

## 2020-09-25 ENCOUNTER — Ambulatory Visit (INDEPENDENT_AMBULATORY_CARE_PROVIDER_SITE_OTHER): Payer: Medicare Other | Admitting: Family Medicine

## 2020-09-25 ENCOUNTER — Encounter: Payer: Self-pay | Admitting: Family Medicine

## 2020-09-25 DIAGNOSIS — F320 Major depressive disorder, single episode, mild: Secondary | ICD-10-CM

## 2020-09-25 DIAGNOSIS — I10 Essential (primary) hypertension: Secondary | ICD-10-CM | POA: Diagnosis not present

## 2020-09-25 DIAGNOSIS — N1831 Chronic kidney disease, stage 3a: Secondary | ICD-10-CM

## 2020-09-25 DIAGNOSIS — K2210 Ulcer of esophagus without bleeding: Secondary | ICD-10-CM

## 2020-09-25 MED ORDER — TRAMADOL HCL 50 MG PO TABS: 25.0000 mg | ORAL_TABLET | Freq: Two times a day (BID) | ORAL | 0 refills | Status: AC | PRN

## 2020-09-25 NOTE — Assessment & Plan Note (Signed)
Well controlled. Continue current medication.  

## 2020-09-25 NOTE — Progress Notes (Signed)
Chief Complaint  Patient presents with  . Annual Exam    Part 2    History of Present Illness: HPI  The patient presents for  review of chronic health problems. He/She also has the following acute concerns today: SOB is gradually worsening over time.  No chest pain. No cough or congestion.    Knees and low back chronically hurting.Marland Kitchen tylenol helps sometimes but requests a med to treat breakthrough pain.  The patient saw a LPN or RN for medicare wellness visit.  Prevention and wellness was reviewed in detail. Note reviewed and important notes copied below. Health Maintenance: Dexa- due Abnormal Screenings: none  09/25/20   Abnormal CT with possible lung cancer as cause of DOE... does not wish to pursue. With ADLs takes breaks a lot.  MDD/GAD  Stable control on sertraline 50 mg daily   Clinical Support from 09/21/2020 in [place] at [facility] Total Score 0      Hypertension:  At goal on lisinopril HCTZ 20/12.5 mg TWO tabs daily, metoprolol 50/25 prn  BP Readings from Last 3 Encounters:  09/25/20 140/70  12/30/19 (!) 144/64  09/30/19 (!) 180/70  Using medication without problems or lightheadedness: none Chest pain with exertion: none Edema: none Short of breath: worsening Average home BPs: Other issues:  PUD,erosive esophagitis  [place] control  On omeprazole 40 mg daily  Gout:   occ flares, not very bothersome. Use colchine prn. Uric acid elevated with recent testing 8.7   Vit D: 42 on recent test with supplement.   Derm Dr. Laurence Ferrari.. sees several times a year. ENT DR. Newman Last saw Pt for sensorineural hearing loss and cerumen impaction on 03/21/20, note reviewed. Has hearing aides.    This visit occurred during the SARS-CoV-2 public health emergency.  Safety protocols were in place, including screening questions prior to the visit, additional usage of staff PPE, and extensive cleaning of exam room while observing appropriate contact time as  indicated for disinfecting solutions.   COVID 19 screen:  No recent travel or known exposure to COVID19 The patient denies respiratory symptoms of COVID 19 at this time. The importance of social distancing was discussed today.     Review of Systems  Constitutional: Negative for chills and fever.  HENT: Negative for congestion and ear pain.   Eyes: Negative for pain and redness.  Respiratory: Negative for cough and shortness of breath.   Cardiovascular: Negative for chest pain, palpitations and leg swelling.  Gastrointestinal: Negative for abdominal pain, blood in stool, constipation, diarrhea, nausea and vomiting.  Genitourinary: Negative for dysuria.  Musculoskeletal: Negative for falls and myalgias.  Skin: Negative for rash.  Neurological: Negative for dizziness.  Psychiatric/Behavioral: Negative for depression. The patient is not nervous/anxious.       Past Medical History:  Diagnosis Date  . Erosive esophagitis   . Gastric ulcer   . Gastritis   . GERD (gastroesophageal reflux disease)   . HLD (hyperlipidemia)   . HTN (hypertension)   . Osteoarthritis   . Osteopenia   . Prediabetes    pt does not check cbg at home  . Squamous cell carcinoma of skin 09/21/2019   Mid vertex scalp  Squamous Cell Carcinoma in Situ    reports that she has never smoked. She has never used smokeless tobacco. She reports that she does not drink alcohol and does not use drugs.   Current Outpatient Medications:  .  aspirin 81 MG tablet, Take 81 mg by mouth daily.,  Disp: , Rfl:  .  colchicine 0.6 MG tablet, 1 tab po x 1 then repeat in 2 hours, continue daily until flare resolved., Disp: 30 tablet, Rfl: 1 .  fish oil-omega-3 fatty acids 1000 MG capsule, Take 2 g by mouth 2 (two) times daily. , Disp: , Rfl:  .  Glucosamine Sulfate 500 MG CAPS, Take 500 mg by mouth 2 (two) times daily. , Disp: , Rfl:  .  hydroxypropyl methylcellulose (ISOPTO TEARS) 2.5 % ophthalmic solution, Place 1 drop into both  eyes 3 (three) times daily as needed (dry eyes)., Disp: , Rfl:  .  lisinopril-hydrochlorothiazide (ZESTORETIC) 20-12.5 MG tablet, TAKE 2 TABLETS BY MOUTH EVERY DAY, Disp: 180 tablet, Rfl: 0 .  metoprolol tartrate (LOPRESSOR) 25 MG tablet, TAKE 1 TABLET TWICE DAILY (TAKE WITH 50MG  TABLET FOR A TOTAL OF 75MG  TWICE DAILY), Disp: 180 tablet, Rfl: 1 .  metoprolol tartrate (LOPRESSOR) 50 MG tablet, TAKE 1 TABLET TWICE DAILY (TAKE WITH 25MG  TABLET FOR A TOTAL OF 75MG  TWICE DAILY), Disp: 180 tablet, Rfl: 1 .  Multiple Vitamin (MULTIVITAMIN) tablet, Take 1 tablet by mouth daily.  , Disp: , Rfl:  .  omeprazole (PRILOSEC) 40 MG capsule, TAKE 1 CAPSULE EVERY DAY, Disp: 90 capsule, Rfl: 1 .  OVER THE COUNTER MEDICATION, I caps 1 tab bid, Disp: , Rfl:  .  sertraline (ZOLOFT) 50 MG tablet, TAKE 1 TABLET BY MOUTH EVERYDAY AT BEDTIME, Disp: 90 tablet, Rfl: 0   Observations/Objective: Blood pressure 140/70, pulse 66, temperature (!) 97 F (36.1 C), temperature source Temporal, height 5' 3.25" (1.607 m), weight 162 lb 8 oz (73.7 kg), SpO2 99 %.  Physical Exam   Assessment and Plan   The patient's preventative maintenance and recommended screening tests for an annual wellness exam were reviewed in full today. Brought up to date unless services declined.  Counselled on the importance of diet, exercise, and its role in overall health and mortality. The patient's FH and SH was reviewed, including their home life, tobacco status, and drug and alcohol status.    Vaccines: Uptodate with vaccines PNA, prevnar, zostavax,flu, COVID x 3.. except td.. refused Colon: incomplete colonoscopy 2009.Marland Kitchen Not indicated given age. Pap/DVE not indicated given age 57: likes to continue, sister and 2 daughters with breast cancer. Last nml 12/2019, will plan every 2 years. Non smoker  DEXA:  Stable osteopenia 2019: plan repeat in 5 years.   HYPERTENSION, BENIGN Well controlled. Continue current medication.   Erosive  esophagitis Continue PPI longterm.  CKD (chronic kidney disease) stage 3, GFR 30-59 ml/min (HCC) Stable control  On recent testing.  Depression, major, single episode, mild ([place]) Well controlled. Continue current medication.     Eliezer Lofts, MD

## 2020-09-25 NOTE — Patient Instructions (Addendum)
Work on low purine low uric acid diet as below. Work on Designer, fashion/clothing and decreasing caffiene   [place] A low-purine eating plan involves making food choices to limit your intake of purine. Purine is a kind of uric acid. Too much uric acid in your blood can cause certain conditions, such as gout and kidney stones. Eating a low-purine diet can help control these conditions. What are tips for following this plan? Reading food labels   Avoid foods with saturated or Trans fat.  Check the ingredient list of grains-based foods, such as bread and cereal, to make sure that they contain whole grains.  Check the ingredient list of sauces or soups to make sure they do not contain meat or fish.  When choosing soft drinks, check the ingredient list to make sure they do not contain high-fructose corn syrup. Shopping  Buy plenty of fresh fruits and vegetables.  Avoid buying canned or fresh fish.  Buy dairy products labeled as low-fat or nonfat.  Avoid buying premade or processed foods. These foods are often high in fat, salt (sodium), and added sugar. Cooking  Use olive oil instead of butter when cooking. Oils like olive oil, canola oil, and sunflower oil contain healthy fats. Meal planning  Learn which foods do or do not affect you. If you find out that a food tends to cause your gout symptoms to flare up, avoid eating that food. You can enjoy foods that do not cause problems. If you have any questions about a food item, talk with your dietitian or health care provider.  Limit foods high in fat, especially saturated fat. Fat makes it harder for your body to get rid of uric acid.  Choose foods that are lower in fat and are lean sources of protein. General guidelines  Limit alcohol intake to no more than 1 drink a day for nonpregnant women and 2 drinks a day for men. One drink equals 12 oz of beer, 5 oz of wine, or 1 oz of hard liquor. Alcohol can affect the way your body  gets rid of uric acid.  Drink plenty of water to keep your urine clear or pale yellow. Fluids can help remove uric acid from your body.  If directed by your health care provider, take a vitamin C supplement.  Work with your health care provider and dietitian to develop a plan to achieve or maintain a healthy weight. Losing weight can help reduce uric acid in your blood. What foods are recommended? The items listed may not be a complete list. Talk with your dietitian about what dietary choices are best for you. Foods low in purines Foods low in purines do not need to be limited. These include:  All fruits.  All low-purine vegetables, pickles, and olives.  Breads, pasta, rice, cornbread, and popcorn. Cake and other baked goods.  All dairy foods.  Eggs, nuts, and nut butters.  Spices and condiments, such as salt, herbs, and vinegar.  Plant oils, butter, and margarine.  Water, sugar-free soft drinks, tea, coffee, and cocoa.  Vegetable-based soups, broths, sauces, and gravies. Foods moderate in purines Foods moderate in purines should be limited to the amounts listed.   cup of asparagus, cauliflower, spinach, mushrooms, or green peas, each day.  2/3 cup uncooked oatmeal, each day.   cup dry wheat bran or wheat germ, each day.  2-3 ounces of meat or poultry, each day.  4-6 ounces of shellfish, such as crab, lobster, oysters, or shrimp, each day.  1 cup cooked beans, peas, or lentils, each day.  Soup, broths, or bouillon made from meat or fish. Limit these foods as much as possible. What foods are not recommended? The items listed may not be a complete list. Talk with your dietitian about what dietary choices are best for you. Limit your intake of foods high in purines, including:  Beer and other alcohol.  Meat-based gravy or sauce.  Canned or fresh fish, such as: ? Anchovies, sardines, herring, and tuna. ? Mussels and scallops. ? Codfish, trout, and  haddock.  Berniece Salines.  Organ meats, such as: ? Liver or kidney. ? Tripe. ? Sweetbreads (thymus gland or pancreas).  Wild Clinical biochemist.  Yeast or yeast extract supplements.  Drinks sweetened with high-fructose corn syrup. Summary  Eating a low-purine diet can help control conditions caused by too much uric acid in the body, such as gout or kidney stones.  Choose low-purine foods, limit alcohol, and limit foods high in fat.  You will learn over time which foods do or do not affect you. If you find out that a food tends to cause your gout symptoms to flare up, avoid eating that food. This information is not intended to replace advice given to you by your health care provider. Make sure you discuss any questions you have with your health care provider. Document Revised: 09/18/2017 Document Reviewed: 11/19/2016 Elsevier Patient Education  2020 [employer].

## 2020-09-25 NOTE — Assessment & Plan Note (Signed)
Continue PPI longterm.

## 2020-09-25 NOTE — Assessment & Plan Note (Signed)
Stable control  On recent testing.

## 2020-09-25 NOTE — Progress Notes (Signed)
SpO2 at rest on room air:  98% SpO2 with ambulation on room air: 97%

## 2020-09-28 DIAGNOSIS — D044 Carcinoma in situ of skin of scalp and neck: Secondary | ICD-10-CM | POA: Diagnosis not present

## 2020-10-24 ENCOUNTER — Other Ambulatory Visit: Payer: Self-pay | Admitting: *Deleted

## 2020-10-24 MED ORDER — METOPROLOL TARTRATE 25 MG PO TABS: ORAL_TABLET | ORAL | 1 refills | Status: DC

## 2020-10-24 MED ORDER — SERTRALINE HCL 50 MG PO TABS: ORAL_TABLET | ORAL | 1 refills | Status: DC

## 2020-10-24 MED ORDER — METOPROLOL TARTRATE 50 MG PO TABS: ORAL_TABLET | ORAL | 1 refills | Status: DC

## 2020-10-24 MED ORDER — LISINOPRIL-HYDROCHLOROTHIAZIDE 20-12.5 MG PO TABS: 2.0000 | ORAL_TABLET | Freq: Every day | ORAL | 1 refills | Status: DC

## 2020-10-24 MED ORDER — OMEPRAZOLE 40 MG PO CPDR: 40.0000 mg | DELAYED_RELEASE_CAPSULE | Freq: Every day | ORAL | 1 refills | Status: DC

## 2020-10-30 ENCOUNTER — Encounter: Payer: Self-pay | Admitting: Dermatology

## 2020-10-30 ENCOUNTER — Ambulatory Visit: Payer: Medicare Other | Admitting: Dermatology

## 2020-10-30 ENCOUNTER — Other Ambulatory Visit: Payer: Self-pay

## 2020-10-30 DIAGNOSIS — Z85828 Personal history of other malignant neoplasm of skin: Secondary | ICD-10-CM | POA: Diagnosis not present

## 2020-10-30 DIAGNOSIS — L821 Other seborrheic keratosis: Secondary | ICD-10-CM

## 2020-10-30 DIAGNOSIS — L814 Other melanin hyperpigmentation: Secondary | ICD-10-CM | POA: Diagnosis not present

## 2020-10-30 DIAGNOSIS — L578 Other skin changes due to chronic exposure to nonionizing radiation: Secondary | ICD-10-CM

## 2020-10-30 DIAGNOSIS — L570 Actinic keratosis: Secondary | ICD-10-CM | POA: Diagnosis not present

## 2020-10-30 NOTE — Progress Notes (Signed)
   Follow-Up Visit   Subjective  [name] is a 87 y.o. female who presents for the following: Actinic Keratosis (Recheck previously tx areas - R dorsal hand, R forearm, R med thigh, nose, and the R post base of neck ) and Recheck SCC  (Mid vertex scalp - S/P MOHS, recheck today for any signs of recurrence).   The following portions of the chart were reviewed this encounter and updated as appropriate:   Tobacco  Allergies  Meds  Problems  Med Hx  Surg Hx  Fam Hx     Review of Systems:  No other skin or systemic complaints except as noted in HPI or Assessment and Plan.  Objective  Well appearing patient in no apparent distress; mood and affect are within normal limits.  A focused examination was performed including the face, scalp, arms, and hands. Relevant physical exam findings are noted in the Assessment and Plan.  Objective  Frontal scalp, nose, L upper cutaneous lip, L cheek, R forearm, L zygoma, R med thigh (7): Erythematous thin papules/macules with gritty scale.   Assessment & Plan  History of SCC (squamous cell carcinoma) of skin Mid vertex scalp  Clear. Observe for recurrence. Call clinic for new or changing lesions.  Recommend regular skin exams, daily broad-spectrum spf 30+ sunscreen use, and photoprotection.     AK (actinic keratosis) (7) Frontal scalp, nose, L upper cutaneous lip, L cheek, R forearm, L zygoma, R med thigh  Prior to procedure, discussed risks of blister formation, small wound, skin dyspigmentation, or rare scar following cryotherapy.    Destruction of lesion - Frontal scalp, nose, L upper cutaneous lip, L cheek, R forearm, L zygoma, R med thigh Complexity: simple   Destruction method: cryotherapy   Informed consent: discussed and consent obtained   Timeout:  patient name, date of birth, surgical site, and procedure verified Lesion destroyed using liquid nitrogen: Yes   Region frozen until ice ball extended beyond lesion: Yes    Outcome: patient tolerated procedure well with no complications   Post-procedure details: wound care instructions given     Actinic Damage - chronic, secondary to cumulative UV radiation exposure/sun exposure over time - diffuse scaly erythematous macules with underlying dyspigmentation - Recommend daily broad spectrum sunscreen SPF 30+ to sun-exposed areas, reapply every 2 hours as needed.  - Call for new or changing lesions.  Lentigines - Scattered tan macules - Discussed due to sun exposure - Benign, observe - Recommend daily broad spectrum sunscreen SPF 30+ to sun-exposed areas, reapply every 2 hours as needed. - Call for any changes  Seborrheic Keratoses - Stuck-on, waxy, tan-brown papules and plaques  - Discussed benign etiology and prognosis. - Observe - Call for any changes  Return in about 4 months (around 02/27/2021) for TBSE.  Luther Redo, CMA, am acting as scribe for Forest Gleason, MD .  Documentation: I have reviewed the above documentation for accuracy and completeness, and I agree with the above.  Forest Gleason, MD

## 2020-11-20 ENCOUNTER — Other Ambulatory Visit: Payer: Self-pay | Admitting: Family Medicine

## 2021-01-25 ENCOUNTER — Encounter: Payer: Self-pay | Admitting: Family Medicine

## 2021-01-25 ENCOUNTER — Ambulatory Visit (INDEPENDENT_AMBULATORY_CARE_PROVIDER_SITE_OTHER): Payer: Medicare Other | Admitting: Family Medicine

## 2021-01-25 ENCOUNTER — Other Ambulatory Visit: Payer: Self-pay

## 2021-01-25 VITALS — BP 130/80 | HR 61 | Temp 98.5°F | Ht 63.25 in | Wt 163.5 lb

## 2021-01-25 DIAGNOSIS — I10 Essential (primary) hypertension: Secondary | ICD-10-CM

## 2021-02-04 DIAGNOSIS — H0012 Chalazion right lower eyelid: Secondary | ICD-10-CM | POA: Diagnosis not present

## 2021-02-13 ENCOUNTER — Ambulatory Visit (INDEPENDENT_AMBULATORY_CARE_PROVIDER_SITE_OTHER): Payer: Medicare Other | Admitting: Otolaryngology

## 2021-02-21 ENCOUNTER — Encounter: Payer: Medicare Other | Admitting: Dermatology

## 2021-02-27 ENCOUNTER — Other Ambulatory Visit: Payer: Self-pay | Admitting: Family Medicine

## 2021-02-27 DIAGNOSIS — Z1231 Encounter for screening mammogram for malignant neoplasm of breast: Secondary | ICD-10-CM

## 2021-02-27 NOTE — Progress Notes (Signed)
error 

## 2021-03-17 ENCOUNTER — Other Ambulatory Visit: Payer: Self-pay | Admitting: Family Medicine

## 2021-03-20 NOTE — Telephone Encounter (Signed)
Pharmacy requests refill on: Lisinopril-HCTZ 20-12.5 mg   LAST REFILL: 10/24/2020 (Q-180, R-1) LAST OV: 01/25/2021 NEXT OV: 03/26/2021 PHARMACY: [id number] [place], [place]

## 2021-03-26 ENCOUNTER — Other Ambulatory Visit: Payer: Self-pay

## 2021-03-26 ENCOUNTER — Ambulatory Visit (INDEPENDENT_AMBULATORY_CARE_PROVIDER_SITE_OTHER): Payer: Medicare Other | Admitting: Family Medicine

## 2021-03-26 ENCOUNTER — Encounter: Payer: Self-pay | Admitting: Family Medicine

## 2021-03-26 VITALS — BP 160/72 | HR 60 | Temp 98.3°F | Ht 63.25 in | Wt 163.5 lb

## 2021-03-26 DIAGNOSIS — I10 Essential (primary) hypertension: Secondary | ICD-10-CM | POA: Diagnosis not present

## 2021-03-26 DIAGNOSIS — R0600 Dyspnea, unspecified: Secondary | ICD-10-CM

## 2021-03-26 DIAGNOSIS — I700 Atherosclerosis of aorta: Secondary | ICD-10-CM | POA: Diagnosis not present

## 2021-03-26 DIAGNOSIS — F320 Major depressive disorder, single episode, mild: Secondary | ICD-10-CM | POA: Diagnosis not present

## 2021-03-26 DIAGNOSIS — R0609 Other forms of dyspnea: Secondary | ICD-10-CM

## 2021-03-26 LAB — CBC WITH DIFFERENTIAL/PLATELET
Basophils Absolute: 0.1 10*3/uL (ref 0.0–0.1)
Basophils Relative: 0.8 % (ref 0.0–3.0)
Eosinophils Absolute: 0.2 10*3/uL (ref 0.0–0.7)
Eosinophils Relative: 2.6 % (ref 0.0–5.0)
HCT: 39.9 % (ref 36.0–46.0)
Hemoglobin: 13.3 g/dL (ref 12.0–15.0)
Lymphocytes Relative: 29.1 % (ref 12.0–46.0)
Lymphs Abs: 2.5 10*3/uL (ref 0.7–4.0)
MCHC: 33.3 g/dL (ref 30.0–36.0)
MCV: 91.6 fl (ref 78.0–100.0)
Monocytes Absolute: 0.8 10*3/uL (ref 0.1–1.0)
Monocytes Relative: 9.2 % (ref 3.0–12.0)
Neutro Abs: 5.1 10*3/uL (ref 1.4–7.7)
Neutrophils Relative %: 58.3 % (ref 43.0–77.0)
Platelets: 227 10*3/uL (ref 150.0–400.0)
RBC: 4.35 Mil/uL (ref 3.87–5.11)
RDW: 13.6 % (ref 11.5–15.5)
WBC: 8.7 10*3/uL (ref 4.0–10.5)

## 2021-03-26 LAB — TSH: TSH: 3.43 u[IU]/mL (ref 0.35–4.50)

## 2021-03-26 NOTE — Assessment & Plan Note (Addendum)
Above goal in office today.. follow at home... continue metoprolol and lisinopril HCTZ.

## 2021-03-26 NOTE — Progress Notes (Signed)
Patient ID: [name], female    DOB: [birth year], 87 y.o.   MRN: [id number]  This visit was conducted in person.  BP (!) 160/72   Pulse 60   Temp 98.3 F (36.8 C) (Temporal)   Ht 5' 3.25" (1.607 m)   Wt 163 lb 8 oz (74.2 kg)   SpO2 96%   BMI 28.73 kg/m    CC:  Chief Complaint  Patient presents with  . Follow-up    HTN    Subjective:   HPI: [name] is a 87 y.o. female presenting on 03/26/2021 for Follow-up (HTN) 6 MONTH FOLLOW UP  Hypertension:   Above goal in office today despite lisinopril HCTZ and metoprolol  She has been very stressed out lately with home remodeling of kitchen.. almost done but taking almost a month  Using medication without problems or lightheadedness:  Chest pain with exertion: none Edema: none Short of breath: gradually worsening Average home BPs: not checking recently. Other issues:   MDD/GAD  Stable control on sertraline 50 mg daily.. has had some more anxiety.. but stiutional with remodeling kitchen.      Continue SOB.Marland Kitchen no wheeze cough... with exertion... mild progression over the years.  No chest pain, no chest tightness. 2010 CT chest   nodular/scarring appearance right lung apex 11/2010:  no change on CT scan in last year. Consistent with scarring. No further eval needed.   Labs ECHO and EKG unremarkable in 2018  Relevant past medical, surgical, family and social history reviewed and updated as indicated. Interim medical history since our last visit reviewed. Allergies and medications reviewed and updated. Outpatient Medications Prior to Visit  Medication Sig Dispense Refill  . aspirin 81 MG tablet Take 81 mg by mouth daily.    . colchicine 0.6 MG tablet 1 tab po x 1 then repeat in 2 hours, continue daily until flare resolved. 30 tablet 1  . fish oil-omega-3 fatty acids 1000 MG capsule Take 2 g by mouth 2 (two) times daily.     . Glucosamine Sulfate 500 MG CAPS Take 500 mg by mouth 2 (two) times daily.    .  hydroxypropyl methylcellulose (ISOPTO TEARS) 2.5 % ophthalmic solution Place 1 drop into both eyes 3 (three) times daily as needed (dry eyes).    Marland Kitchen lisinopril-hydrochlorothiazide (ZESTORETIC) 20-12.5 MG tablet TAKE 2 TABLETS BY MOUTH EVERY DAY 180 tablet 0  . metoprolol tartrate (LOPRESSOR) 25 MG tablet TAKE 1 TABLET TWICE DAILY (TAKE WITH 50 MG TABLET FOR A TOTAL OF 75 MG TWICE DAILY) 180 tablet 1  . metoprolol tartrate (LOPRESSOR) 50 MG tablet TAKE 1 TABLET TWICE A DAILY (TAKE WITH 25 MG FOR A TOTAL OF 75 MG TWICE DAILY) 180 tablet 1  . Multiple Vitamin (MULTIVITAMIN) tablet Take 1 tablet by mouth daily.    Marland Kitchen neomycin-polymyxin b-dexamethasone (MAXITROL) 3.5-10000-0.1 OINT     . omeprazole (PRILOSEC) 40 MG capsule Take 1 capsule (40 mg total) by mouth daily. 90 capsule 1  . OVER THE COUNTER MEDICATION I caps 1 tab bid    . sertraline (ZOLOFT) 50 MG tablet TAKE 1 TABLET BY MOUTH EVERYDAY AT BEDTIME 90 tablet 1   No facility-administered medications prior to visit.     Per HPI unless specifically indicated in ROS section below Review of Systems  Constitutional: Negative for fatigue and fever.  HENT: Negative for ear pain.   Eyes: Negative for pain.  Respiratory: Positive for shortness of breath. Negative for chest tightness.  Cardiovascular: Negative for chest pain, palpitations and leg swelling.  Gastrointestinal: Negative for abdominal pain.  Genitourinary: Negative for dysuria.   Objective:  BP (!) 160/72   Pulse 60   Temp 98.3 F (36.8 C) (Temporal)   Ht 5' 3.25" (1.607 m)   Wt 163 lb 8 oz (74.2 kg)   SpO2 96%   BMI 28.73 kg/m   Wt Readings from Last 3 Encounters:  03/26/21 163 lb 8 oz (74.2 kg)  01/25/21 163 lb 8 oz (74.2 kg)  09/25/20 162 lb 8 oz (73.7 kg)      Physical Exam Constitutional:      General: She is not in acute distress.    Appearance: Normal appearance. She is well-developed. She is not ill-appearing or toxic-appearing.  HENT:     Head: Normocephalic.      Right Ear: Hearing, tympanic membrane, ear canal and external ear normal. Tympanic membrane is not erythematous, retracted or bulging.     Left Ear: Hearing, tympanic membrane, ear canal and external ear normal. Tympanic membrane is not erythematous, retracted or bulging.     Nose: No mucosal edema or rhinorrhea.     Right Sinus: No maxillary sinus tenderness or frontal sinus tenderness.     Left Sinus: No maxillary sinus tenderness or frontal sinus tenderness.     Mouth/Throat:     Pharynx: Uvula midline.  Eyes:     General: Lids are normal. Lids are everted, no foreign bodies appreciated.     Conjunctiva/sclera: Conjunctivae normal.     Pupils: Pupils are equal, round, and reactive to light.  Neck:     Thyroid: No thyroid mass or thyromegaly.     Vascular: No carotid bruit.     Trachea: Trachea normal.  Cardiovascular:     Rate and Rhythm: Normal rate and regular rhythm.     Pulses: Normal pulses.     Heart sounds: Normal heart sounds, S1 normal and S2 normal. No murmur heard. No friction rub. No gallop.   Pulmonary:     Effort: Pulmonary effort is normal. No tachypnea or respiratory distress.     Breath sounds: Normal breath sounds. No decreased breath sounds, wheezing, rhonchi or rales.  Abdominal:     General: Bowel sounds are normal.     Palpations: Abdomen is soft.     Tenderness: There is no abdominal tenderness.  Musculoskeletal:     Cervical back: Normal range of motion and neck supple.  Skin:    General: Skin is warm and dry.     Findings: No rash.  Neurological:     Mental Status: She is alert.  Psychiatric:        Mood and Affect: Mood is not anxious or depressed.        Speech: Speech normal.        Behavior: Behavior normal. Behavior is cooperative.        Thought Content: Thought content normal.        Judgment: Judgment normal.       Results for orders placed or performed in visit on 09/21/20  VITAMIN D 25 Hydroxy (Vit-D Deficiency, Fractures)   Result Value Ref Range   VITD 42.31 30.00 - 100.00 ng/mL  Uric acid  Result Value Ref Range   Uric Acid, Serum 8.7 (H) 2.4 - 7.0 mg/dL  Comprehensive metabolic panel  Result Value Ref Range   Sodium 139 135 - 145 mEq/L   Potassium 3.8 3.5 - 5.1 mEq/L   Chloride 102 96 -  112 mEq/L   CO2 27 19 - 32 mEq/L   Glucose, Bld 104 (H) 70 - 99 mg/dL   BUN 29 (H) 6 - 23 mg/dL   Creatinine, Ser 1.03 0.40 - 1.20 mg/dL   Total Bilirubin 0.4 0.2 - 1.2 mg/dL   Alkaline Phosphatase 51 39 - 117 U/L   AST 14 0 - 37 U/L   ALT 9 0 - 35 U/L   Total Protein 7.2 6.0 - 8.3 g/dL   Albumin 4.0 3.5 - 5.2 g/dL   GFR 47.05 (L) >60.00 mL/min   Calcium 9.7 8.4 - 10.5 mg/dL  Hemoglobin A1c  Result Value Ref Range   Hgb A1c MFr Bld 6.0 4.6 - 6.5 %    This visit occurred during the SARS-CoV-2 public health emergency.  Safety protocols were in place, including screening questions prior to the visit, additional usage of staff PPE, and extensive cleaning of exam room while observing appropriate contact time as indicated for disinfecting solutions.   COVID 19 screen:  No recent travel or known exposure to COVID19 The patient denies respiratory symptoms of COVID 19 at this time. The importance of social distancing was discussed today.   Assessment and Plan    Problem List Items Addressed This Visit    Aortic atherosclerosis ([place])    Not on statin given age.      Depression, major, single episode, mild (HCC) (Chronic)   Dyspnea on exertion - Primary    Likely deconditioning given unremarkable previous eval .. but given gradual worsening.. will repeat lab eval with TSH and cbc.  ECHO EF 65% in 2018,  EKG bradycardia,  Possible old MI in 2020  If worsening further or any chest pain.Marland Kitchen given aortic atherosclerosis.Marland Kitchen consider referral to cardiology for stress testing.      Relevant Orders   CBC with Differential/Platelet   TSH   HYPERTENSION, BENIGN (Chronic)     Above goal in office today.. follow at  home... continue metoprolol and lisinopril HCTZ.          Eliezer Lofts, MD

## 2021-03-26 NOTE — Assessment & Plan Note (Addendum)
Likely deconditioning given unremarkable previous eval .. but given gradual worsening.. will repeat lab eval with TSH and cbc.  ECHO EF 65% in 2018,  EKG bradycardia,  Possible old MI in 2020  If worsening further or any chest pain.[name] given aortic atherosclerosis.[name] consider referral to cardiology for stress testing.

## 2021-03-26 NOTE — Patient Instructions (Addendum)
Please stop at the lab to have labs drawn.  Follow blood pressure at home.. call if remaining > 150/90 after construction.

## 2021-03-26 NOTE — Assessment & Plan Note (Signed)
Not on statin given age.

## 2021-04-16 ENCOUNTER — Other Ambulatory Visit: Payer: Self-pay | Admitting: Family Medicine

## 2021-04-18 ENCOUNTER — Ambulatory Visit (INDEPENDENT_AMBULATORY_CARE_PROVIDER_SITE_OTHER): Payer: Medicare Other | Admitting: Dermatology

## 2021-04-18 ENCOUNTER — Other Ambulatory Visit: Payer: Self-pay

## 2021-04-18 DIAGNOSIS — Z86007 Personal history of in-situ neoplasm of skin: Secondary | ICD-10-CM

## 2021-04-18 DIAGNOSIS — Z1283 Encounter for screening for malignant neoplasm of skin: Secondary | ICD-10-CM

## 2021-04-18 DIAGNOSIS — L814 Other melanin hyperpigmentation: Secondary | ICD-10-CM

## 2021-04-18 DIAGNOSIS — L578 Other skin changes due to chronic exposure to nonionizing radiation: Secondary | ICD-10-CM | POA: Diagnosis not present

## 2021-04-18 DIAGNOSIS — I781 Nevus, non-neoplastic: Secondary | ICD-10-CM

## 2021-04-18 DIAGNOSIS — D229 Melanocytic nevi, unspecified: Secondary | ICD-10-CM | POA: Diagnosis not present

## 2021-04-18 DIAGNOSIS — H579 Unspecified disorder of eye and adnexa: Secondary | ICD-10-CM

## 2021-04-18 DIAGNOSIS — D1800 Hemangioma unspecified site: Secondary | ICD-10-CM

## 2021-04-18 DIAGNOSIS — L821 Other seborrheic keratosis: Secondary | ICD-10-CM | POA: Diagnosis not present

## 2021-04-18 NOTE — Patient Instructions (Signed)

## 2021-04-18 NOTE — Progress Notes (Signed)
   Follow-Up Visit   Subjective  [name] is a 87 y.o. female who presents for the following: FBSE (Patient here for full body skin exam and skin cancer screening. Patient with hx SCCis. She is not aware of any new or changing spots. ).  Patient accompanied by daughter.  The following portions of the chart were reviewed this encounter and updated as appropriate:   Tobacco  Allergies  Meds  Problems  Med Hx  Surg Hx  Fam Hx      Review of Systems:  No other skin or systemic complaints except as noted in HPI or Assessment and Plan.  Objective  Well appearing patient in no apparent distress; mood and affect are within normal limits.  A full examination was performed including scalp, head, eyes, ears, nose, lips, neck, chest, axillae, abdomen, back, buttocks, bilateral upper extremities, bilateral lower extremities, hands, feet, fingers, toes, fingernails, and toenails. All findings within normal limits unless otherwise noted below.  Right Eye Ectropion and inflammation at right lower eyelid margin  nasal dorsum and supratip Telangiectasia No other suspicious features visible on dermoscopy today and no textural change to the skin.    Assessment & Plan  Eye problem Right Eye  Recommend follow up with Dr. [facility]  Telangiectasia nasal dorsum and supratip  Benign-appearing. Recheck on follow up. Call clinic for new or changing lesions.  Recommend daily use of broad spectrum spf 30+ sunscreen to sun-exposed areas.     Lentigines - Scattered tan macules - Due to sun exposure - Benign-appering, observe - Recommend daily broad spectrum sunscreen SPF 30+ to sun-exposed areas, reapply every 2 hours as needed. - Call for any changes  Seborrheic Keratoses - Stuck-on, waxy, tan-brown papules and/or plaques  - Benign-appearing - Discussed benign etiology and prognosis. - Observe - Call for any changes  Melanocytic Nevi - Tan-brown and/or  pink-flesh-colored symmetric macules and papules - Benign appearing on exam today - Observation - Call clinic for new or changing moles - Recommend daily use of broad spectrum spf 30+ sunscreen to sun-exposed areas.   Hemangiomas - Red papules - Discussed benign nature - Observe - Call for any changes  Actinic Damage - Chronic condition, secondary to cumulative UV/sun exposure - diffuse scaly erythematous macules with underlying dyspigmentation - Recommend daily broad spectrum sunscreen SPF 30+ to sun-exposed areas, reapply every 2 hours as needed.  - Staying in the shade or wearing long sleeves, sun glasses (UVA+UVB protection) and wide brim hats (4-inch brim around the entire circumference of the hat) are also recommended for sun protection.  - Call for new or changing lesions.  Skin cancer screening performed today.  History of Squamous Cell Carcinoma in Situ of the Skin - No evidence of recurrence today - Recommend regular full body skin exams - Recommend daily broad spectrum sunscreen SPF 30+ to sun-exposed areas, reapply every 2 hours as needed.  - Call if any new or changing lesions are noted between office visits  Return in about 6 months (around 10/18/2021) for TBSE.  Graciella Belton, RMA, am acting as scribe for Forest Gleason, MD .   Documentation: I have reviewed the above documentation for accuracy and completeness, and I agree with the above.  Forest Gleason, MD

## 2021-04-29 ENCOUNTER — Other Ambulatory Visit: Payer: Self-pay

## 2021-04-29 ENCOUNTER — Ambulatory Visit
Admission: RE | Admit: 2021-04-29 | Discharge: 2021-04-29 | Disposition: A | Discharge status: Home / Self Care | Payer: Medicare Other | Source: Ambulatory Visit | Attending: Family Medicine | Admitting: Family Medicine

## 2021-04-29 DIAGNOSIS — Z1231 Encounter for screening mammogram for malignant neoplasm of breast: Secondary | ICD-10-CM

## 2021-05-05 ENCOUNTER — Encounter: Payer: Self-pay | Admitting: Dermatology

## 2021-05-24 ENCOUNTER — Other Ambulatory Visit: Payer: Self-pay | Admitting: Family Medicine

## 2021-05-29 DIAGNOSIS — H02132 Senile ectropion of right lower eyelid: Secondary | ICD-10-CM | POA: Diagnosis not present

## 2021-05-30 ENCOUNTER — Telehealth: Payer: Self-pay

## 2021-05-30 NOTE — Telephone Encounter (Signed)
Vm from pt's daughter, Thayer Headings (on dpr), stating pt tested pos for COVID this morning and wants rx sent to [facility].   Pt wants call back at [phone].  Spoke with Thayer Headings asking about sxs.  C/o chest/head congestion, HA and mild cough.  Denies any fever, body aches, SOB.  Sxs started last night and worsened this morning.  She wants COVID med sent to pharmacy for pt.

## 2021-05-30 NOTE — Telephone Encounter (Signed)
Per Butch Penny, pt will be scheduled for virtual phn visit (on video capability) tomorrow at 11:40 with Dr. Diona Browner.    Spoke with [facility] above and asked her to have pt's vitals ready when she gets call just before appt.  Janice verbalizes understanding.

## 2021-05-30 NOTE — Telephone Encounter (Signed)
Virtual visit scheduled with Dr. Diona Browner on 05/31/2021 at 11:40 am.

## 2021-05-31 ENCOUNTER — Other Ambulatory Visit: Payer: Self-pay

## 2021-05-31 ENCOUNTER — Telehealth (INDEPENDENT_AMBULATORY_CARE_PROVIDER_SITE_OTHER): Payer: Medicare Other | Admitting: Family Medicine

## 2021-05-31 DIAGNOSIS — U071 COVID-19: Secondary | ICD-10-CM

## 2021-05-31 MED ORDER — MOLNUPIRAVIR EUA 200MG CAPSULE: 4.0000 | ORAL_CAPSULE | Freq: Two times a day (BID) | ORAL | 0 refills | Status: AC

## 2021-05-31 NOTE — Progress Notes (Signed)
TELEPHONE VISIT  Due to national recommendations of social distancing due to [place] 19, Audio telehealth visit is felt to be most appropriate for this patient at this time.   I connected with [name] on 05/31/21 at 11:40 AM EDT by telephone and verified that I am speaking with the correct person using two identifiers.   I discussed the limitations, risks, security and privacy concerns of performing an evaluation and management service by telephone and the availability of in person appointments. I also discussed with the patient that there may be a patient responsible charge related to this service. The patient expressed understanding and agreed to proceed.  Patient location: Home Provider Location: [place] Participants: Eliezer Lofts and [name]  Chief Complaint  Patient presents with   Covid Positive    Onset of symptoms 8/10   Headache   Sore Throat   Diarrhea     History of Present Illness:  87 year old  elderly quadruple vaccinated female with HTN and overweight presents with COVID infection.. started with headache and sore throat on 05/29/2021   Today she has dry cough and diarrhea. No SOB, no wheeze No fever.  Daughter with COVID as well.  Has not used any medication to treat  BP 130 /70 at home today She is drinking water   COVID 19 screen COVID testing: home 05/30/2021 COVID vaccine:  Covid-19 Vaccine 03/19/2021  [place] COVID-19 Vaccine 08/05/2020 , 11/30/2019 , 11/09/2019   COVID exposure: No recent travel or known exposure to [place]  The importance of social distancing was discussed today.    Review of Systems  All other systems reviewed and are negative.    Past Medical History:  Diagnosis Date   Erosive esophagitis    Gastric ulcer    Gastritis    GERD (gastroesophageal reflux disease)    HLD (hyperlipidemia)    HTN (hypertension)    Osteoarthritis    Osteopenia    Prediabetes    pt does not check cbg at home    Squamous cell carcinoma of skin 09/21/2019   Mid vertex scalp  Squamous Cell Carcinoma in Situ   Squamous cell carcinoma of skin 08/30/2020   mid vertex scalp  SCCis     reports that she has never smoked. She has never used smokeless tobacco. She reports that she does not drink alcohol and does not use drugs.   Current Outpatient Medications:    aspirin 81 MG tablet, Take 81 mg by mouth daily., Disp: , Rfl:    colchicine 0.6 MG tablet, 1 tab po x 1 then repeat in 2 hours, continue daily until flare resolved., Disp: 30 tablet, Rfl: 1   fish oil-omega-3 fatty acids 1000 MG capsule, Take 2 g by mouth 2 (two) times daily. , Disp: , Rfl:    Glucosamine Sulfate 500 MG CAPS, Take 500 mg by mouth 2 (two) times daily., Disp: , Rfl:    hydroxypropyl methylcellulose (ISOPTO TEARS) 2.5 % ophthalmic solution, Place 1 drop into both eyes 3 (three) times daily as needed (dry eyes)., Disp: , Rfl:    lisinopril-hydrochlorothiazide (ZESTORETIC) 20-12.5 MG tablet, TAKE 2 TABLETS BY MOUTH  DAILY, Disp: 180 tablet, Rfl: 1   metoprolol tartrate (LOPRESSOR) 25 MG tablet, TAKE 1 TABLET BY MOUTH  TWICE DAILY (TAKE WITH 50  MG TABLET FOR A TOTAL OF 75 MG TWICE DAILY), Disp: 180 tablet, Rfl: 3   metoprolol tartrate (LOPRESSOR) 50 MG tablet, TAKE 1 TABLET BY MOUTH  TWICE  DAILY (TAKE WITH 25  MG FOR A TOTAL OF 75 MG  TWICE DAILY), Disp: 180 tablet, Rfl: 3   Multiple Vitamin (MULTIVITAMIN) tablet, Take 1 tablet by mouth daily., Disp: , Rfl:    neomycin-polymyxin b-dexamethasone (MAXITROL) 3.5-10000-0.1 OINT, , Disp: , Rfl:    omeprazole (PRILOSEC) 40 MG capsule, TAKE 1 CAPSULE BY MOUTH  DAILY, Disp: 90 capsule, Rfl: 1   OVER THE Bryant MEDICATION, I caps 1 tab bid, Disp: , Rfl:    sertraline (ZOLOFT) 50 MG tablet, TAKE 1 TABLET BY MOUTH  DAILY AT BEDTIME, Disp: 90 tablet, Rfl: 1   Observations/Objective: There were no vitals taken for this visit.  Physical Exam  Physical Exam Constitutional:      General: The patient  is not in acute distress. Pulmonary:     Effort: Pulmonary effort is normal. No respiratory distress.  Neurological:     Mental Status: The patient is alert and oriented to person, place, and time.  Psychiatric:        Mood and Affect: Mood normal.        Behavior: Behavior normal.   Assessment and Plan Problem List Items Addressed This Visit     COVID-19 - Primary    COVID19  infection. No clear sign of bacterial infection at this time. Mild to moderate symptoms on < day 5 of illness. No SOB.  No red flags/need for ER visit or in-person exam at respiratory clinic at this time..    Pt high risk for COVID complications given  HTN and age.  Discussed options to treat COVID including  Antivirals and MAB infusion.  She is not interested in MAB infusion.  GFR 47 in 09/2020. Cannot use paxlovid unless renally dosed. Chose instead to treat with molnupiravir, pt agreeable. No interactions.. If SOB begins symptoms worsening.. have low threshold for in-person exam, if severe shortness of breath ER visit recommended.   Can monitor Oxygen saturation at home with home monitor if able to obtain.  Go to ER if O2 sat < 90% on room air.  Reviewed home care and provided information through [place].  Recommended quarantine until test returns. If returns positive 5 days isolation recommended. Return to work day 6 and wear mask for 4 more days to complete 10 days. Provided info about prevention of spread of COVID 19.         I discussed the assessment and treatment plan with the patient. The patient was provided an opportunity to ask questions and all were answered. The patient agreed with the plan and demonstrated an understanding of the instructions.   The patient was advised to call back or seek an in-person evaluation if the symptoms worsen or if the condition fails to improve as anticipated.  I provided 15 minutes of non-face-to-face time during this encounter.   Eliezer Lofts, MD

## 2021-05-31 NOTE — Patient Instructions (Signed)
Use tylenol for pain.  Push fluids to keep up diarrhea loss.

## 2021-07-11 DIAGNOSIS — U071 COVID-19: Secondary | ICD-10-CM | POA: Insufficient documentation

## 2021-07-11 NOTE — Assessment & Plan Note (Signed)
COVID19  infection. No clear sign of bacterial infection at this time. Mild to moderate symptoms on < day 5 of illness. No SOB.  No red flags/need for ER visit or in-person exam at respiratory clinic at this time..    Pt high risk for COVID complications given  HTN and age.  Discussed options to treat COVID including  Antivirals and MAB infusion.  She is not interested in MAB infusion.  GFR 47 in 09/2020. Cannot use paxlovid unless renally dosed. Chose instead to treat with molnupiravir, pt agreeable. No interactions.. If SOB begins symptoms worsening.. have low threshold for in-person exam, if severe shortness of breath ER visit recommended.   Can monitor Oxygen saturation at home with home monitor if able to obtain.  Go to ER if O2 sat < 90% on room air.  Reviewed home care and provided information through [place].  Recommended quarantine until test returns. If returns positive 5 days isolation recommended. Return to work day 6 and wear mask for 4 more days to complete 10 days. Provided info about prevention of spread of COVID 19.

## 2021-08-20 ENCOUNTER — Telehealth: Payer: Self-pay | Admitting: *Deleted

## 2021-08-20 NOTE — Telephone Encounter (Signed)
No upcoming lab appointment scheduled. Please let patient know if she is due labs.

## 2021-08-20 NOTE — Telephone Encounter (Signed)
PLEASE NOTE: All timestamps contained within this report are represented as [place] Standard Time. CONFIDENTIALTY NOTICE: This fax transmission is intended only for the addressee. It contains information that is legally privileged, confidential or otherwise protected from use or disclosure. If you are not the intended recipient, you are strictly prohibited from reviewing, disclosing, copying using or disseminating any of this information or taking any action in reliance on or regarding this information. If you have received this fax in error, please notify us immediately by telephone so that we can arrange for its return to [place]. Phone: [phone], Toll-Free: [phone], Fax: [phone] Page: 1 of 1 Call Id: [id number] [place] Night - Client Nonclinical Telephone Record  [facility] Client [facility] Night - Client Client Site [place] Physician Eliezer Lofts - MD Contact Type Call Who Is Calling Patient / Member / Family / Caregiver Caller Name [place] Phone Number [phone] Patient Name [name] Patient DOB 06/24/1925 Call Type Message Only Information Provided Reason for Call Request for General Office Information Initial Comment Caller would like the date she is scheduled for labs. Additional Comment Provided Office Hours. Disp. Time Disposition Final User 08/19/2021 5:11:03 PM General Information Provided Yes Zebedee Iba Call Closed By: Zebedee Iba Transaction Date/Time: 08/19/2021 5:09:02 PM (ET)

## 2021-08-23 NOTE — Telephone Encounter (Signed)
Please schedule 40 minute CPE with fasting labs prior for patient for January 2023.

## 2021-08-23 NOTE — Telephone Encounter (Signed)
Pt has apt for cpe in January/lab in dec

## 2021-08-23 NOTE — Telephone Encounter (Signed)
Due for labs prior to her AMW visit. Looks like some time after 09/21/21... if it would be easier she can push labs and AMW back until we are back at [facility] in 10/2021

## 2021-09-09 DIAGNOSIS — H6123 Impacted cerumen, bilateral: Secondary | ICD-10-CM | POA: Diagnosis not present

## 2021-09-24 NOTE — Progress Notes (Signed)
Subjective:   [name] is a 87 y.o. female who presents for Medicare Annual (Subsequent) preventive examination.  I connected with Shane Melby today by telephone and verified that I am speaking with the correct person using two identifiers. Location patient: home Location provider: work Persons participating in the virtual visit: patient, Marine scientist.    I discussed the limitations, risks, security and privacy concerns of performing an evaluation and management service by telephone and the availability of in person appointments. I also discussed with the patient that there may be a patient responsible charge related to this service. The patient expressed understanding and verbally consented to this telephonic visit.    Interactive audio and video telecommunications were attempted between this provider and patient, however failed, due to patient having technical difficulties OR patient did not have access to video capability.  We continued and completed visit with audio only.  Some vital signs may be absent or patient reported.   Time Spent with patient on telephone encounter: 25 minutes  Review of Systems     Cardiac Risk Factors include: advanced age (>29men, >49 women);hypertension;dyslipidemia     Objective:    Today's Vitals   09/25/21 1027  Weight: 163 lb (73.9 kg)  Height: 5\' 3"  (1.6 m)   Body mass index is 28.87 kg/m.  Advanced Directives 09/25/2021 09/21/2020 09/20/2019 09/08/2018 06/23/2017 06/20/2016 05/30/2013  Does Patient Have a Medical Advance Directive? Yes Yes Yes Yes Yes Yes Patient has advance directive, copy in chart  Type of Advance Directive [place];Living will [place];Living will [place];Living will Living will Living will [place];Living will [place];Living will  Does patient want to make changes to medical advance directive? Yes (MAU/Ambulatory/Procedural  Areas - Information given) - - - - No - Patient declined -  Copy of [place] in Chart? - No - copy requested No - copy requested - - No - copy requested -  Pre-existing out of facility DNR order (yellow form or pink MOST form) - - - - - - No    Current Medications (verified) Outpatient Encounter Medications as of 09/25/2021  Medication Sig   aspirin 81 MG tablet Take 81 mg by mouth daily.   colchicine 0.6 MG tablet 1 tab po x 1 then repeat in 2 hours, continue daily until flare resolved.   fish oil-omega-3 fatty acids 1000 MG capsule Take 2 g by mouth 2 (two) times daily.    Glucosamine Sulfate 500 MG CAPS Take 500 mg by mouth 2 (two) times daily.   hydroxypropyl methylcellulose (ISOPTO TEARS) 2.5 % ophthalmic solution Place 1 drop into both eyes 3 (three) times daily as needed (dry eyes).   lisinopril-hydrochlorothiazide (ZESTORETIC) 20-12.5 MG tablet TAKE 2 TABLETS BY MOUTH  DAILY   metoprolol tartrate (LOPRESSOR) 25 MG tablet TAKE 1 TABLET BY MOUTH  TWICE DAILY (TAKE WITH 50  MG TABLET FOR A TOTAL OF 75 MG TWICE DAILY)   metoprolol tartrate (LOPRESSOR) 50 MG tablet TAKE 1 TABLET BY MOUTH  TWICE DAILY (TAKE WITH 25  MG FOR A TOTAL OF 75 MG  TWICE DAILY)   Multiple Vitamin (MULTIVITAMIN) tablet Take 1 tablet by mouth daily.   neomycin-polymyxin b-dexamethasone (MAXITROL) 3.5-10000-0.1 OINT    omeprazole (PRILOSEC) 40 MG capsule TAKE 1 CAPSULE BY MOUTH  DAILY   OVER THE COUNTER MEDICATION I caps 1 tab bid   sertraline (ZOLOFT) 50 MG tablet TAKE 1 TABLET BY  MOUTH  DAILY AT BEDTIME   No facility-administered encounter medications on file as of 09/25/2021.    Allergies (verified) Patient has no known allergies.   History: Past Medical History:  Diagnosis Date   Erosive esophagitis    Gastric ulcer    Gastritis    GERD (gastroesophageal reflux disease)    HLD (hyperlipidemia)    HTN (hypertension)    Osteoarthritis    Osteopenia    Prediabetes    pt does not  check cbg at home   Squamous cell carcinoma of skin 09/21/2019   Mid vertex scalp  Squamous Cell Carcinoma in Situ   Squamous cell carcinoma of skin 08/30/2020   mid vertex scalp  SCCis    Past Surgical History:  Procedure Laterality Date   APPENDECTOMY  yrs ago   arthroscopic knee surgery Left yrs ago   benign nodule removed from neck  05-21-2013   EYE SURGERY Bilateral 9- 9-19-2011and 07-2010   cataract lens replacement   KNEE SURGERY Left 05/2009   to cut nerves   left knee replacement  09-30-2005   nuclear cardiolyte  10/08   right knee replacement Right 08-10-2013   right shoulder surgery  2005   TOTAL KNEE REVISION Left 05/30/2013   Procedure: LEFT TOTAL KNEE REVISION;  Surgeon: Mauri Pole, MD;  Location: [facility];  Service: Orthopedics;  Laterality: Left;   Family History  Problem Relation Age of Onset   Coronary artery disease Father 76   Hypertension Sister        x 4   Breast cancer Sister 73   Breast cancer Daughter 41   Social History   Socioeconomic History   Marital status: Widowed    Spouse name: Not on file   Number of children: Not on file   Years of education: Not on file   Highest education level: Not on file  Occupational History   Not on file  Tobacco Use   Smoking status: Never   Smokeless tobacco: Never  Vaping Use   Vaping Use: Never used  Substance and Sexual Activity   Alcohol use: No    Alcohol/week: 0.0 standard drinks   Drug use: No   Sexual activity: Not Currently  Other Topics Concern   Not on file  Social History Narrative   Full code, has living will, HCPOA children (reviewed 05/2014)   Social Determinants of Health   Financial Resource Strain: Low Risk    Difficulty of Paying Living Expenses: Not hard at all  Food Insecurity: No Food Insecurity   Worried About Charity fundraiser in the Last Year: Never true   [place] in the Last Year: Never true  Transportation Needs: No Transportation Needs   Lack of  Transportation (Medical): No   Lack of Transportation (Non-Medical): No  Physical Activity: Inactive   Days of Exercise per Week: 0 days   Minutes of Exercise per Session: 0 min  Stress: No Stress Concern Present   Feeling of Stress : Not at all  Social Connections: Moderately Isolated   Frequency of Communication with Friends and Family: More than three times a week   Frequency of Social Gatherings with Friends and Family: More than three times a week   Attends Religious Services: More than 4 times per year   Active Member of [employer] or Organizations: No   Attends Archivist Meetings: Never   Marital Status: Widowed    Tobacco Counseling Counseling given: Not Answered  Clinical Intake:  Pre-visit preparation completed: Yes  Pain : No/denies pain     BMI - recorded: 28.87 Nutritional Status: BMI 25 -29 Overweight Nutritional Risks: None Diabetes: No  How often do you need to have someone help you when you read instructions, pamphlets, or other written materials from your doctor or pharmacy?: 1 - Never  Diabetic?No  Interpreter Needed?: No  Information entered by :: Orrin Brigham LPN   Activities of Daily Living In your present state of health, do you have any difficulty performing the following activities: 09/25/2021  Hearing? Y  Comment wears hearing aids  Vision? N  Difficulty concentrating or making decisions? N  Walking or climbing stairs? N  Dressing or bathing? N  Doing errands, shopping? Y  Comment daughter drives  Conservation officer, nature and eating ? N  Using the Toilet? N  In the past six months, have you accidently leaked urine? Y  Comment sometimes  Do you have problems with loss of bowel control? N  Managing your Medications? N  Managing your Finances? Y  Comment daughter assist  Housekeeping or managing your Housekeeping? N  Some recent data might be hidden    Patient Care Team: Jinny Sanders, MD as PCP - General  Indicate any recent  Medical Services you may have received from other than [facility] providers in the past year (date may be approximate).     Assessment:   This is a routine wellness examination for [facility].  Hearing/Vision screen Hearing Screening - Comments:: Wears hearing aids Vision Screening - Comments:: Last eye exam 2022, Dr. Arnell Asal  Dietary issues and exercise activities discussed: Current Exercise Habits: The patient does not participate in regular exercise at present   Goals Addressed             This Visit's Progress    Patient Stated       Would like to drink more water       Depression Screen PHQ 2/9 Scores 09/25/2021 09/21/2020 09/20/2019 09/08/2018 06/23/2017 04/03/2017 04/03/2017  PHQ - 2 Score 0 0 2 0 1 3 2   PHQ- 9 Score - 0 2 0 1 8 3     Fall Risk Fall Risk  09/25/2021 09/21/2020 09/20/2019 09/08/2018 09/08/2018  Falls in the past year? 0 0 0 0 0  Comment - - - - Emmi Telephone Survey: data to providers prior to load  Number falls in past yr: 0 0 0 - -  Injury with Fall? 0 0 0 - -  Risk for fall due to : - Impaired vision;Medication side effect Medication side effect - -  Follow up Falls prevention discussed Falls prevention discussed;Falls evaluation completed Falls evaluation completed;Falls prevention discussed - -    FALL RISK PREVENTION PERTAINING TO THE HOME:  Any stairs in or around the home? Yes  If so, are there any without handrails? No  Home free of loose throw rugs in walkways, pet beds, electrical cords, etc? Yes  Adequate lighting in your home to reduce risk of falls? Yes   ASSISTIVE DEVICES UTILIZED TO PREVENT FALLS:  Life alert? No  Use of a cane, walker or w/c? No  Grab bars in the bathroom? No  Shower chair or bench in shower? Yes  Elevated toilet seat or a handicapped toilet? Yes   TIMED UP AND GO:  Was the test performed? No , visit completed over the phone.   Cognitive Function: Normal cognitive status assessed by this Nurse Health Advisor. No  abnormalities found.  MMSE - Mini Mental State Exam 09/21/2020 09/20/2019 09/08/2018 06/23/2017  Orientation to time 5 5 5 5   Orientation to Place 5 5 5 5   Registration 3 3 3 3   Attention/ Calculation 5 5 0 0  Recall 3 3 3 3   Language- name 2 objects - - 0 0  Language- repeat 1 1 1 1   Language- follow 3 step command - - 3 3  Language- read & follow direction - - 0 0  Write a sentence - - 0 0  Copy design - - 0 0  Total score - - 20 20        Immunizations Immunization History  Administered Date(s) Administered   Fluad Quad(high Dose 65+) 07/21/2019, 08/23/2020   H1N1 11/06/2008   Influenza Split 07/22/2012   Influenza Whole 07/21/2007, 07/20/2008, 08/20/2010   Influenza, High Dose Seasonal PF 08/29/2021   Influenza,inj,Quad PF,6+ Mos 06/26/2017, 09/08/2018   Influenza-Unspecified 07/20/2016   PFIZER Comirnaty(Gray Top)Covid-19 Tri-Sucrose Vaccine 03/19/2021   PFIZER(Purple Top)SARS-COV-2 Vaccination 11/09/2019, 11/30/2019, 08/05/2020   Pneumococcal Conjugate-13 06/13/2014   Pneumococcal Polysaccharide-23 10/20/1998   Td 10/20/1998, 11/06/2008   Zoster, Live 10/20/2008    TDAP status:, due information provided on how to obtain vaccine  Flu Vaccine status: Up to date  Pneumococcal vaccine status: Up to date  Covid-19 vaccine status: Information provided on how to obtain vaccines.   Qualifies for Shingles Vaccine? Yes   Zostavax completed Yes   Shingrix Completed?: No.    Education has been provided regarding the importance of this vaccine. Patient has been advised to call insurance company to determine out of pocket expense if they have not yet received this vaccine. Advised may also receive vaccine at local pharmacy or Health Dept. Verbalized acceptance and understanding.  Screening Tests Health Maintenance  Topic Date Due   Zoster Vaccines- Shingrix (1 of 2) Never done   COVID-19 Vaccine (5 - Booster for Pfizer series) 05/14/2021   TETANUS/TDAP  09/21/2024  (Originally 11/06/2018)   DEXA SCAN  11/10/2022   MAMMOGRAM  04/30/2023   Pneumonia Vaccine 92+ Years old  Completed   INFLUENZA VACCINE  Completed   HPV VACCINES  Aged Out    Health Maintenance  Health Maintenance Due  Topic Date Due   Zoster Vaccines- Shingrix (1 of 2) Never done   COVID-19 Vaccine (5 - Booster for Pfizer series) 05/14/2021    Colorectal cancer screening: No longer required.   Mammogram status: Completed 04/29/21. Repeat every year  Bone Density status: No longer required  Lung Cancer Screening: (Low Dose CT Chest recommended if Age 63-80 years, 30 pack-year currently smoking OR have quit w/in 15years.) does not qualify.     Additional Screening:  Hepatitis C Screening: does not qualify  Vision Screening: Recommended annual ophthalmology exams for early detection of glaucoma and other disorders of the eye. Is the patient up to date with their annual eye exam?  Yes  Who is the provider or what is the name of the office in which the patient attends annual eye exams? Dr. Arnell Asal    Dental Screening: Recommended annual dental exams for proper oral hygiene  Community Resource Referral / Chronic Care Management: CRR required this visit?  No   CCM required this visit?  No      Plan:     I have personally reviewed and noted the following in the patient's chart:   Medical and social history Use of alcohol, tobacco or illicit drugs  Current medications and supplements including opioid  prescriptions.  Functional ability and status Nutritional status Physical activity Advanced directives List of other physicians Hospitalizations, surgeries, and ER visits in previous 12 months Vitals Screenings to include cognitive, depression, and falls Referrals and appointments  In addition, I have reviewed and discussed with patient certain preventive protocols, quality metrics, and best practice recommendations. A written personalized care plan for preventive  services as well as general preventive health recommendations were provided to patient.    Due to this being a telephonic visit, the after visit summary with patients personalized plan was offered to patient via mail or my-chart.  Patient preferred to pick up at office at next visit.    Loma Messing, LPN   [id number]   Nurse Health Advisor  Nurse Notes: none

## 2021-09-25 ENCOUNTER — Ambulatory Visit (INDEPENDENT_AMBULATORY_CARE_PROVIDER_SITE_OTHER): Payer: Medicare Other

## 2021-09-25 VITALS — Ht 63.0 in | Wt 163.0 lb

## 2021-09-25 DIAGNOSIS — Z0000 Encounter for general adult medical examination without abnormal findings: Secondary | ICD-10-CM | POA: Diagnosis not present

## 2021-09-25 NOTE — Patient Instructions (Signed)
[name] , Thank you for taking time to complete your Medicare Wellness Visit. I appreciate your ongoing commitment to your health goals. Please review the following plan we discussed and let me know if I can assist you in the future.   Screening recommendations/referrals: Colonoscopy: no longer required Mammogram: up to date, completed 04/29/21, due 04/29/22 Bone Density: no longer required  Recommended yearly ophthalmology/optometry visit for glaucoma screening and checkup Recommended yearly dental visit for hygiene and checkup  Vaccinations: Influenza vaccine: up to date Pneumococcal vaccine: up to date Tdap vaccine: due, last completed 11/06/08, Discuss with your local pharmacy Shingles vaccine:  Discuss with your local pharmacy Covid-19: newest booster available at your local pharmacy  Advanced directives: Please bring a copy of Living Will and/or Healthcare Power of Attorney for your chart.   Conditions/risks identified: see problem list  Next appointment: Follow up in one year for your annual wellness visit    Preventive Care 65 Years and Older, Female Preventive care refers to lifestyle choices and visits with your health care provider that can promote health and wellness. What does preventive care include? A yearly physical exam. This is also called an annual well check. Dental exams once or twice a year. Routine eye exams. Ask your health care provider how often you should have your eyes checked. Personal lifestyle choices, including: Daily care of your teeth and gums. Regular physical activity. Eating a healthy diet. Avoiding tobacco and drug use. Limiting alcohol use. Practicing safe sex. Taking low-dose aspirin every day. Taking vitamin and mineral supplements as recommended by your health care provider. What happens during an annual well check? The services and screenings done by your health care provider during your annual well check will depend on your age,  overall health, lifestyle risk factors, and family history of disease. Counseling  Your health care provider may ask you questions about your: Alcohol use. Tobacco use. Drug use. Emotional well-being. Home and relationship well-being. Sexual activity. Eating habits. History of falls. Memory and ability to understand (cognition). Work and work Statistician. Reproductive health. Screening  You may have the following tests or measurements: Height, weight, and BMI. Blood pressure. Lipid and cholesterol levels. These may be checked every 5 years, or more frequently if you are over 17 years old. Skin check. Lung cancer screening. You may have this screening every year starting at age 55 if you have a 30-pack-year history of smoking and currently smoke or have quit within the past 15 years. Fecal occult blood test (FOBT) of the stool. You may have this test every year starting at age 18. Flexible sigmoidoscopy or colonoscopy. You may have a sigmoidoscopy every 5 years or a colonoscopy every 10 years starting at age 51. Hepatitis C blood test. Hepatitis B blood test. Sexually transmitted disease (STD) testing. Diabetes screening. This is done by checking your blood sugar (glucose) after you have not eaten for a while (fasting). You may have this done every 1-3 years. Bone density scan. This is done to screen for osteoporosis. You may have this done starting at age 49. Mammogram. This may be done every 1-2 years. Talk to your health care provider about how often you should have regular mammograms. Talk with your health care provider about your test results, treatment options, and if necessary, the need for more tests. Vaccines  Your health care provider may recommend certain vaccines, such as: Influenza vaccine. This is recommended every year. Tetanus, diphtheria, and acellular pertussis (Tdap, Td) vaccine. You may need a  Td booster every 10 years. Zoster vaccine. You may need this after age  90. Pneumococcal 13-valent conjugate (PCV13) vaccine. One dose is recommended after age 16. Pneumococcal polysaccharide (PPSV23) vaccine. One dose is recommended after age 69. Talk to your health care provider about which screenings and vaccines you need and how often you need them. This information is not intended to replace advice given to you by your health care provider. Make sure you discuss any questions you have with your health care provider. Document Released: 11/02/2015 Document Revised: 06/25/2016 Document Reviewed: 08/07/2015 Elsevier Interactive Patient Education  2017 [employer] Prevention in the Home Falls can cause injuries. They can happen to people of all ages. There are many things you can do to make your home safe and to help prevent falls. What can I do on the outside of my home? Regularly fix the edges of walkways and driveways and fix any cracks. Remove anything that might make you trip as you walk through a door, such as a raised step or threshold. Trim any bushes or trees on the path to your home. Use bright outdoor lighting. Clear any walking paths of anything that might make someone trip, such as rocks or tools. Regularly check to see if handrails are loose or broken. Make sure that both sides of any steps have handrails. Any raised decks and porches should have guardrails on the edges. Have any leaves, snow, or ice cleared regularly. Use sand or salt on walking paths during winter. Clean up any spills in your garage right away. This includes oil or grease spills. What can I do in the bathroom? Use night lights. Install grab bars by the toilet and in the tub and shower. Do not use towel bars as grab bars. Use non-skid mats or decals in the tub or shower. If you need to sit down in the shower, use a plastic, non-slip stool. Keep the floor dry. Clean up any water that spills on the floor as soon as it happens. Remove soap buildup in the tub or shower  regularly. Attach bath mats securely with double-sided non-slip rug tape. Do not have throw rugs and other things on the floor that can make you trip. What can I do in the bedroom? Use night lights. Make sure that you have a light by your bed that is easy to reach. Do not use any sheets or blankets that are too big for your bed. They should not hang down onto the floor. Have a firm chair that has side arms. You can use this for support while you get dressed. Do not have throw rugs and other things on the floor that can make you trip. What can I do in the kitchen? Clean up any spills right away. Avoid walking on wet floors. Keep items that you use a lot in easy-to-reach places. If you need to reach something above you, use a strong step stool that has a grab bar. Keep electrical cords out of the way. Do not use floor polish or wax that makes floors slippery. If you must use wax, use non-skid floor wax. Do not have throw rugs and other things on the floor that can make you trip. What can I do with my stairs? Do not leave any items on the stairs. Make sure that there are handrails on both sides of the stairs and use them. Fix handrails that are broken or loose. Make sure that handrails are as long as the stairways. Check any  carpeting to make sure that it is firmly attached to the stairs. Fix any carpet that is loose or worn. Avoid having throw rugs at the top or bottom of the stairs. If you do have throw rugs, attach them to the floor with carpet tape. Make sure that you have a light switch at the top of the stairs and the bottom of the stairs. If you do not have them, ask someone to add them for you. What else can I do to help prevent falls? Wear shoes that: Do not have high heels. Have rubber bottoms. Are comfortable and fit you well. Are closed at the toe. Do not wear sandals. If you use a stepladder: Make sure that it is fully opened. Do not climb a closed stepladder. Make sure that  both sides of the stepladder are locked into place. Ask someone to hold it for you, if possible. Clearly mark and make sure that you can see: Any grab bars or handrails. First and last steps. Where the edge of each step is. Use tools that help you move around (mobility aids) if they are needed. These include: Canes. Walkers. Scooters. Crutches. Turn on the lights when you go into a dark area. Replace any light bulbs as soon as they burn out. Set up your furniture so you have a clear path. Avoid moving your furniture around. If any of your floors are uneven, fix them. If there are any pets around you, be aware of where they are. Review your medicines with your doctor. Some medicines can make you feel dizzy. This can increase your chance of falling. Ask your doctor what other things that you can do to help prevent falls. This information is not intended to replace advice given to you by your health care provider. Make sure you discuss any questions you have with your health care provider. Document Released: 08/02/2009 Document Revised: 03/13/2016 Document Reviewed: 11/10/2014 Elsevier Interactive Patient Education  2017 [employer].

## 2021-10-04 ENCOUNTER — Telehealth: Payer: Self-pay | Admitting: Family Medicine

## 2021-10-04 DIAGNOSIS — E559 Vitamin D deficiency, unspecified: Secondary | ICD-10-CM

## 2021-10-04 DIAGNOSIS — R7303 Prediabetes: Secondary | ICD-10-CM

## 2021-10-04 DIAGNOSIS — E782 Mixed hyperlipidemia: Secondary | ICD-10-CM

## 2021-10-04 DIAGNOSIS — M109 Gout, unspecified: Secondary | ICD-10-CM

## 2021-10-04 NOTE — Telephone Encounter (Signed)
-----   Message from Ellamae Sia sent at 10/03/2021  8:19 AM EST ----- Regarding: Lab orders for Friday, 12.30.22 Patient is scheduled for CPX labs, please order future labs, Thanks , Karna Christmas

## 2021-10-18 ENCOUNTER — Other Ambulatory Visit: Payer: Medicare Other

## 2021-10-22 ENCOUNTER — Other Ambulatory Visit: Payer: Self-pay | Admitting: Family Medicine

## 2021-10-25 ENCOUNTER — Encounter: Payer: Medicare Other | Admitting: Family Medicine

## 2021-10-31 ENCOUNTER — Other Ambulatory Visit: Payer: Self-pay

## 2021-10-31 ENCOUNTER — Ambulatory Visit: Payer: Medicare Other | Admitting: Dermatology

## 2021-10-31 DIAGNOSIS — D1800 Hemangioma unspecified site: Secondary | ICD-10-CM

## 2021-10-31 DIAGNOSIS — Z85828 Personal history of other malignant neoplasm of skin: Secondary | ICD-10-CM

## 2021-10-31 DIAGNOSIS — Z1283 Encounter for screening for malignant neoplasm of skin: Secondary | ICD-10-CM

## 2021-10-31 DIAGNOSIS — L578 Other skin changes due to chronic exposure to nonionizing radiation: Secondary | ICD-10-CM | POA: Diagnosis not present

## 2021-10-31 DIAGNOSIS — L570 Actinic keratosis: Secondary | ICD-10-CM

## 2021-10-31 DIAGNOSIS — L814 Other melanin hyperpigmentation: Secondary | ICD-10-CM | POA: Diagnosis not present

## 2021-10-31 DIAGNOSIS — L821 Other seborrheic keratosis: Secondary | ICD-10-CM | POA: Diagnosis not present

## 2021-10-31 DIAGNOSIS — D229 Melanocytic nevi, unspecified: Secondary | ICD-10-CM | POA: Diagnosis not present

## 2021-10-31 DIAGNOSIS — L089 Local infection of the skin and subcutaneous tissue, unspecified: Secondary | ICD-10-CM | POA: Diagnosis not present

## 2021-10-31 MED ORDER — MUPIROCIN 2 % EX OINT: 1.0000 "application " | TOPICAL_OINTMENT | Freq: Three times a day (TID) | CUTANEOUS | 0 refills | Status: AC

## 2021-10-31 NOTE — Progress Notes (Signed)
Follow-Up Visit   Subjective  [name] is a 88 y.o. female who presents for the following: FBSE (Patient here for full body skin exam and skin cancer screening. Patient with hx of SCC. She is not aware of any new or changing spots. ).   The following portions of the chart were reviewed this encounter and updated as appropriate:   Tobacco   Allergies   Meds   Problems   Med Hx   Surg Hx   Fam Hx       Review of Systems:  No other skin or systemic complaints except as noted in HPI or Assessment and Plan.  Objective  Well appearing patient in no apparent distress; mood and affect are within normal limits.  A full examination was performed including scalp, head, eyes, ears, nose, lips, neck, chest, axillae, abdomen, back, buttocks, bilateral upper extremities, bilateral lower extremities, hands, feet, fingers, toes, fingernails, and toenails. All findings within normal limits unless otherwise noted below.  Left Dorsal Hand x 3, left upper arm x 1, left ankle x 1, right dorsal hand x 1, right forearm x 2, right elbow x 1, left scalp x 2, nasal dorsum x 2, right upper back x 1 (14) Erythematous thin papules/macules with gritty scale.   Right Ear Pustule with very slight and localized surrounding erythema    Assessment & Plan  AK (actinic keratosis) (14) Left Dorsal Hand x 3, left upper arm x 1, left ankle x 1, right dorsal hand x 1, right forearm x 2, right elbow x 1, left scalp x 2, nasal dorsum x 2, right upper back x 1  Prior to procedure, discussed risks of blister formation, small wound, skin dyspigmentation, or rare scar following cryotherapy. Recommend Vaseline ointment to treated areas while healing.   Actinic keratoses are precancerous spots that appear secondary to cumulative UV radiation exposure/sun exposure over time. They are chronic with expected duration over 1 year. A portion of actinic keratoses will progress to squamous cell carcinoma of the skin. It is not  possible to reliably predict which spots will progress to skin cancer and so treatment is recommended to prevent development of skin cancer.  Recommend daily broad spectrum sunscreen SPF 30+ to sun-exposed areas, reapply every 2 hours as needed.  Recommend staying in the shade or wearing long sleeves, sun glasses (UVA+UVB protection) and wide brim hats (4-inch brim around the entire circumference of the hat). Call for new or changing lesions.   Destruction of lesion - Left Dorsal Hand x 3, left upper arm x 1, left ankle x 1, right dorsal hand x 1, right forearm x 2, right elbow x 1, left scalp x 2, nasal dorsum x 2, right upper back x 1  Destruction method: cryotherapy   Informed consent: discussed and consent obtained   Lesion destroyed using liquid nitrogen: Yes   Cryotherapy cycles:  2 Outcome: patient tolerated procedure well with no complications   Post-procedure details: wound care instructions given    Pustule Right Ear  Start mupirocin to affected area at left ear three times daily.  Call or seek medical care for any worsening or if this does not clear up in the next week  mupirocin ointment (BACTROBAN) 2 % - Right Ear Apply 1 application topically 3 (three) times daily.  Related Procedures Anaerobic and Aerobic Culture  Lentigines - Scattered tan macules - Due to sun exposure - Benign-appearing, observe - Recommend daily broad spectrum sunscreen SPF 30+ to sun-exposed areas,  reapply every 2 hours as needed. - Call for any changes  Seborrheic Keratoses - Stuck-on, waxy, tan-brown papules and/or plaques  - Benign-appearing - Discussed benign etiology and prognosis. - Observe - Call for any changes  Melanocytic Nevi - Tan-brown and/or pink-flesh-colored symmetric macules and papules - Benign appearing on exam today - Observation - Call clinic for new or changing moles - Recommend daily use of broad spectrum spf 30+ sunscreen to sun-exposed areas.    Hemangiomas - Red papules - Discussed benign nature - Observe - Call for any changes  Actinic Damage - Chronic condition, secondary to cumulative UV/sun exposure - diffuse scaly erythematous macules with underlying dyspigmentation - Recommend daily broad spectrum sunscreen SPF 30+ to sun-exposed areas, reapply every 2 hours as needed.  - Staying in the shade or wearing long sleeves, sun glasses (UVA+UVB protection) and wide brim hats (4-inch brim around the entire circumference of the hat) are also recommended for sun protection.  - Call for new or changing lesions.  Skin cancer screening performed today.  History of Squamous Cell Carcinoma of the Skin - No evidence of recurrence today - No lymphadenopathy - Recommend regular full body skin exams - Recommend daily broad spectrum sunscreen SPF 30+ to sun-exposed areas, reapply every 2 hours as needed.  - Call if any new or changing lesions are noted between office visits    Return for TBSE 6-12 months.  Graciella Belton, RMA, am acting as scribe for Forest Gleason, MD .  Documentation: I have reviewed the above documentation for accuracy and completeness, and I agree with the above.  Forest Gleason, MD

## 2021-10-31 NOTE — Patient Instructions (Addendum)
Cryotherapy Aftercare  Wash gently with soap and water everyday.   Apply Vaseline and Band-Aid daily until healed.   Prior to procedure, discussed risks of blister formation, small wound, skin dyspigmentation, or rare scar following cryotherapy. Recommend Vaseline ointment to treated areas while healing.  Melanoma ABCDEs  Melanoma is the most dangerous type of skin cancer, and is the leading cause of death from skin disease.  You are more likely to develop melanoma if you: Have light-colored skin, light-colored eyes, or red or blond hair Spend a lot of time in the sun Tan regularly, either outdoors or in a tanning bed Have had blistering sunburns, especially during childhood Have a close family member who has had a melanoma Have atypical moles or large birthmarks  Early detection of melanoma is key since treatment is typically straightforward and cure rates are extremely high if we catch it early.   The first sign of melanoma is often a change in a mole or a new dark spot.  The ABCDE system is a way of remembering the signs of melanoma.  A for asymmetry:  The two halves do not match. B for border:  The edges of the growth are irregular. C for color:  A mixture of colors are present instead of an even brown color. D for diameter:  Melanomas are usually (but not always) greater than 6mm - the size of a pencil eraser. E for evolution:  The spot keeps changing in size, shape, and color.  Please check your skin once per month between visits. You can use a small mirror in front and a large mirror behind you to keep an eye on the back side or your body.   If you see any new or changing lesions before your next follow-up, please call to schedule a visit.  Please continue daily skin protection including broad spectrum sunscreen SPF 30+ to sun-exposed areas, reapplying every 2 hours as needed when you're outdoors.    If You Need Anything After Your Visit  If you have any questions or  concerns for your doctor, please call our main line at [phone] and press option 4 to reach your doctor's medical assistant. If no one answers, please leave a voicemail as directed and we will return your call as soon as possible. Messages left after 4 pm will be answered the following business day.   You may also send us a message via MyChart. We typically respond to MyChart messages within 1-2 business days.  For prescription refills, please ask your pharmacy to contact our office. Our fax number is [phone].  If you have an urgent issue when the clinic is closed that cannot wait until the next business day, you can page your doctor at the number below.    Please note that while we do our best to be available for urgent issues outside of office hours, we are not available 24/7.   If you have an urgent issue and are unable to reach us, you may choose to seek medical care at your doctor's office, retail clinic, urgent care center, or emergency room.  If you have a medical emergency, please immediately call 911 or go to the emergency department.  Pager Numbers  - Dr. Kowalski: [phone]  - Dr. Moye: [phone]  - Dr. Stewart: [phone]  In the event of inclement weather, please call our main line at [phone] for an update on the status of any delays or closures.  Dermatology Medication Tips: Please keep the boxes that   topical medications come in in order to help keep track of the instructions about where and how to use these. Pharmacies typically print the medication instructions only on the boxes and not directly on the medication tubes.   If your medication is too expensive, please contact our office at [phone] option 4 or send us a message through MyChart.   We are unable to tell what your co-pay for medications will be in advance as this is different depending on your insurance coverage. However, we may be able to find a substitute medication at lower cost or  fill out paperwork to get insurance to cover a needed medication.   If a prior authorization is required to get your medication covered by your insurance company, please allow us 1-2 business days to complete this process.  Drug prices often vary depending on where the prescription is filled and some pharmacies may offer cheaper prices.  The website www.goodrx.com contains coupons for medications through different pharmacies. The prices here do not account for what the cost may be with help from insurance (it may be cheaper with your insurance), but the website can give you the price if you did not use any insurance.  - You can print the associated coupon and take it with your prescription to the pharmacy.  - You may also stop by our office during regular business hours and pick up a GoodRx coupon card.  - If you need your prescription sent electronically to a different pharmacy, notify our office through [facility] MyChart or by phone at [phone] option 4.     Si Usted Necesita Algo Despus de Su Visita  Tambin puede enviarnos un mensaje a travs de MyChart. Por lo general respondemos a los mensajes de MyChart en el transcurso de 1 a 2 das hbiles.  Para renovar recetas, por favor pida a su farmacia que se ponga en contacto con nuestra oficina. Nuestro nmero de fax es el [phone].  Si tiene un asunto urgente cuando la clnica est cerrada y que no puede esperar hasta el siguiente da hbil, puede llamar/localizar a su doctor(a) al nmero que aparece a continuacin.   Por favor, tenga en cuenta que aunque hacemos todo lo posible para estar disponibles para asuntos urgentes fuera del horario de oficina, no estamos disponibles las 24 horas del da, los 7 das de la semana.   Si tiene un problema urgente y no puede comunicarse con nosotros, puede optar por buscar atencin mdica  en el consultorio de su doctor(a), en una clnica privada, en un centro de atencin urgente o en una sala  de emergencias.  Si tiene una emergencia mdica, por favor llame inmediatamente al 911 o vaya a la sala de emergencias.  Nmeros de bper  - Dr. Kowalski: [phone]  - Dra. Moye: [phone]  - Dra. Stewart: [phone]  En caso de inclemencias del tiempo, por favor llame a nuestra lnea principal al [phone] para una actualizacin sobre el estado de cualquier retraso o cierre.  Consejos para la medicacin en dermatologa: Por favor, guarde las cajas en las que vienen los medicamentos de uso tpico para ayudarle a seguir las instrucciones sobre dnde y cmo usarlos. Las farmacias generalmente imprimen las instrucciones del medicamento slo en las cajas y no directamente en los tubos del medicamento.   Si su medicamento es muy caro, por favor, pngase en contacto con nuestra oficina llamando al [phone] y presione la opcin 4 o envenos un mensaje a travs de MyChart.   No   podemos decirle cul ser su copago por los medicamentos por adelantado ya que esto es diferente dependiendo de la cobertura de su seguro. Sin embargo, es posible que podamos encontrar un medicamento sustituto a menor costo o llenar un formulario para que el seguro cubra el medicamento que se considera necesario.   Si se requiere una autorizacin previa para que su compaa de seguros cubra su medicamento, por favor permtanos de 1 a 2 das hbiles para completar este proceso.  Los precios de los medicamentos varan con frecuencia dependiendo del lugar de dnde se surte la receta y alguna farmacias pueden ofrecer precios ms baratos.  El sitio web www.goodrx.com tiene cupones para medicamentos de diferentes farmacias. Los precios aqu no tienen en cuenta lo que podra costar con la ayuda del seguro (puede ser ms barato con su seguro), pero el sitio web puede darle el precio si no utiliz ningn seguro.  - Puede imprimir el cupn correspondiente y llevarlo con su receta a la farmacia.  - Tambin puede pasar  por nuestra oficina durante el horario de atencin regular y recoger una tarjeta de cupones de GoodRx.  - Si necesita que su receta se enve electrnicamente a una farmacia diferente, informe a nuestra oficina a travs de MyChart de [facility] o por telfono llamando al [phone] y presione la opcin 4.  

## 2021-11-06 ENCOUNTER — Other Ambulatory Visit: Payer: Self-pay

## 2021-11-06 ENCOUNTER — Emergency Department (HOSPITAL_BASED_OUTPATIENT_CLINIC_OR_DEPARTMENT_OTHER)
Admission: EM | Admit: 2021-11-06 | Discharge: 2021-11-06 | Disposition: A | Discharge status: Home / Self Care | Payer: Medicare Other | Attending: Emergency Medicine | Admitting: Emergency Medicine

## 2021-11-06 ENCOUNTER — Encounter (HOSPITAL_BASED_OUTPATIENT_CLINIC_OR_DEPARTMENT_OTHER): Payer: Self-pay

## 2021-11-06 ENCOUNTER — Ambulatory Visit
Admission: EM | Admit: 2021-11-06 | Discharge: 2021-11-06 | Disposition: A | Discharge status: Home / Self Care | Payer: Medicare Other

## 2021-11-06 ENCOUNTER — Telehealth: Payer: Self-pay | Admitting: *Deleted

## 2021-11-06 ENCOUNTER — Emergency Department (HOSPITAL_BASED_OUTPATIENT_CLINIC_OR_DEPARTMENT_OTHER): Payer: Medicare Other

## 2021-11-06 DIAGNOSIS — N3090 Cystitis, unspecified without hematuria: Secondary | ICD-10-CM | POA: Insufficient documentation

## 2021-11-06 DIAGNOSIS — R300 Dysuria: Secondary | ICD-10-CM

## 2021-11-06 DIAGNOSIS — Z79899 Other long term (current) drug therapy: Secondary | ICD-10-CM | POA: Diagnosis not present

## 2021-11-06 DIAGNOSIS — K5900 Constipation, unspecified: Secondary | ICD-10-CM | POA: Diagnosis not present

## 2021-11-06 DIAGNOSIS — Z7982 Long term (current) use of aspirin: Secondary | ICD-10-CM | POA: Insufficient documentation

## 2021-11-06 DIAGNOSIS — K4090 Unilateral inguinal hernia, without obstruction or gangrene, not specified as recurrent: Secondary | ICD-10-CM | POA: Diagnosis not present

## 2021-11-06 DIAGNOSIS — R030 Elevated blood-pressure reading, without diagnosis of hypertension: Secondary | ICD-10-CM

## 2021-11-06 DIAGNOSIS — R1032 Left lower quadrant pain: Secondary | ICD-10-CM | POA: Diagnosis not present

## 2021-11-06 DIAGNOSIS — N2889 Other specified disorders of kidney and ureter: Secondary | ICD-10-CM | POA: Diagnosis not present

## 2021-11-06 DIAGNOSIS — K5730 Diverticulosis of large intestine without perforation or abscess without bleeding: Secondary | ICD-10-CM | POA: Diagnosis not present

## 2021-11-06 LAB — CBC
HCT: 41.8 % (ref 36.0–46.0)
Hemoglobin: 13.9 g/dL (ref 12.0–15.0)
MCH: 30.4 pg (ref 26.0–34.0)
MCHC: 33.3 g/dL (ref 30.0–36.0)
MCV: 91.5 fL (ref 80.0–100.0)
Platelets: 246 10*3/uL (ref 150–400)
RBC: 4.57 MIL/uL (ref 3.87–5.11)
RDW: 13.1 % (ref 11.5–15.5)
WBC: 8.1 10*3/uL (ref 4.0–10.5)
nRBC: 0 % (ref 0.0–0.2)

## 2021-11-06 LAB — URINALYSIS, ROUTINE W REFLEX MICROSCOPIC
Bilirubin Urine: NEGATIVE
Glucose, UA: NEGATIVE mg/dL
Hgb urine dipstick: NEGATIVE
Ketones, ur: NEGATIVE mg/dL
Nitrite: NEGATIVE
Protein, ur: NEGATIVE mg/dL
Specific Gravity, Urine: 1.01 (ref 1.005–1.030)
pH: 5.5 (ref 5.0–8.0)

## 2021-11-06 LAB — COMPREHENSIVE METABOLIC PANEL
ALT: 9 U/L (ref 0–44)
AST: 17 U/L (ref 15–41)
Albumin: 4.1 g/dL (ref 3.5–5.0)
Alkaline Phosphatase: 49 U/L (ref 38–126)
Anion gap: 9 (ref 5–15)
BUN: 30 mg/dL — ABNORMAL HIGH (ref 8–23)
CO2: 27 mmol/L (ref 22–32)
Calcium: 9.9 mg/dL (ref 8.9–10.3)
Chloride: 99 mmol/L (ref 98–111)
Creatinine, Ser: 1.04 mg/dL — ABNORMAL HIGH (ref 0.44–1.00)
GFR, Estimated: 50 mL/min — ABNORMAL LOW (ref >60)
Glucose, Bld: 100 mg/dL — ABNORMAL HIGH (ref 70–99)
Potassium: 4.4 mmol/L (ref 3.5–5.1)
Sodium: 135 mmol/L (ref 135–145)
Total Bilirubin: 0.4 mg/dL (ref 0.3–1.2)
Total Protein: 7.4 g/dL (ref 6.5–8.1)

## 2021-11-06 LAB — LIPASE, BLOOD: Lipase: 20 U/L (ref 11–51)

## 2021-11-06 MED ORDER — CEPHALEXIN 500 MG PO CAPS: 500.0000 mg | ORAL_CAPSULE | Freq: Three times a day (TID) | ORAL | 0 refills | Status: DC

## 2021-11-06 MED ORDER — PHENAZOPYRIDINE HCL 100 MG PO TABS
95.0000 mg | ORAL_TABLET | Freq: Once | ORAL | Status: AC
  Administered 2021-11-06: 100 mg via ORAL
  Filled 2021-11-06: qty 1

## 2021-11-06 MED ORDER — ACETAMINOPHEN 325 MG PO TABS
650.0000 mg | ORAL_TABLET | Freq: Once | ORAL | Status: AC
  Administered 2021-11-06: 650 mg via ORAL
  Filled 2021-11-06: qty 2

## 2021-11-06 MED ORDER — POLYETHYLENE GLYCOL 3350 17 G PO PACK: 17.0000 g | PACK | Freq: Every day | ORAL | 0 refills | Status: AC

## 2021-11-06 MED ORDER — CEPHALEXIN 250 MG PO CAPS
500.0000 mg | ORAL_CAPSULE | Freq: Once | ORAL | Status: AC
Start: 2021-11-06 — End: 2021-11-06
  Administered 2021-11-06: 500 mg via ORAL
  Filled 2021-11-06: qty 2

## 2021-11-06 MED ORDER — PHENAZOPYRIDINE HCL 200 MG PO TABS: 200.0000 mg | ORAL_TABLET | Freq: Three times a day (TID) | ORAL | 0 refills | Status: DC

## 2021-11-06 NOTE — ED Triage Notes (Signed)
She c/o left-sided abd. Flank and abd. Pain "off and on a little bit for a long time now, but it's got worse lately" [sic]. She is ambulatory and in no distress. She denies fever/n/v/d.

## 2021-11-06 NOTE — Discharge Instructions (Signed)
Please go to the emergency department as soon as you leave urgent care for further evaluation and management. ?

## 2021-11-06 NOTE — ED Provider Notes (Signed)
[facility] DEPT Provider Note   CSN: [id number] Arrival date & time: 11/06/21  1519     History  Chief Complaint  Patient presents with   Abdominal Pain    [name] is a 88 y.o. female.  HPI     88 year old female comes in with chief complaint of abdominal pain. Pain she has history of gastritis, hyperlipidemia and GERD.  No history of kidney stones.  She indicates that she has been having some lower abdominal pain on the left side for the last several weeks.  Over the last week her symptoms have become more severe.  Left-sided pain is described as mild to moderate in nature, with waxing and waning intensity.  The pain is intermittent with no specific evoking, aggravating or relieving factors.  Patient is having some burning with urination.  There is no history of kidney stone.  She reports normal BM.   Home Medications Prior to Admission medications   Medication Sig Start Date End Date Taking? Authorizing Provider  cephALEXin (KEFLEX) 500 MG capsule Take 1 capsule (500 mg total) by mouth 3 (three) times daily. 11/06/21  Yes Varney Biles, MD  phenazopyridine (PYRIDIUM) 200 MG tablet Take 1 tablet (200 mg total) by mouth 3 (three) times daily. 11/06/21  Yes Swayze Pries, MD  polyethylene glycol (MIRALAX / GLYCOLAX) 17 g packet Take 17 g by mouth daily. 11/06/21  Yes Varney Biles, MD  aspirin 81 MG tablet Take 81 mg by mouth daily.    [provider]  colchicine 0.6 MG tablet 1 tab po x 1 then repeat in 2 hours, continue daily until flare resolved. 03/22/19   Bedsole, Amy E, MD  fish oil-omega-3 fatty acids 1000 MG capsule Take 2 g by mouth 2 (two) times daily.     [provider]  Glucosamine Sulfate 500 MG CAPS Take 500 mg by mouth 2 (two) times daily.    [provider]  hydroxypropyl methylcellulose (ISOPTO TEARS) 2.5 % ophthalmic solution Place 1 drop into both eyes 3 (three) times daily as needed (dry eyes).     [provider]  lisinopril-hydrochlorothiazide (ZESTORETIC) 20-12.5 MG tablet TAKE 2 TABLETS BY MOUTH  DAILY 10/22/21   Bedsole, Amy E, MD  metoprolol tartrate (LOPRESSOR) 25 MG tablet TAKE 1 TABLET BY MOUTH  TWICE DAILY (TAKE WITH 50  MG TABLET FOR A TOTAL OF 75 MG TWICE DAILY) 05/24/21   Bedsole, Amy E, MD  metoprolol tartrate (LOPRESSOR) 50 MG tablet TAKE 1 TABLET BY MOUTH  TWICE DAILY (TAKE WITH 25  MG FOR A TOTAL OF 75 MG  TWICE DAILY) 05/24/21   Bedsole, Amy E, MD  Multiple Vitamin (MULTIVITAMIN) tablet Take 1 tablet by mouth daily.    [provider]  mupirocin ointment (BACTROBAN) 2 % Apply 1 application topically 3 (three) times daily. 10/31/21   Moye, [place], MD  neomycin-polymyxin b-dexamethasone (MAXITROL) 3.5-10000-0.1 OINT  03/22/21   [provider]  omeprazole (PRILOSEC) 40 MG capsule TAKE 1 CAPSULE BY MOUTH  DAILY 10/22/21   Bedsole, Amy E, MD  OVER THE COUNTER MEDICATION I caps 1 tab bid    [provider]  sertraline (ZOLOFT) 50 MG tablet TAKE 1 TABLET BY MOUTH  DAILY AT BEDTIME 10/22/21   Bedsole, Amy E, MD      Allergies    Patient has no known allergies.    Review of Systems   Review of Systems  Constitutional:  Positive for activity change.  Gastrointestinal:  Positive  for abdominal pain.  Genitourinary:  Positive for dysuria and flank pain.   Physical Exam Updated Vital Signs BP (!) 150/90 (BP Location: Right Arm)    Pulse 100    Temp 98.3 F (36.8 C)    Resp 20    SpO2 98%  Physical Exam Vitals and nursing note reviewed.  Constitutional:      Appearance: She is well-developed.  HENT:     Head: Atraumatic.  Cardiovascular:     Rate and Rhythm: Normal rate.  Pulmonary:     Effort: Pulmonary effort is normal.  Abdominal:     Tenderness: There is abdominal tenderness in the suprapubic area and left lower quadrant. There is no guarding or rebound.  Musculoskeletal:     Cervical back: Normal range of motion and neck supple.  Skin:     General: Skin is warm and dry.  Neurological:     Mental Status: She is alert and oriented to person, place, and time.    ED Results / Procedures / Treatments   Labs (all labs ordered are listed, but only abnormal results are displayed) Labs Reviewed  COMPREHENSIVE METABOLIC PANEL - Abnormal; Notable for the following components:      Result Value   Glucose, Bld 100 (*)    BUN 30 (*)    Creatinine, Ser 1.04 (*)    GFR, Estimated 50 (*)    All other components within normal limits  URINALYSIS, ROUTINE W REFLEX MICROSCOPIC - Abnormal; Notable for the following components:   APPearance HAZY (*)    Leukocytes,Ua TRACE (*)    Bacteria, UA FEW (*)    All other components within normal limits  LIPASE, BLOOD  CBC    EKG None  Radiology CT Renal Stone Study  Result Date: 11/06/2021 CLINICAL DATA:  Flank pain, kidney stone suspected. Intermittent left flank pain for a long time, worsening recently. History of erosive esophagitis, gastritis and appendectomy. EXAM: CT ABDOMEN AND PELVIS WITHOUT CONTRAST TECHNIQUE: Multidetector CT imaging of the abdomen and pelvis was performed following the standard protocol without IV contrast. RADIATION DOSE REDUCTION: This exam was performed according to the departmental dose-optimization program which includes automated exposure control, adjustment of the mA and/or kV according to patient size and/or use of iterative reconstruction technique. COMPARISON:  Limited correlation made with chest CT 12/04/2010. FINDINGS: Lower chest: A moderate size hiatal hernia has enlarged compared with the prior chest CT. There is mild atherosclerosis of the aorta and coronary arteries. No significant pleural or pericardial effusion. Mild dependent atelectasis or scarring at both lung bases. Hepatobiliary: The liver appears unremarkable as imaged in the noncontrast state. No evidence of gallstones, gallbladder wall thickening or biliary dilatation. Pancreas: Unremarkable. No  pancreatic ductal dilatation or surrounding inflammatory changes. Spleen: Normal in size without focal abnormality. Adrenals/Urinary Tract: Both adrenal glands appear normal. Both renal pelves are mildly dilated. The ureters are normal in caliber, and there is no perinephric soft tissue stranding or urinary tract calculus. 6 mm exophytic lesion from the interpolar region of the left kidney measures near water density is probably a small cyst. The kidneys were not previously imaged on prior chest CTs. The bladder appears unremarkable. Stomach/Bowel: No enteric contrast administered. As above, enlarging hiatal hernia. The remainder of the visualized stomach appears unremarkable. No evidence of bowel wall thickening, distention or surrounding inflammation. There is a small periampullary duodenal diverticulum. Small bowel extends into a right lateral inguinal hernia which is probably a femoral hernia. No evidence of incarceration or  obstruction. There is moderate stool throughout the colon. Diverticular changes are present within the sigmoid colon without surrounding inflammation. Vascular/Lymphatic: There are no enlarged abdominal or pelvic lymph nodes. Mild aortic and branch vessel atherosclerosis. No evidence of aneurysm. Reproductive: The uterus and ovaries appear unremarkable. No adnexal mass. Other: Small right inguinal hernia containing fat. As above, a small amount of herniated small bowel lateral to this inguinal hernia is likely within a small femoral hernia. No ascites, free air or focal extraluminal fluid collection. Musculoskeletal: Diffuse degenerative changes throughout the lumbar spine associated with a convex left scoliosis. No acute osseous findings. IMPRESSION: 1. No evidence of urinary tract calculus or hydronephrosis. Probable bilateral extrarenal pelves without secondary signs of obstruction. 2. No clear explanation for left flank pain. There is moderate stool throughout the colon consistent with  constipation. 3. Small probable right femoral hernia containing a knuckle of distal small bowel. No evidence of incarceration or obstruction. Small adjacent inguinal hernia containing fat. 4. Enlarging hiatal hernia, now moderate in size. 5. Coronary and Aortic Atherosclerosis (ICD10-I70.0). Electronically Signed   By: Richardean Sale M.D.   On: 11/06/2021 16:47    Procedures Procedures    Medications Ordered in ED Medications  acetaminophen (TYLENOL) tablet 650 mg (650 mg Oral Given 11/06/21 1625)  cephALEXin (KEFLEX) capsule 500 mg (500 mg Oral Given 11/06/21 1739)  phenazopyridine (PYRIDIUM) tablet 100 mg (100 mg Oral Given 11/06/21 1739)    ED Course/ Medical Decision Making/ A&P                           Medical Decision Making 88 year old female with history of gastritis comes in with chief complaint of abdominal pain  It appears that she has been having left-sided abdominal pain for the last several weeks, more recently the pain has become more severe and more frequent.  The pain is still not constant.  Today she was in a lot of discomfort, prompting her daughter to bring her to the ER.  On exam patient does have left lower quadrant tenderness.  Differential diagnosis includes diverticulitis, colitis, constipation, kidney stone, pyelonephritis, perinephric abscess.  We ordered basic blood work-up, urine analysis and a CT scan.  Problems Addressed: Constipation, unspecified constipation type: self-limited or minor problem Cystitis: acute illness or injury with systemic symptoms LLQ abdominal pain: complicated acute illness or injury  Amount and/or Complexity of Data Reviewed Independent Historian: caregiver    Details: One of her daughters External Data Reviewed: labs and radiology.    Details: Did not see any recent CT scan concerning for diverticulitis. Labs: ordered. Decision-making details documented in ED Course.    Details: Renal function, urine analysis, CBC reviewed.   UA has mild pyuria and leukocyte positive with rare bacteria. Radiology: ordered. ECG/medicine tests: ordered.  Risk OTC drugs. Prescription drug management. Decision regarding hospitalization. Risk Details: Considered admission, however CT scan is normal.  Patient has passed oral challenge.  She lives by herself, but has good support system.           Final Clinical Impression(s) / ED Diagnoses Final diagnoses:  LLQ abdominal pain  Cystitis  Constipation, unspecified constipation type    Rx / DC Orders ED Discharge Orders          Ordered    cephALEXin (KEFLEX) 500 MG capsule  3 times daily        11/06/21 1801    phenazopyridine (PYRIDIUM) 200 MG tablet  3 times daily  11/06/21 1801    polyethylene glycol (MIRALAX / GLYCOLAX) 17 g packet  Daily        11/06/21 1803              Varney Biles, MD 11/06/21 1818

## 2021-11-06 NOTE — Telephone Encounter (Signed)
Spoke to patient's daughter Thayer Headings on [place]. Thayer Headings stated that her mom has been having left side pain that goes around into her back off and on for over a week. Thayer Headings stated that her mom's pain level this morning was about 7.5. Thayer Headings stated that the access nurse told her that she would need to take her to the ER. Thayer Headings stated that her mom will not go to the ER. Thayer Headings stated that her mom has complained of some burning with urination. Thayer Headings stated that she tried to find the [place] office previously and ended up in [place]. Thayer Headings was given information on the [facility] at [facility]. Thayer Headings stated that she thinks that her mom will agree for her to take her to the UC. Thayer Headings stated that she will plan on taking her mom to the UC within the next hour.

## 2021-11-06 NOTE — ED Notes (Signed)
Daughter signed for the Pt during discharge.

## 2021-11-06 NOTE — Discharge Instructions (Addendum)
We saw you in the ER for abdominal pain and burning with urination. All the results in the ER are normal, labs and imaging.  There is evidence of left-sided kidney cyst.  No evidence of any kidney stones  Given that you have some burning with urination, we suspect that you have a bladder infection.  Take the antibiotics prescribed and follow-up with your doctor in 1 week.  There is also evidence of constipation on your CT scan, take stool softeners for the next 3 to 5 days.

## 2021-11-06 NOTE — ED Triage Notes (Signed)
Pt c/o LLQ abd pain that radiates to back. Sometimes dysuria.   Denies hematuria, frequency, urgency, oliguria,   Onset ~ over 1 week   Not best medication historian.

## 2021-11-06 NOTE — Telephone Encounter (Signed)
PLEASE NOTE: All timestamps contained within this report are represented as [place] Standard Time. CONFIDENTIALTY NOTICE: This fax transmission is intended only for the addressee. It contains information that is legally privileged, confidential or otherwise protected from use or disclosure. If you are not the intended recipient, you are strictly prohibited from reviewing, disclosing, copying using or disseminating any of this information or taking any action in reliance on or regarding this information. If you have received this fax in error, please notify us immediately by telephone so that we can arrange for its return to [place]. Phone: [phone], Toll-Free: [phone], Fax: [phone] Page: 1 of 2 Call Id: [id number] [place] [facility] - Client TELEPHONE ADVICE RECORD AccessNurse Patient Name: [name] NON Gender: Female DOB: 01-05-1925 Age: 88 Y 1 M 7 D Return Phone Number: [phone] (Primary), [phone] (Secondary) Address: City/ [place]/ [place] [address] Client [facility] - Client Client Site [place] - Day Provider Eliezer Lofts - MD Contact Type Call Who Is Calling Patient / Member / Family / Caregiver Call Type Triage / Clinical Caller Name Darnette Lampron Relationship To Patient Daughter Return Phone Number [phone] (Primary) Chief Complaint Back Pain - General Reason for Call Symptomatic / Request for Health Information Initial Comment Caller states left side leg and back pain. Translation No Nurse Assessment Nurse: Noralee Stain, RN, Christina Date/Time (Eastern Time): 11/06/2021 11:11:27 AM Confirm and document reason for call. If symptomatic, describe symptoms. ---Caller states lower left back pain that wraps around to her abdomen for about a week. Pt has been taking Tylenol, but this is not helping. No known any injury or falls. Burning when she urinates. Denies any other symptoms  at this time. Does the patient have any new or worsening symptoms? ---Yes Will a triage be completed? ---Yes Related visit to physician within the last 2 weeks? ---No Does the PT have any chronic conditions? (i.e. diabetes, asthma, this includes High risk factors for pregnancy, etc.) ---Yes List chronic conditions. ---high blood pressure, acid reflux, Is this a behavioral health or substance abuse call? ---No Guidelines Guideline Title Affirmed Question Affirmed Notes Nurse Date/Time (Eastern Time) Flank Pain [1] SEVERE pain (e.g., excruciating, scale 8-10) AND [2] present > 1 hour Noralee Stain, RN, Christina 11/06/2021 11:14:47 AM Disp. Time Eilene Ghazi Time) Disposition Final User 11/06/2021 11:21:05 AM Go to ED Now Yes Noralee Stain, RN, Christina PLEASE NOTE: All timestamps contained within this report are represented as [place] Standard Time. CONFIDENTIALTY NOTICE: This fax transmission is intended only for the addressee. It contains information that is legally privileged, confidential or otherwise protected from use or disclosure. If you are not the intended recipient, you are strictly prohibited from reviewing, disclosing, copying using or disseminating any of this information or taking any action in reliance on or regarding this information. If you have received this fax in error, please notify us immediately by telephone so that we can arrange for its return to [place]. Phone: [phone], Toll-Free: [phone], Fax: [phone] Page: 2 of 2 Call Id: [id number] [place] Disagree/Comply Disagree Caller Understands Yes PreDisposition Call Doctor Care Advice Given Per Guideline GO TO ED NOW: * You need to be seen in the Emergency Department. * Go to the ED at ___________ [place] now. Drive carefully. ANOTHER ADULT SHOULD DRIVE: * It is better and safer if another adult drives instead of you. CARE ADVICE given per Flank Pain (Adult) guideline. BRING MEDICINES: * Bring a list of your current  medicines  when you go to the Emergency Department (ER). * Bring the pill bottles too. This will help the doctor (or NP/PA) to make certain you are taking the right medicines and the right dose. Comments User: Aletta Edouard, RN Date/Time Eilene Ghazi Time): 11/06/2021 11:15:59 AM Pain 08/10 User: Aletta Edouard, RN Date/Time Eilene Ghazi Time): 11/06/2021 11:22:59 AM Pt and caller verbalized understanding of ED outcome but patient does not want to go to the ED. This RN stressed the importance of being seen in the ED due to the severity of her symptoms. Caller states she will try to take the patient to the ED or will call back for an appointment. Referrals [facility] - ED

## 2021-11-06 NOTE — ED Provider Notes (Signed)
[facility]    CSN: [id number] Arrival date & time: 11/06/21  1330      History   Chief Complaint Chief Complaint  Patient presents with   Abdominal Pain    HPI Jericho Cieslik is a 88 y.o. female.   Patient presents with left lower quadrant pain with associated mild dysuria that has been present for approximately 1 week.  She reports that the left lower abdominal pain radiates around to left lower back.  Denies any known fevers.  Denies urinary frequency, hematuria, vaginal discharge, pelvic pain.  Patient denies any current abdominal pain but reports that it was present this morning, and it was rated 8 or 9 on 1-10 pain scale.  Daughter who is present in urgent care today reports that it appeared that she was in severe pain this morning.  Denies any injury to the area.  Patient is having normal bowel movements.  Denies nausea or vomiting.  No blood in stool.  Denies chest pain or shortness of breath.  PCP was called this morning and they were advised to go to ER.  Patient has significantly elevated blood pressure today as well.  She reports that she is taking all of her medications as prescribed.  They do take blood pressure at home and it  is typically 458 systolic but has not taken it in approximately 1 week.   Abdominal Pain  Past Medical History:  Diagnosis Date   Erosive esophagitis    Gastric ulcer    Gastritis    GERD (gastroesophageal reflux disease)    HLD (hyperlipidemia)    HTN (hypertension)    Osteoarthritis    Osteopenia    Prediabetes    pt does not check cbg at home   Squamous cell carcinoma of skin 09/21/2019   Mid vertex scalp  Squamous Cell Carcinoma in Situ   Squamous cell carcinoma of skin 08/30/2020   mid vertex scalp  SCCis     Patient Active Problem List   Diagnosis Date Noted   COVID-19 07/11/2021   Aortic atherosclerosis ([place]) 03/26/2021   Osteoarthritis, hand 12/30/2019   Bradycardia 09/06/2019   Vitamin D deficiency  09/10/2018   Gout flare 07/08/2018   Dyspnea on exertion 04/03/2017   Depression, major, single episode, mild ([place]) 04/03/2017   CKD (chronic kidney disease) stage 3, GFR 30-59 ml/min ([facility]) 06/20/2016   Low back pain 04/30/2014   Constipation 06/06/2013   Overweight (BMI 25.0-29.9) 05/31/2013   S/P left TK revision 05/30/2013   GASTRITIS 02/05/2010   Osteopenia 06/28/2009   COMPUTERIZED TOMOGRAPHY, CHEST, ABNORMAL 06/21/2009   Prediabetes 11/06/2008   Erosive esophagitis 11/12/2007   PUD 11/12/2007   GERD 08/20/2007   OSTEOARTHRITIS 08/20/2007   Hyperlipidemia 08/11/2007   HYPERTENSION, BENIGN 07/30/2007    Past Surgical History:  Procedure Laterality Date   APPENDECTOMY  yrs ago   arthroscopic knee surgery Left yrs ago   benign nodule removed from neck  05-21-2013   EYE SURGERY Bilateral 9- 9-19-2011and 07-2010   cataract lens replacement   KNEE SURGERY Left 05/2009   to cut nerves   left knee replacement  09-30-2005   nuclear cardiolyte  10/08   right knee replacement Right 08-10-2013   right shoulder surgery  2005   TOTAL KNEE REVISION Left 05/30/2013   Procedure: LEFT TOTAL KNEE REVISION;  Surgeon: Mauri Pole, MD;  Location: [facility];  Service: Orthopedics;  Laterality: Left;    OB History   No obstetric history on file.  Home Medications    Prior to Admission medications   Medication Sig Start Date End Date Taking? Authorizing Provider  aspirin 81 MG tablet Take 81 mg by mouth daily.    [provider]  colchicine 0.6 MG tablet 1 tab po x 1 then repeat in 2 hours, continue daily until flare resolved. 03/22/19   Bedsole, Amy E, MD  fish oil-omega-3 fatty acids 1000 MG capsule Take 2 g by mouth 2 (two) times daily.     [provider]  Glucosamine Sulfate 500 MG CAPS Take 500 mg by mouth 2 (two) times daily.    [provider]  hydroxypropyl methylcellulose (ISOPTO TEARS) 2.5 % ophthalmic solution Place 1 drop into both eyes 3 (three)  times daily as needed (dry eyes).    [provider]  lisinopril-hydrochlorothiazide (ZESTORETIC) 20-12.5 MG tablet TAKE 2 TABLETS BY MOUTH  DAILY 10/22/21   Bedsole, Amy E, MD  metoprolol tartrate (LOPRESSOR) 25 MG tablet TAKE 1 TABLET BY MOUTH  TWICE DAILY (TAKE WITH 50  MG TABLET FOR A TOTAL OF 75 MG TWICE DAILY) 05/24/21   Bedsole, Amy E, MD  metoprolol tartrate (LOPRESSOR) 50 MG tablet TAKE 1 TABLET BY MOUTH  TWICE DAILY (TAKE WITH 25  MG FOR A TOTAL OF 75 MG  TWICE DAILY) 05/24/21   Bedsole, Amy E, MD  Multiple Vitamin (MULTIVITAMIN) tablet Take 1 tablet by mouth daily.    [provider]  mupirocin ointment (BACTROBAN) 2 % Apply 1 application topically 3 (three) times daily. 10/31/21   Moye, [place], MD  neomycin-polymyxin b-dexamethasone (MAXITROL) 3.5-10000-0.1 OINT  03/22/21   [provider]  omeprazole (PRILOSEC) 40 MG capsule TAKE 1 CAPSULE BY MOUTH  DAILY 10/22/21   Jinny Sanders, MD  OVER THE COUNTER MEDICATION I caps 1 tab bid    [provider]  sertraline (ZOLOFT) 50 MG tablet TAKE 1 TABLET BY MOUTH  DAILY AT BEDTIME 10/22/21   Bedsole, Amy E, MD    Family History Family History  Problem Relation Age of Onset   Coronary artery disease Father 13   Hypertension Sister        x 4   Breast cancer Sister 82   Breast cancer Daughter 5    Social History Social History   Tobacco Use   Smoking status: Never   Smokeless tobacco: Never  Vaping Use   Vaping Use: Never used  Substance Use Topics   Alcohol use: No    Alcohol/week: 0.0 standard drinks   Drug use: No     Allergies   Patient has no known allergies.   Review of Systems Review of Systems Per HPI  Physical Exam Triage Vital Signs ED Triage Vitals [11/06/21 1426]  Enc Vitals Group     BP (!) 189/79     Pulse Rate 63     Resp 18     Temp (!) 97.5 F (36.4 C)     Temp Source Oral     SpO2 97 %     Weight      Height      Head Circumference      Peak Flow      Pain Score  0     Pain Loc      Pain Edu?      Excl. in [place]?    No data found.  Updated Vital Signs BP (!) 189/79 (BP Location: Left Arm)    Pulse 63    Temp (!) 97.5 F (  36.4 C) (Oral)    Resp 18    SpO2 97%   Visual Acuity Right Eye Distance:   Left Eye Distance:   Bilateral Distance:    Right Eye Near:   Left Eye Near:    Bilateral Near:     Physical Exam Constitutional:      General: She is not in acute distress.    Appearance: Normal appearance. She is not toxic-appearing or diaphoretic.  HENT:     Head: Normocephalic and atraumatic.  Eyes:     Extraocular Movements: Extraocular movements intact.     Conjunctiva/sclera: Conjunctivae normal.  Cardiovascular:     Rate and Rhythm: Normal rate and regular rhythm.     Pulses: Normal pulses.     Heart sounds: Normal heart sounds.  Pulmonary:     Effort: Pulmonary effort is normal. No respiratory distress.     Breath sounds: Normal breath sounds.  Abdominal:     General: Bowel sounds are normal. There is no distension.     Palpations: Abdomen is soft.     Tenderness: There is no abdominal tenderness.  Musculoskeletal:     Cervical back: Normal.     Thoracic back: Normal.     Lumbar back: Tenderness present. No swelling, edema, deformity or bony tenderness. Negative right straight leg raise test and negative left straight leg raise test.       Back:  Skin:    General: Skin is warm and dry.  Neurological:     General: No focal deficit present.     Mental Status: She is alert and oriented to person, place, and time. Mental status is at baseline.  Psychiatric:        Mood and Affect: Mood normal.        Behavior: Behavior normal.        Thought Content: Thought content normal.        Judgment: Judgment normal.     UC Treatments / Results  Labs (all labs ordered are listed, but only abnormal results are displayed) Labs Reviewed - No data to display  EKG   Radiology No results found.  Procedures Procedures (including  critical care time)  Medications Ordered in UC Medications - No data to display  Initial Impression / Assessment and Plan / UC Course  I have reviewed the triage vital signs and the nursing notes.  Pertinent labs & imaging results that were available during my care of the patient were reviewed by me and considered in my medical decision making (see chart for details).     It is highly likely that patient's symptoms are urinary tract infection related.  Although, patient is not able to provide urine sample in urgent care today.  Offered patient urinalysis evaluation several times in urgent care but patient declined.  Patient was advised that she needs further evaluation of abdominal pain in order to provide treatment.  In the setting of elevated blood pressure, severe abdominal pain, patient's age, dysuria, patient was advised that it would best for her to go to the hospital for further evaluation and management. Do not have any additional testing methods in urgent care for severe abdominal pain. Patient and daughter were agreeable with plan.  Vital signs stable at discharge.  Agree with patient self transport to the hospital. Final Clinical Impressions(s) / UC Diagnoses   Final diagnoses:  Left lower quadrant abdominal pain  Dysuria  Elevated blood pressure reading     Discharge Instructions  Please go to the emergency department as soon as you leave urgent care for further evaluation and management.    ED Prescriptions   None    PDMP not reviewed this encounter.   Teodora Medici, [place] 11/06/21 1450

## 2021-11-07 NOTE — Telephone Encounter (Signed)
Noted.. agree with UC visit for possible UTI

## 2021-11-08 LAB — ANAEROBIC AND AEROBIC CULTURE

## 2021-11-10 ENCOUNTER — Encounter: Payer: Self-pay | Admitting: Dermatology

## 2021-11-10 ENCOUNTER — Other Ambulatory Visit: Payer: Self-pay | Admitting: Dermatology

## 2021-11-12 ENCOUNTER — Other Ambulatory Visit: Payer: Self-pay

## 2021-11-12 ENCOUNTER — Telehealth: Payer: Self-pay

## 2021-11-12 ENCOUNTER — Ambulatory Visit (INDEPENDENT_AMBULATORY_CARE_PROVIDER_SITE_OTHER): Payer: Medicare Other | Admitting: Family Medicine

## 2021-11-12 ENCOUNTER — Encounter: Payer: Self-pay | Admitting: Family Medicine

## 2021-11-12 VITALS — BP 160/70 | HR 61 | Temp 97.6°F | Ht 63.25 in | Wt 157.0 lb

## 2021-11-12 DIAGNOSIS — K5909 Other constipation: Secondary | ICD-10-CM | POA: Diagnosis not present

## 2021-11-12 DIAGNOSIS — T753XXA Motion sickness, initial encounter: Secondary | ICD-10-CM | POA: Insufficient documentation

## 2021-11-12 DIAGNOSIS — N390 Urinary tract infection, site not specified: Secondary | ICD-10-CM

## 2021-11-12 MED ORDER — SCOPOLAMINE 1 MG/3DAYS TD PT72: 1.0000 | MEDICATED_PATCH | TRANSDERMAL | 0 refills | Status: DC

## 2021-11-12 NOTE — Progress Notes (Signed)
Patient ID: [name], female    DOB: 1924/10/28, 88 y.o.   MRN: [id number]  This visit was conducted in person.  BP (!) 160/70    Pulse 61    Temp 97.6 F (36.4 C) (Temporal)    Ht 5' 3.25" (1.607 m)    Wt 157 lb (71.2 kg)    SpO2 96%    BMI 27.59 kg/m    CC:  Chief Complaint  Patient presents with   Follow-up    ED Visit-Abd Pain    Subjective:   HPI: [name] is a 88 y.o. female  with history of gastritis, GERDpresenting on 11/12/2021 for Follow-up (ED Visit-Abd Pain)  ED visit for abdominal pain, left sided, ongoing x weeks. [place] became more severe  CT scan: no diverticulitis.. showed constipation. Reviewed note in detail. Treated for UTI with keflex, pyridium  Constipation treated with miralax 17 g daily.   Today she reports no LLQ pain, more in left lower back now, points to left buttock/sciatic notch  No further dysuria, no current frequency and urgency.  No SE to antibiotics.  No drinking much water,  Now BM  daily.  No fever.   No fiber supplement.   Going on cruise.. may get sea sick.. cannot remember.    Relevant past medical, surgical, family and social history reviewed and updated as indicated. Interim medical history since our last visit reviewed. Allergies and medications reviewed and updated. Outpatient Medications Prior to Visit  Medication Sig Dispense Refill   aspirin 81 MG tablet Take 81 mg by mouth daily.     cephALEXin (KEFLEX) 500 MG capsule Take 1 capsule (500 mg total) by mouth 3 (three) times daily. 26 capsule 0   colchicine 0.6 MG tablet 1 tab po x 1 then repeat in 2 hours, continue daily until flare resolved. 30 tablet 1   fish oil-omega-3 fatty acids 1000 MG capsule Take 2 g by mouth 2 (two) times daily.      Glucosamine Sulfate 500 MG CAPS Take 500 mg by mouth 2 (two) times daily.     hydroxypropyl methylcellulose (ISOPTO TEARS) 2.5 % ophthalmic solution Place 1 drop into both eyes 3 (three) times daily as needed  (dry eyes).     lisinopril-hydrochlorothiazide (ZESTORETIC) 20-12.5 MG tablet TAKE 2 TABLETS BY MOUTH  DAILY 180 tablet 0   metoprolol tartrate (LOPRESSOR) 25 MG tablet TAKE 1 TABLET BY MOUTH  TWICE DAILY (TAKE WITH 50  MG TABLET FOR A TOTAL OF 75 MG TWICE DAILY) 180 tablet 3   metoprolol tartrate (LOPRESSOR) 50 MG tablet TAKE 1 TABLET BY MOUTH  TWICE DAILY (TAKE WITH 25  MG FOR A TOTAL OF 75 MG  TWICE DAILY) 180 tablet 3   Multiple Vitamin (MULTIVITAMIN) tablet Take 1 tablet by mouth daily.     mupirocin ointment (BACTROBAN) 2 % Apply 1 application topically 3 (three) times daily. 22 g 0   neomycin-polymyxin b-dexamethasone (MAXITROL) 3.5-10000-0.1 OINT      omeprazole (PRILOSEC) 40 MG capsule TAKE 1 CAPSULE BY MOUTH  DAILY 90 capsule 0   OVER THE COUNTER MEDICATION I caps 1 tab bid     polyethylene glycol (MIRALAX / GLYCOLAX) 17 g packet Take 17 g by mouth daily. 5 each 0   sertraline (ZOLOFT) 50 MG tablet TAKE 1 TABLET BY MOUTH  DAILY AT BEDTIME 90 tablet 0   phenazopyridine (PYRIDIUM) 200 MG tablet Take 1 tablet (200 mg total) by mouth 3 (three) times daily. 6  tablet 0   No facility-administered medications prior to visit.     Per HPI unless specifically indicated in ROS section below Review of Systems  Constitutional:  Negative for fatigue and fever.  HENT:  Negative for congestion and ear pain.   Eyes:  Negative for pain.  Respiratory:  Negative for cough, chest tightness and shortness of breath.   Cardiovascular:  Negative for chest pain, palpitations and leg swelling.  Gastrointestinal:  Negative for abdominal pain.  Genitourinary:  Negative for dysuria and vaginal bleeding.  Musculoskeletal:  Negative for back pain.  Neurological:  Negative for syncope, light-headedness and headaches.  Psychiatric/Behavioral:  Negative for dysphoric mood.   Objective:  BP (!) 160/70    Pulse 61    Temp 97.6 F (36.4 C) (Temporal)    Ht 5' 3.25" (1.607 m)    Wt 157 lb (71.2 kg)    SpO2 96%     BMI 27.59 kg/m   Wt Readings from Last 3 Encounters:  11/12/21 157 lb (71.2 kg)  09/25/21 163 lb (73.9 kg)  03/26/21 163 lb 8 oz (74.2 kg)      Physical Exam Constitutional:      General: She is not in acute distress.    Appearance: Normal appearance. She is well-developed. She is not ill-appearing or toxic-appearing.  HENT:     Head: Normocephalic.     Right Ear: Hearing, tympanic membrane, ear canal and external ear normal. Tympanic membrane is not erythematous, retracted or bulging.     Left Ear: Hearing, tympanic membrane, ear canal and external ear normal. Tympanic membrane is not erythematous, retracted or bulging.     Nose: No mucosal edema or rhinorrhea.     Right Sinus: No maxillary sinus tenderness or frontal sinus tenderness.     Left Sinus: No maxillary sinus tenderness or frontal sinus tenderness.     Mouth/Throat:     Pharynx: Uvula midline.  Eyes:     General: Lids are normal. Lids are everted, no foreign bodies appreciated.     Conjunctiva/sclera: Conjunctivae normal.     Pupils: Pupils are equal, round, and reactive to light.  Neck:     Thyroid: No thyroid mass or thyromegaly.     Vascular: No carotid bruit.     Trachea: Trachea normal.  Cardiovascular:     Rate and Rhythm: Normal rate and regular rhythm.     Pulses: Normal pulses.     Heart sounds: Normal heart sounds, S1 normal and S2 normal. No murmur heard.   No friction rub. No gallop.  Pulmonary:     Effort: Pulmonary effort is normal. No tachypnea or respiratory distress.     Breath sounds: Normal breath sounds. No decreased breath sounds, wheezing, rhonchi or rales.  Abdominal:     General: Bowel sounds are normal.     Palpations: Abdomen is soft.     Tenderness: There is no abdominal tenderness.  Musculoskeletal:     Cervical back: Normal range of motion and neck supple.     Lumbar back: Tenderness present. No bony tenderness. Decreased range of motion. Negative right straight leg raise test and  negative left straight leg raise test.  Skin:    General: Skin is warm and dry.     Findings: No rash.  Neurological:     Mental Status: She is alert.  Psychiatric:        Mood and Affect: Mood is not anxious or depressed.        Speech: Speech  normal.        Behavior: Behavior normal. Behavior is cooperative.        Thought Content: Thought content normal.        Judgment: Judgment normal.      Results for orders placed or performed during the hospital encounter of 11/06/21  Lipase, blood  Result Value Ref Range   Lipase 20 11 - 51 U/L  Comprehensive metabolic panel  Result Value Ref Range   Sodium 135 135 - 145 mmol/L   Potassium 4.4 3.5 - 5.1 mmol/L   Chloride 99 98 - 111 mmol/L   CO2 27 22 - 32 mmol/L   Glucose, Bld 100 (H) 70 - 99 mg/dL   BUN 30 (H) 8 - 23 mg/dL   Creatinine, Ser 1.04 (H) 0.44 - 1.00 mg/dL   Calcium 9.9 8.9 - 10.3 mg/dL   Total Protein 7.4 6.5 - 8.1 g/dL   Albumin 4.1 3.5 - 5.0 g/dL   AST 17 15 - 41 U/L   ALT 9 0 - 44 U/L   Alkaline Phosphatase 49 38 - 126 U/L   Total Bilirubin 0.4 0.3 - 1.2 mg/dL   GFR, Estimated 50 (L) >60 mL/min   Anion gap 9 5 - 15  CBC  Result Value Ref Range   WBC 8.1 4.0 - 10.5 K/uL   RBC 4.57 3.87 - 5.11 MIL/uL   Hemoglobin 13.9 12.0 - 15.0 g/dL   HCT 41.8 36.0 - 46.0 %   MCV 91.5 80.0 - 100.0 fL   MCH 30.4 26.0 - 34.0 pg   MCHC 33.3 30.0 - 36.0 g/dL   RDW 13.1 11.5 - 15.5 %   Platelets 246 150 - 400 K/uL   nRBC 0.0 0.0 - 0.2 %  Urinalysis, Routine w reflex microscopic  Result Value Ref Range   Color, Urine YELLOW YELLOW   APPearance HAZY (A) CLEAR   Specific Gravity, Urine 1.010 1.005 - 1.030   pH 5.5 5.0 - 8.0   Glucose, UA NEGATIVE NEGATIVE mg/dL   Hgb urine dipstick NEGATIVE NEGATIVE   Bilirubin Urine NEGATIVE NEGATIVE   Ketones, ur NEGATIVE NEGATIVE mg/dL   Protein, ur NEGATIVE NEGATIVE mg/dL   Nitrite NEGATIVE NEGATIVE   Leukocytes,Ua TRACE (A) NEGATIVE   RBC / HPF 0-5 0 - 5 RBC/hpf   WBC, UA 6-10 0 - 5  WBC/hpf   Bacteria, UA FEW (A) NONE SEEN   Squamous Epithelial / LPF 0-5 0 - 5   Mucus PRESENT     This visit occurred during the SARS-CoV-2 public health emergency.  Safety protocols were in place, including screening questions prior to the visit, additional usage of staff PPE, and extensive cleaning of exam room while observing appropriate contact time as indicated for disinfecting solutions.   COVID 19 screen:  No recent travel or known exposure to COVID19 The patient denies respiratory symptoms of COVID 19 at this time. The importance of social distancing was discussed today.   Assessment and Plan    Problem List Items Addressed This Visit     Chronic constipation    Increase water, add fiber and continue miralax daily.      [place]    Rx for scopolamine patches given. Reviewed SE of sedation.      Urinary tract infection without hematuria - Primary    Acute , improving on keflex.  pt told to call if symptoms recur... do culture      Meds ordered this encounter  Medications  scopolamine (TRANSDERM-SCOP) 1 MG/3DAYS    Sig: Place 1 patch (1.5 mg total) onto the skin every 3 (three) days.    Dispense:  2 patch    Refill:  0     Eliezer Lofts, MD

## 2021-11-12 NOTE — Patient Instructions (Signed)
Complete antibiotics.  Continue Miralax daily.  Increase water.  Start Benefiber or Miralax for fiber supplement.  Can use tylenol extra strength 500 mg 2 tabs twice daily as needed for low back pain.

## 2021-11-12 NOTE — Assessment & Plan Note (Signed)
Rx for scopolamine patches given. Reviewed SE of sedation.

## 2021-11-12 NOTE — Assessment & Plan Note (Signed)
Increase water, add fiber and continue miralax daily.

## 2021-11-12 NOTE — Assessment & Plan Note (Signed)
Acute , improving on keflex.  pt told to call if symptoms recur... do culture

## 2021-11-12 NOTE — Telephone Encounter (Signed)
-----   Message from Alfonso Patten, MD sent at 11/12/2021  9:31 AM EST ----- MRSA If doing well, continue mupirocin ointment until ear healed. If ear still tender, would add doxycycline 100 mg twice a day for 7 days.   MAs please call. Thank you!

## 2021-11-18 ENCOUNTER — Telehealth: Payer: Self-pay

## 2021-11-18 NOTE — Telephone Encounter (Signed)
Daughter and patient advised of culture results together. Patient's ear is healing well and will continue Mupirocin until completely healed. aw

## 2021-11-18 NOTE — Telephone Encounter (Signed)
-----   Message from Alfonso Patten, MD sent at 11/12/2021  9:31 AM EST ----- MRSA If doing well, continue mupirocin ointment until ear healed. If ear still tender, would add doxycycline 100 mg twice a day for 7 days.   MAs please call. Thank you!

## 2021-11-26 ENCOUNTER — Ambulatory Visit (INDEPENDENT_AMBULATORY_CARE_PROVIDER_SITE_OTHER): Payer: Medicare Other | Admitting: Family Medicine

## 2021-11-26 ENCOUNTER — Encounter: Payer: Self-pay | Admitting: Family Medicine

## 2021-11-26 ENCOUNTER — Other Ambulatory Visit: Payer: Self-pay

## 2021-11-26 VITALS — BP 140/66 | HR 66 | Temp 98.5°F | Ht 63.25 in | Wt 157.4 lb

## 2021-11-26 DIAGNOSIS — R051 Acute cough: Secondary | ICD-10-CM | POA: Diagnosis not present

## 2021-11-26 LAB — POC INFLUENZA A&B (BINAX/QUICKVUE)
Influenza A, POC: NEGATIVE
Influenza B, POC: NEGATIVE

## 2021-11-26 LAB — POC COVID19 BINAXNOW: SARS Coronavirus 2 Ag: NEGATIVE

## 2021-11-26 MED ORDER — AMOXICILLIN 500 MG PO CAPS: 1000.0000 mg | ORAL_CAPSULE | Freq: Two times a day (BID) | ORAL | 0 refills | Status: DC

## 2021-11-26 NOTE — Patient Instructions (Addendum)
Keep up with fluids. Use tylenol for sore throat. Can use Flonase for nasal congestion as well as nasal saline spray to irrigate sinuses. Can use Mucinex DM for cough twice daily.    If not improving in 3-4 days fill antibiotics.

## 2021-11-26 NOTE — Progress Notes (Signed)
Patient ID: [name], female    DOB: July 14, 1925, 88 y.o.   MRN: [id number]  This visit was conducted in person.  BP 140/66    Pulse 66    Temp 98.5 F (36.9 C) (Temporal)    Ht 5' 3.25" (1.607 m)    Wt 157 lb 6 oz (71.4 kg)    SpO2 98%    BMI 27.66 kg/m    CC: Chief Complaint  Patient presents with   Cough    Started Sunday Night Negative Home Covid Test yesterday   Hoarse   Nasal Congestion    Subjective:   HPI: [name] is a 88 y.o. female presenting on 11/26/2021 for Cough (Started Sunday Night/Negative Home Covid Test yesterday), Hoarse, and Nasal Congestion  Date of onset:11/24/21  She reports  she started with runny nose, hoarse voice, post nasal drip. Has sore throat.  No ear pain, no  sinus pain.  No new body aches. Some increase in fatigue.  Dry cough, stable SOB, no wheeze.  No fever, no chills.    She is going on a cruise on  12/16/2021    Recent antibiotics  in January for UTI... keflex.  COVID 19 screen COVID testing: home test negative on day 2 of illness COVID vaccine:  03/19/2021   Pfizer COVID-19 Vaccine 08/05/2020 , 11/30/2019 , 11/09/2019    Daughter with URI.. not checked for COVID.  COVID exposure: No recent travel or known exposure to Delta  The importance of social distancing was discussed today.    Relevant past medical, surgical, family and social history reviewed and updated as indicated. Interim medical history since our last visit reviewed. Allergies and medications reviewed and updated. Outpatient Medications Prior to Visit  Medication Sig Dispense Refill   aspirin 81 MG tablet Take 81 mg by mouth daily.     cephALEXin (KEFLEX) 500 MG capsule Take 1 capsule (500 mg total) by mouth 3 (three) times daily. 26 capsule 0   colchicine 0.6 MG tablet 1 tab po x 1 then repeat in 2 hours, continue daily until flare resolved. 30 tablet 1   fish oil-omega-3 fatty acids 1000 MG capsule Take 2 g by mouth 2 (two) times daily.       Glucosamine Sulfate 500 MG CAPS Take 500 mg by mouth 2 (two) times daily.     hydroxypropyl methylcellulose (ISOPTO TEARS) 2.5 % ophthalmic solution Place 1 drop into both eyes 3 (three) times daily as needed (dry eyes).     lisinopril-hydrochlorothiazide (ZESTORETIC) 20-12.5 MG tablet TAKE 2 TABLETS BY MOUTH  DAILY 180 tablet 0   metoprolol tartrate (LOPRESSOR) 25 MG tablet TAKE 1 TABLET BY MOUTH  TWICE DAILY (TAKE WITH 50  MG TABLET FOR A TOTAL OF 75 MG TWICE DAILY) 180 tablet 3   metoprolol tartrate (LOPRESSOR) 50 MG tablet TAKE 1 TABLET BY MOUTH  TWICE DAILY (TAKE WITH 25  MG FOR A TOTAL OF 75 MG  TWICE DAILY) 180 tablet 3   Multiple Vitamin (MULTIVITAMIN) tablet Take 1 tablet by mouth daily.     mupirocin ointment (BACTROBAN) 2 % Apply 1 application topically 3 (three) times daily. 22 g 0   neomycin-polymyxin b-dexamethasone (MAXITROL) 3.5-10000-0.1 OINT      omeprazole (PRILOSEC) 40 MG capsule TAKE 1 CAPSULE BY MOUTH  DAILY 90 capsule 0   OVER THE COUNTER MEDICATION I caps 1 tab bid     polyethylene glycol (MIRALAX / GLYCOLAX) 17 g packet Take 17 g by  mouth daily. 5 each 0   scopolamine (TRANSDERM-SCOP) 1 MG/3DAYS Place 1 patch (1.5 mg total) onto the skin every 3 (three) days. 2 patch 0   sertraline (ZOLOFT) 50 MG tablet TAKE 1 TABLET BY MOUTH  DAILY AT BEDTIME 90 tablet 0   No facility-administered medications prior to visit.     Per HPI unless specifically indicated in ROS section below Review of Systems  Constitutional:  Negative for fatigue and fever.  HENT:  Positive for congestion.   Eyes:  Negative for pain.  Respiratory:  Positive for cough. Negative for shortness of breath.   Cardiovascular:  Negative for chest pain, palpitations and leg swelling.  Gastrointestinal:  Negative for abdominal pain.  Genitourinary:  Negative for dysuria and vaginal bleeding.  Musculoskeletal:  Negative for back pain.  Neurological:  Negative for syncope, light-headedness and headaches.   Psychiatric/Behavioral:  Negative for dysphoric mood.   Objective:  BP 140/66    Pulse 66    Temp 98.5 F (36.9 C) (Temporal)    Ht 5' 3.25" (1.607 m)    Wt 157 lb 6 oz (71.4 kg)    SpO2 98%    BMI 27.66 kg/m   Wt Readings from Last 3 Encounters:  11/26/21 157 lb 6 oz (71.4 kg)  11/12/21 157 lb (71.2 kg)  09/25/21 163 lb (73.9 kg)      Physical Exam    Results for orders placed or performed during the hospital encounter of 11/06/21  Lipase, blood  Result Value Ref Range   Lipase 20 11 - 51 U/L  Comprehensive metabolic panel  Result Value Ref Range   Sodium 135 135 - 145 mmol/L   Potassium 4.4 3.5 - 5.1 mmol/L   Chloride 99 98 - 111 mmol/L   CO2 27 22 - 32 mmol/L   Glucose, Bld 100 (H) 70 - 99 mg/dL   BUN 30 (H) 8 - 23 mg/dL   Creatinine, Ser 1.04 (H) 0.44 - 1.00 mg/dL   Calcium 9.9 8.9 - 10.3 mg/dL   Total Protein 7.4 6.5 - 8.1 g/dL   Albumin 4.1 3.5 - 5.0 g/dL   AST 17 15 - 41 U/L   ALT 9 0 - 44 U/L   Alkaline Phosphatase 49 38 - 126 U/L   Total Bilirubin 0.4 0.3 - 1.2 mg/dL   GFR, Estimated 50 (L) >60 mL/min   Anion gap 9 5 - 15  CBC  Result Value Ref Range   WBC 8.1 4.0 - 10.5 K/uL   RBC 4.57 3.87 - 5.11 MIL/uL   Hemoglobin 13.9 12.0 - 15.0 g/dL   HCT 41.8 36.0 - 46.0 %   MCV 91.5 80.0 - 100.0 fL   MCH 30.4 26.0 - 34.0 pg   MCHC 33.3 30.0 - 36.0 g/dL   RDW 13.1 11.5 - 15.5 %   Platelets 246 150 - 400 K/uL   nRBC 0.0 0.0 - 0.2 %  Urinalysis, Routine w reflex microscopic  Result Value Ref Range   Color, Urine YELLOW YELLOW   APPearance HAZY (A) CLEAR   Specific Gravity, Urine 1.010 1.005 - 1.030   pH 5.5 5.0 - 8.0   Glucose, UA NEGATIVE NEGATIVE mg/dL   Hgb urine dipstick NEGATIVE NEGATIVE   Bilirubin Urine NEGATIVE NEGATIVE   Ketones, ur NEGATIVE NEGATIVE mg/dL   Protein, ur NEGATIVE NEGATIVE mg/dL   Nitrite NEGATIVE NEGATIVE   Leukocytes,Ua TRACE (A) NEGATIVE   RBC / HPF 0-5 0 - 5 RBC/hpf   WBC, UA 6-10  0 - 5 WBC/hpf   Bacteria, UA FEW (A) NONE SEEN    Squamous Epithelial / LPF 0-5 0 - 5   Mucus PRESENT     This visit occurred during the SARS-CoV-2 public health emergency.  Safety protocols were in place, including screening questions prior to the visit, additional usage of staff PPE, and extensive cleaning of exam room while observing appropriate contact time as indicated for disinfecting solutions.   COVID 19 screen:  No recent travel or known exposure to COVID19 The patient denies respiratory symptoms of COVID 19 at this time. The importance of social distancing was discussed today.   Assessment and Plan Negative COVID and flu , rapid tests Problem List Items Addressed This Visit     Acute cough - Primary    Likely viral URI... symptomatic care.  if not improving in 3-4 days fill rx for  antibiotics to cover bacterial sinusitis.      Relevant Orders   POC COVID-19 BinaxNow (Completed)   POC Influenza A&B(BINAX/QUICKVUE) (Completed)       Eliezer Lofts, MD

## 2021-11-26 NOTE — Assessment & Plan Note (Signed)
Likely viral URI... symptomatic care.  if not improving in 3-4 days fill rx for  antibiotics to cover bacterial sinusitis.

## 2021-12-05 ENCOUNTER — Telehealth: Payer: Self-pay | Admitting: Family Medicine

## 2021-12-05 MED ORDER — BENZONATATE 200 MG PO CAPS: 200.0000 mg | ORAL_CAPSULE | Freq: Two times a day (BID) | ORAL | 0 refills | Status: DC | PRN

## 2021-12-05 MED ORDER — DOXYCYCLINE HYCLATE 100 MG PO TABS: 100.0000 mg | ORAL_TABLET | Freq: Two times a day (BID) | ORAL | 0 refills | Status: DC

## 2021-12-05 NOTE — Telephone Encounter (Signed)
Given no improvement after course of antibiotics.Marland Kitchen will send in prescription to broaden antibiotics, but  if she is still not improving in 2-3 days or if she is having any shortness of breath.. she needs to return for an office visit and chest X-ray.

## 2021-12-05 NOTE — Telephone Encounter (Signed)
Patient's daughter called, mother is still not doing any better since the last visit, daughter states that amoxicillan is not helping at all She is still coughing, stuffy nose, runny nose  She needs an antibiotic and cough medicine  Uses the pharmacy:  [id number] - [place], [place] Phone:  [phone]  Fax:  [phone]

## 2021-12-05 NOTE — Telephone Encounter (Signed)
Spoke with pt's daughter, Thayer Headings (on dpr), relaying Dr. Rometta Emery message.  Verbalizes understanding and expresses her thanks.

## 2021-12-09 ENCOUNTER — Other Ambulatory Visit: Payer: Self-pay

## 2021-12-09 ENCOUNTER — Ambulatory Visit (INDEPENDENT_AMBULATORY_CARE_PROVIDER_SITE_OTHER): Payer: Medicare Other | Admitting: Family Medicine

## 2021-12-09 ENCOUNTER — Ambulatory Visit (INDEPENDENT_AMBULATORY_CARE_PROVIDER_SITE_OTHER)
Admission: RE | Admit: 2021-12-09 | Discharge: 2021-12-09 | Disposition: A | Discharge status: Home / Self Care | Payer: Medicare Other | Source: Ambulatory Visit | Attending: Family Medicine | Admitting: Family Medicine

## 2021-12-09 VITALS — BP 168/80 | HR 68 | Temp 98.1°F | Ht 63.25 in | Wt 154.2 lb

## 2021-12-09 DIAGNOSIS — R059 Cough, unspecified: Secondary | ICD-10-CM | POA: Diagnosis not present

## 2021-12-09 DIAGNOSIS — R053 Chronic cough: Secondary | ICD-10-CM

## 2021-12-09 DIAGNOSIS — J069 Acute upper respiratory infection, unspecified: Secondary | ICD-10-CM | POA: Diagnosis not present

## 2021-12-09 MED ORDER — PROMETHAZINE-DM 6.25-15 MG/5ML PO SYRP: 5.0000 mL | ORAL_SOLUTION | Freq: Four times a day (QID) | ORAL | 0 refills | Status: DC | PRN

## 2021-12-09 MED ORDER — ALBUTEROL SULFATE HFA 108 (90 BASE) MCG/ACT IN AERS: 2.0000 | INHALATION_SPRAY | Freq: Four times a day (QID) | RESPIRATORY_TRACT | 0 refills | Status: DC | PRN

## 2021-12-09 NOTE — Progress Notes (Signed)
Subjective:     [name] is a 88 y.o. female presenting for Cough (With shortness of breath x 3 weeks. Tessalon pearls don't help. )     Cough This is a new problem. The current episode started 1 to 4 weeks ago. The cough is Productive of sputum. Associated symptoms include nasal congestion, rhinorrhea, shortness of breath (worse than baseline) and wheezing. Pertinent negatives include no chills, ear pain, fever or sore throat. The symptoms are aggravated by exercise (talking). The treatment provided mild relief. There is no history of asthma or COPD.   First was placed on Amoxicillin w/o improvement Now on doxycycline w/o improvement - 4-5 days Filled the tessalon perles w/o improvement  Also taking mucinex DM w/ some improvement  Review of Systems  Constitutional:  Negative for chills and fever.  HENT:  Positive for rhinorrhea. Negative for ear pain and sore throat.   Respiratory:  Positive for cough, shortness of breath (worse than baseline) and wheezing.     Social History   Tobacco Use  Smoking Status Never  Smokeless Tobacco Never        Objective:    BP Readings from Last 3 Encounters:  12/09/21 (!) 168/80  11/26/21 140/66  11/12/21 (!) 160/70   Wt Readings from Last 3 Encounters:  12/09/21 154 lb 3 oz (69.9 kg)  11/26/21 157 lb 6 oz (71.4 kg)  11/12/21 157 lb (71.2 kg)    BP (!) 168/80    Pulse 68    Temp 98.1 F (36.7 C) (Oral)    Ht 5' 3.25" (1.607 m)    Wt 154 lb 3 oz (69.9 kg)    SpO2 97%    BMI 27.10 kg/m    Physical Exam Constitutional:      General: She is not in acute distress.    Appearance: She is well-developed. She is not diaphoretic.  HENT:     Head: Normocephalic and atraumatic.     Right Ear: External ear normal. There is impacted cerumen.     Left Ear: Tympanic membrane and external ear normal.     Nose: Congestion present.     Mouth/Throat:     Mouth: Mucous membranes are moist.     Pharynx: No posterior oropharyngeal  erythema.  Eyes:     Conjunctiva/sclera: Conjunctivae normal.  Cardiovascular:     Rate and Rhythm: Normal rate and regular rhythm.     Heart sounds: Murmur heard.  Pulmonary:     Effort: Pulmonary effort is normal. No respiratory distress.     Breath sounds: Normal breath sounds. No wheezing or rales.  Musculoskeletal:     Cervical back: Neck supple.  Skin:    General: Skin is warm and dry.     Capillary Refill: Capillary refill takes less than 2 seconds.  Neurological:     Mental Status: She is alert. Mental status is at baseline.  Psychiatric:        Mood and Affect: Mood normal.        Behavior: Behavior normal.     CXR: with out consolidation. No acute findings.      Assessment & Plan:   Problem List Items Addressed This Visit   None Visit Diagnoses     Viral URI with cough    -  Primary   Relevant Medications   promethazine-dextromethorphan (PROMETHAZINE-DM) 6.25-15 MG/5ML syrup   albuterol (VENTOLIN HFA) 108 (90 Base) MCG/ACT inhaler   Persistent cough for 3 weeks or longer  Relevant Orders   DG Chest 2 View      Patient with cough persisting for 3 weeks.  Currently on second round of antibiotics.  Chest x-ray without sign of pneumonia on my interpretation we will follow-up final read.  Discussed that viral cough can last up to 6 to 8 weeks reassurance provided.  Offered trial of albuterol which can help with coughing fits.  We will also prescribe promethazine-DM discussed sedation given age.  She lives with daughter and has lots of support.  Patient also with reflux history discussed if symptoms not improving could try famotidine in addition to omeprazole for a week or 2 to see if this helps with cough.  Though lower concern at this time for reflux  With ongoing sinus drainage discussed adding Flonase and possible daily allergy medicine to see if that improves cough.  Addendum: Final read for chest x-ray with possible atypical infection versus chronic  scarring.  At this point patient already on doxycycline which will likely treat atypical pneumonia.  However with limited improvement will offer course of azithromycin if patient prefers to switch medications.  Return if symptoms worsen or fail to improve.  Lesleigh Noe, MD  This visit occurred during the SARS-CoV-2 public health emergency.  Safety protocols were in place, including screening questions prior to the visit, additional usage of staff PPE, and extensive cleaning of exam room while observing appropriate contact time as indicated for disinfecting solutions.

## 2021-12-09 NOTE — Patient Instructions (Addendum)
Albuterol - consider using prior to activity that may trigger a coughing spell  Cough syrup - at night will make you sleepy  Sinus Congestion - try to treat this as often this is a cause of the cough 1) Neti Pot (Saline rinse) -- 2 times day -- if tolerated 2) Flonase (Store Brand ok) - once daily 3) Over the counter congestion medications 4) Can also try allergy medication   Cough 1) Cough drops can be helpful 2) Nyquil (or nighttime cough medication) 3) Honey is proven to be one of the best cough medications  4) Cough medicine with Dextromethorphan can also be helpful  Viral coughs can last 6-8 weeks  Consider Famotidine trial 1-2 weeks - for possible heartburn cause

## 2021-12-11 ENCOUNTER — Other Ambulatory Visit: Payer: Self-pay | Admitting: Family Medicine

## 2021-12-11 DIAGNOSIS — J189 Pneumonia, unspecified organism: Secondary | ICD-10-CM

## 2021-12-11 MED ORDER — AZITHROMYCIN 250 MG PO TABS: ORAL_TABLET | ORAL | 0 refills | Status: DC

## 2021-12-11 NOTE — Progress Notes (Signed)
Pt is not taking colchicine  Stop doxycycline Start azithromycin

## 2022-01-02 ENCOUNTER — Other Ambulatory Visit: Payer: Self-pay

## 2022-01-02 ENCOUNTER — Encounter: Payer: Self-pay | Admitting: Family Medicine

## 2022-01-02 ENCOUNTER — Ambulatory Visit (INDEPENDENT_AMBULATORY_CARE_PROVIDER_SITE_OTHER): Payer: Medicare Other | Admitting: Family Medicine

## 2022-01-02 VITALS — BP 146/84 | HR 63 | Ht 63.5 in | Wt 150.0 lb

## 2022-01-02 DIAGNOSIS — I10 Essential (primary) hypertension: Secondary | ICD-10-CM

## 2022-01-02 DIAGNOSIS — E782 Mixed hyperlipidemia: Secondary | ICD-10-CM

## 2022-01-02 DIAGNOSIS — R7303 Prediabetes: Secondary | ICD-10-CM

## 2022-01-02 DIAGNOSIS — F320 Major depressive disorder, single episode, mild: Secondary | ICD-10-CM | POA: Diagnosis not present

## 2022-01-02 DIAGNOSIS — Z0000 Encounter for general adult medical examination without abnormal findings: Secondary | ICD-10-CM

## 2022-01-02 DIAGNOSIS — E559 Vitamin D deficiency, unspecified: Secondary | ICD-10-CM

## 2022-01-02 DIAGNOSIS — N1831 Chronic kidney disease, stage 3a: Secondary | ICD-10-CM | POA: Diagnosis not present

## 2022-01-02 DIAGNOSIS — M109 Gout, unspecified: Secondary | ICD-10-CM

## 2022-01-02 DIAGNOSIS — I700 Atherosclerosis of aorta: Secondary | ICD-10-CM | POA: Diagnosis not present

## 2022-01-02 DIAGNOSIS — B029 Zoster without complications: Secondary | ICD-10-CM | POA: Diagnosis not present

## 2022-01-02 LAB — COMPREHENSIVE METABOLIC PANEL
ALT: 8 U/L (ref 0–35)
AST: 13 U/L (ref 0–37)
Albumin: 4 g/dL (ref 3.5–5.2)
Alkaline Phosphatase: 59 U/L (ref 39–117)
BUN: 24 mg/dL — ABNORMAL HIGH (ref 6–23)
CO2: 30 mEq/L (ref 19–32)
Calcium: 10.2 mg/dL (ref 8.4–10.5)
Chloride: 97 mEq/L (ref 96–112)
Creatinine, Ser: 0.96 mg/dL (ref 0.40–1.20)
GFR: 50.73 mL/min — ABNORMAL LOW (ref >60.00)
Glucose, Bld: 91 mg/dL (ref 70–99)
Potassium: 4.7 mEq/L (ref 3.5–5.1)
Sodium: 133 mEq/L — ABNORMAL LOW (ref 135–145)
Total Bilirubin: 0.4 mg/dL (ref 0.2–1.2)
Total Protein: 7.1 g/dL (ref 6.0–8.3)

## 2022-01-02 LAB — LIPID PANEL
Cholesterol: 229 mg/dL — ABNORMAL HIGH (ref 0–200)
HDL: 40.7 mg/dL (ref >39.00)
NonHDL: 188.6
Total CHOL/HDL Ratio: 6
Triglycerides: 252 mg/dL — ABNORMAL HIGH (ref 0.0–149.0)
VLDL: 50.4 mg/dL — ABNORMAL HIGH (ref 0.0–40.0)

## 2022-01-02 LAB — CBC WITH DIFFERENTIAL/PLATELET
Basophils Absolute: 0.1 10*3/uL (ref 0.0–0.1)
Basophils Relative: 0.7 % (ref 0.0–3.0)
Eosinophils Absolute: 0.4 10*3/uL (ref 0.0–0.7)
Eosinophils Relative: 3.8 % (ref 0.0–5.0)
HCT: 40.2 % (ref 36.0–46.0)
Hemoglobin: 13.6 g/dL (ref 12.0–15.0)
Lymphocytes Relative: 30.2 % (ref 12.0–46.0)
Lymphs Abs: 2.9 10*3/uL (ref 0.7–4.0)
MCHC: 33.8 g/dL (ref 30.0–36.0)
MCV: 90.9 fl (ref 78.0–100.0)
Monocytes Absolute: 0.9 10*3/uL (ref 0.1–1.0)
Monocytes Relative: 9.1 % (ref 3.0–12.0)
Neutro Abs: 5.4 10*3/uL (ref 1.4–7.7)
Neutrophils Relative %: 56.2 % (ref 43.0–77.0)
Platelets: 287 10*3/uL (ref 150.0–400.0)
RBC: 4.42 Mil/uL (ref 3.87–5.11)
RDW: 13.4 % (ref 11.5–15.5)
WBC: 9.6 10*3/uL (ref 4.0–10.5)

## 2022-01-02 LAB — LDL CHOLESTEROL, DIRECT: Direct LDL: 137 mg/dL

## 2022-01-02 LAB — URIC ACID: Uric Acid, Serum: 7.5 mg/dL — ABNORMAL HIGH (ref 2.4–7.0)

## 2022-01-02 LAB — HEMOGLOBIN A1C: Hgb A1c MFr Bld: 6.3 % (ref 4.6–6.5)

## 2022-01-02 LAB — VITAMIN D 25 HYDROXY (VIT D DEFICIENCY, FRACTURES): VITD: 45.58 ng/mL (ref 30.00–100.00)

## 2022-01-02 NOTE — Patient Instructions (Addendum)
Please stop at the lab to have labs drawn. ? ?Start tylenol  500 mg 2 tabs three times daily for pain as needed. ?Can also use lidocaine patches for pain. ?Call if pain not controlled. ? ? ? ?Shingles ?Shingles, which is also known as herpes zoster, is an infection that causes a painful skin rash and fluid-filled blisters. It is caused by a virus. ?Shingles only develops in people who: ?Have had chickenpox. ?Have been vaccinated against chickenpox. Shingles is rare in this group. ?What are the causes? ?Shingles is caused by varicella-zoster virus. This is the same virus that causes chickenpox. After a person is exposed to the virus, it stays in the body in an inactive (dormant) state. Shingles develops if the virus is reactivated. This can happen many years after the first (initial) exposure to the virus. It is not known what causes this virus to be reactivated. ?What increases the risk? ?People who have had chickenpox or received the chickenpox vaccine are at risk for shingles. Shingles infection is more common in people who: ?Are older than 86 years of age. ?Have a weakened disease-fighting system (immune system), such as people with: ?HIV (human immunodeficiency virus). ?AIDS (acquired immunodeficiency syndrome). ?Cancer. ?Are taking medicines that weaken the immune system, such as organ transplant medicines. ?Are experiencing a lot of stress. ?What are the signs or symptoms? ?Early symptoms of this condition include itching, tingling, and pain in an area on your skin. Pain may be described as burning, stabbing, or throbbing. ?A few days or weeks after early symptoms start, a painful red rash appears. The rash is usually on one side of the body and has a band-like or belt-like pattern. The rash eventually turns into fluid-filled blisters that break open, change into scabs, and dry up in about 2-3 weeks. ?At any time during the infection, you may also develop: ?A fever. ?Chills. ?A headache. ?Nausea. ?How is this  diagnosed? ?This condition is diagnosed with a skin exam. Skin or fluid samples (a culture) may be taken from the blisters before a diagnosis is made. ?How is this treated? ?The rash may last for several weeks. There is not a specific cure for this condition. Your health care provider may prescribe medicines to help you manage pain, recover more quickly, and avoid long-term problems. Medicines may include: ?Antiviral medicines. ?Anti-inflammatory medicines. ?Pain medicines. ?Anti-itching medicines (antihistamines). ?If the area involved is on your face, you may be referred to a specialist, such as an eye doctor (ophthalmologist) or an ear, nose, and throat (ENT) doctor (otorhinolaryngologist) to help you avoid eye problems, chronic pain, or disability. ?Follow these instructions at home: ?Medicines ?Take over-the-counter and prescription medicines only as told by your health care provider. ?Apply an anti-itch cream or numbing cream to the affected area as told by your health care provider. ?Relieving itching and discomfort ? ?Apply cold, wet cloths (cold compresses) to the area of the rash or blisters as told by your health care provider. ?Cool baths can be soothing. Try adding baking soda or dry oatmeal to the water to reduce itching. Do not bathe in hot water. ?Use calamine lotion as recommended by your health care provider. This is an over-the-counter lotion that helps to relieve itchiness. ?Blister and rash care ?Keep your rash covered with a loose bandage (dressing). Wear loose-fitting clothing to help ease the pain of material rubbing against the rash. ?Wash your hands with soap and water for at least 20 seconds before and after you change your dressing. If soap  and water are not available, use hand sanitizer. ?Change your dressing as told by your health care provider. ?Keep your rash and blisters clean by washing the area with mild soap and cool water as told by your health care provider. ?Check your rash  every day for signs of infection. Check for: ?More redness, swelling, or pain. ?Fluid or blood. ?Warmth. ?Pus or a bad smell. ?Do not scratch your rash or pick at your blisters. To help avoid scratching: ?Keep your fingernails clean and cut short. ?Wear gloves or mittens while you sleep, if scratching is a problem. ?General instructions ?Rest as told by your health care provider. ?Wash your hands often with soap and water for at least 20 seconds. If soap and water are not available, use hand sanitizer. Doing this lowers your chance of getting a bacterial skin infection. ?Before your blisters change into scabs, your shingles infection can cause chickenpox in people who have never had it or have never been vaccinated against it. To prevent this from happening, avoid contact with other people, especially: ?Babies. ?Pregnant women. ?Children who have eczema. ?Older people who have transplants. ?People who have chronic illnesses, such as cancer or AIDS. ?Keep all follow-up visits. This is important. ?How is this prevented? ?Getting vaccinated is the best way to prevent shingles and protect against shingles complications. If you have not been vaccinated, talk with your health care provider about getting the vaccine. ?Where to find more information ?Centers for Disease Control and Prevention: http://www.wolf.info/ ?Contact a health care provider if: ?Your pain is not relieved with prescribed medicines. ?Your pain does not get better after the rash heals. ?You have any of these signs of infection: ?More redness, swelling, or pain around the rash. ?Fluid or blood coming from the rash. ?Warmth coming from your rash. ?Pus or a bad smell coming from the rash. ?A fever. ?Get help right away if: ?The rash is on your face or nose. ?You have facial pain, pain around your eye area, or loss of feeling on one side of your face. ?You have difficulty seeing. ?You have ear pain or have ringing in your ear. ?You have a loss of taste. ?Your  condition gets worse. ?Summary ?Shingles, also known as herpes zoster, is an infection that causes a painful skin rash and fluid-filled blisters. ?This condition is diagnosed with a skin exam. Skin or fluid samples (a culture) may be taken from the blisters. ?Keep your rash covered with a loose bandage (dressing). Wear loose-fitting clothing to help ease the pain of material rubbing against the rash. ?Before your blisters change into scabs, your shingles infection can cause chickenpox in people who have never had it or have never been vaccinated against it. ?This information is not intended to replace advice given to you by your health care provider. Make sure you discuss any questions you have with your health care provider. ?Document Revised: 10/01/2020 Document Reviewed: 10/01/2020 ?Elsevier Patient Education ? [place]. ? ?

## 2022-01-02 NOTE — Progress Notes (Signed)
? ? Patient ID: [name], female    DOB: [birth year], 88 y.o.   MRN: [id number] ? ?This visit was conducted in person. ? ?BP (!) 146/84   Pulse 63   Ht 5' 3.5" (1.613 m)   Wt 150 lb (68 kg)   SpO2 99%   BMI 26.15 kg/m?   ? ?CC:  ?Chief Complaint  ?Patient presents with  ? Annual Exam  ?  Physical   ? ? ?Subjective:  ? ?HPI: ?[name] is a 88 y.o. female presenting on 01/02/2022 for Annual Exam (Physical ) ? ?The patient presents for annual medicare wellness, complete physical and review of chronic health problems. He/She also has the following acute concerns today: ?Has new skin area in last week. ? Area is sore,  woke her last night with the pain in the area,there is a scab. ? No drainage. ? Has not treated with anything. ? ? Recent PNA has improved. No recent fever following antibiotic. ?Still with occ cough. ? ?I have personally reviewed the Medicare Annual Wellness questionnaire and have noted ?1. The patient's medical and social history ?2. Their use of alcohol, tobacco or illicit drugs ?3. Their current medications and supplements ?4. The patient's functional ability including ADL's, fall risks, home safety risks and hearing or visual ?            impairment. ?5. Diet and physical activities ?6. Evidence for depression or mood disorders ?7.         Updated provider list ?Cognitive evaluation was performed and recorded on pt medicare questionnaire form. ?The patients weight, height, BMI and visual acuity have been recorded in the chart   ?I have made referrals, counseling and provided education to the patient based review of the above and I have provided the pt with a written personalized care plan for preventive services.  ? ? Documentation of this information was scanned into the electronic record under the media tab. ? ? Advance directives and end of life planning reviewed in detail with patient and documented in EMR. Patient given handout on advance care directives if needed. HCPOA and  living will updated if needed. ? ?No falls in last 12 months. ? ?Screening hearing and vision performed  if needed and documented  ? ?[place] Office Visit from 01/02/2022 in [place] at [place]  ?PHQ-2 Total Score 0  ? ?  ? ?Hypertension:   Tolerable control for age.  ?BP Readings from Last 3 Encounters:  ?01/02/22 (!) 146/84  ?12/09/21 (!) 168/80  ?11/26/21 140/66  ?Using medication without problems or lightheadedness:  none ?Chest pain with exertion: none ?Edema:none ?Short of breath:  back at baseline ?Average home BPs: not checking ?Other issues: ? ? Due for lab re-eval ? ? ?  ? ?   ? ?Relevant past medical, surgical, family and social history reviewed and updated as indicated. Interim medical history since our last visit reviewed. ?Allergies and medications reviewed and updated. ?Outpatient Medications Prior to Visit  ?Medication Sig Dispense Refill  ? albuterol (VENTOLIN HFA) 108 (90 Base) MCG/ACT inhaler Inhale 2 puffs into the lungs every 6 (six) hours as needed for wheezing or shortness of breath. 8 g 0  ? aspirin 81 MG tablet Take 81 mg by mouth daily.    ? colchicine 0.6 MG tablet 1 tab po x 1 then repeat in 2 hours, continue daily until flare resolved. 30 tablet 1  ? fish oil-omega-3 fatty acids 1000 MG capsule Take 2 g  by mouth 2 (two) times daily.     ? Glucosamine Sulfate 500 MG CAPS Take 500 mg by mouth 2 (two) times daily.    ? hydroxypropyl methylcellulose (ISOPTO TEARS) 2.5 % ophthalmic solution Place 1 drop into both eyes 3 (three) times daily as needed (dry eyes).    ? lisinopril-hydrochlorothiazide (ZESTORETIC) 20-12.5 MG tablet TAKE 2 TABLETS BY MOUTH  DAILY 180 tablet 0  ? metoprolol tartrate (LOPRESSOR) 25 MG tablet TAKE 1 TABLET BY MOUTH  TWICE DAILY (TAKE WITH 50  MG TABLET FOR A TOTAL OF 75 MG TWICE DAILY) 180 tablet 3  ? metoprolol tartrate (LOPRESSOR) 50 MG tablet TAKE 1 TABLET BY MOUTH  TWICE DAILY (TAKE WITH 25  MG FOR A TOTAL OF 75 MG  TWICE DAILY) 180 tablet 3  ?  Multiple Vitamin (MULTIVITAMIN) tablet Take 1 tablet by mouth daily.    ? mupirocin ointment (BACTROBAN) 2 % Apply 1 application topically 3 (three) times daily. 22 g 0  ? neomycin-polymyxin b-dexamethasone (MAXITROL) 3.5-10000-0.1 OINT     ? omeprazole (PRILOSEC) 40 MG capsule TAKE 1 CAPSULE BY MOUTH  DAILY 90 capsule 0  ? OVER THE COUNTER MEDICATION I caps 1 tab bid    ? polyethylene glycol (MIRALAX / GLYCOLAX) 17 g packet Take 17 g by mouth daily. 5 each 0  ? promethazine-dextromethorphan (PROMETHAZINE-DM) 6.25-15 MG/5ML syrup Take 5 mLs by mouth 4 (four) times daily as needed for cough. 118 mL 0  ? scopolamine (TRANSDERM-SCOP) 1 MG/3DAYS Place 1 patch (1.5 mg total) onto the skin every 3 (three) days. 2 patch 0  ? sertraline (ZOLOFT) 50 MG tablet TAKE 1 TABLET BY MOUTH  DAILY AT BEDTIME 90 tablet 0  ? azithromycin (ZITHROMAX) 250 MG tablet Take 2 tablets (500 mg) today. Then take 1 tablet daily for the next 4 days. 6 tablet 0  ? benzonatate (TESSALON) 200 MG capsule Take 1 capsule (200 mg total) by mouth 2 (two) times daily as needed for cough. 20 capsule 0  ? doxycycline (VIBRA-TABS) 100 MG tablet Take 1 tablet (100 mg total) by mouth 2 (two) times daily. 20 tablet 0  ? ?No facility-administered medications prior to visit.  ?  ? ?Per HPI unless specifically indicated in ROS section below ?Review of Systems  ?Constitutional:  Negative for fatigue and fever.  ?HENT:  Negative for congestion.   ?Eyes:  Negative for pain.  ?Respiratory:  Negative for cough and shortness of breath.   ?Cardiovascular:  Negative for chest pain, palpitations and leg swelling.  ?Gastrointestinal:  Negative for abdominal pain.  ?Genitourinary:  Negative for dysuria and vaginal bleeding.  ?Musculoskeletal:  Negative for back pain.  ?Neurological:  Negative for syncope, light-headedness and headaches.  ?Psychiatric/Behavioral:  Negative for dysphoric mood.   ?Objective:  ?BP (!) 146/84   Pulse 63   Ht 5' 3.5" (1.613 m)   Wt 150 lb (68  kg)   SpO2 99%   BMI 26.15 kg/m?   ?Wt Readings from Last 3 Encounters:  ?01/02/22 150 lb (68 kg)  ?12/09/21 154 lb 3 oz (69.9 kg)  ?11/26/21 157 lb 6 oz (71.4 kg)  ?  ?  ?Physical Exam ?Constitutional:   ?   General: She is not in acute distress. ?   Appearance: Normal appearance. She is well-developed. She is not ill-appearing or toxic-appearing.  ?HENT:  ?   Head: Normocephalic.  ?   Right Ear: Hearing, tympanic membrane, ear canal and external ear normal. Tympanic membrane is not erythematous,  retracted or bulging.  ?   Left Ear: Hearing, tympanic membrane, ear canal and external ear normal. Tympanic membrane is not erythematous, retracted or bulging.  ?   Nose: No mucosal edema or rhinorrhea.  ?   Right Sinus: No maxillary sinus tenderness or frontal sinus tenderness.  ?   Left Sinus: No maxillary sinus tenderness or frontal sinus tenderness.  ?   Mouth/Throat:  ?   Pharynx: Uvula midline.  ?Eyes:  ?   General: Lids are normal. Lids are everted, no foreign bodies appreciated.  ?   Conjunctiva/sclera: Conjunctivae normal.  ?   Pupils: Pupils are equal, round, and reactive to light.  ?Neck:  ?   Thyroid: No thyroid mass or thyromegaly.  ?   Vascular: No carotid bruit.  ?   Trachea: Trachea normal.  ?Cardiovascular:  ?   Rate and Rhythm: Normal rate and regular rhythm.  ?   Pulses: Normal pulses.  ?   Heart sounds: Normal heart sounds, S1 normal and S2 normal. No murmur heard. ?  No friction rub. No gallop.  ?Pulmonary:  ?   Effort: Pulmonary effort is normal. No tachypnea or respiratory distress.  ?   Breath sounds: Normal breath sounds. No decreased breath sounds, wheezing, rhonchi or rales.  ?Abdominal:  ?   General: Bowel sounds are normal.  ?   Palpations: Abdomen is soft.  ?   Tenderness: There is no abdominal tenderness.  ?Musculoskeletal:  ?   Cervical back: Normal range of motion and neck supple.  ?Skin: ?   General: Skin is warm and dry.  ?   Findings: No rash.  ?Neurological:  ?   Mental Status: She  is alert.  ?Psychiatric:     ?   Mood and Affect: Mood is not anxious or depressed.     ?   Speech: Speech normal.     ?   Behavior: Behavior normal. Behavior is cooperative.     ?   Thought Content: Thought con

## 2022-01-07 ENCOUNTER — Other Ambulatory Visit: Payer: Self-pay | Admitting: Family Medicine

## 2022-01-07 DIAGNOSIS — J069 Acute upper respiratory infection, unspecified: Secondary | ICD-10-CM

## 2022-01-07 NOTE — Telephone Encounter (Signed)
Refill request Albuterol ?Last office visit 01/02/22 ?Last refill 2/20/823  8 G ?

## 2022-01-14 ENCOUNTER — Other Ambulatory Visit: Payer: Self-pay | Admitting: Family Medicine

## 2022-01-23 DIAGNOSIS — B029 Zoster without complications: Secondary | ICD-10-CM | POA: Insufficient documentation

## 2022-01-23 NOTE — Assessment & Plan Note (Signed)
Chronic, stable ? ?Current water intake. ?

## 2022-01-23 NOTE — Assessment & Plan Note (Signed)
,   Stable control on sertraline 50 mg daily ?

## 2022-01-23 NOTE — Assessment & Plan Note (Signed)
Acute, ? ?Reviewed diagnosis and possible complications.  She is past the time course for using an antiviral medication. ?Start tylenol  500 mg 2 tabs three times daily for pain as needed. ?Can also use lidocaine patches for pain. ?Call if pain not controlled. ?

## 2022-01-23 NOTE — Assessment & Plan Note (Addendum)
Chronic, stable ? ?Tolerable control for age on lisinopril/hydrochlorothiazide 20/12.5 mg daily, metoprolol 25 mg 1 tablet twice daily along with an additional 50 mg 1 tablet twice daily for total of 75 mg twice daily. ?

## 2022-01-23 NOTE — Assessment & Plan Note (Signed)
Chronic, stable ? ?No recent flares.  ?

## 2022-03-06 DIAGNOSIS — H6123 Impacted cerumen, bilateral: Secondary | ICD-10-CM | POA: Diagnosis not present

## 2022-03-11 ENCOUNTER — Other Ambulatory Visit: Payer: Self-pay | Admitting: Family Medicine

## 2022-03-11 DIAGNOSIS — J069 Acute upper respiratory infection, unspecified: Secondary | ICD-10-CM

## 2022-03-17 ENCOUNTER — Other Ambulatory Visit: Payer: Self-pay | Admitting: Family Medicine

## 2022-04-01 ENCOUNTER — Other Ambulatory Visit: Payer: Self-pay | Admitting: Family Medicine

## 2022-04-01 DIAGNOSIS — Z1231 Encounter for screening mammogram for malignant neoplasm of breast: Secondary | ICD-10-CM

## 2022-04-03 ENCOUNTER — Telehealth: Payer: Self-pay | Admitting: Family Medicine

## 2022-04-03 NOTE — Telephone Encounter (Signed)
Handicap Placard Application completed and placed in Dr. Rometta Emery office in box for signature.

## 2022-04-03 NOTE — Telephone Encounter (Signed)
Patient daughter dropped paperwork for placard to be filled out by PCP and has been placed in PCP folder.

## 2022-04-04 NOTE — Telephone Encounter (Signed)
Left message for Ms. Charo that her handicap placard application is ready to be picked up at the front desk.

## 2022-04-30 ENCOUNTER — Ambulatory Visit
Admission: RE | Admit: 2022-04-30 | Discharge: 2022-04-30 | Disposition: A | Discharge status: Home / Self Care | Payer: Medicare Other | Source: Ambulatory Visit | Attending: Family Medicine | Admitting: Family Medicine

## 2022-04-30 DIAGNOSIS — Z1231 Encounter for screening mammogram for malignant neoplasm of breast: Secondary | ICD-10-CM | POA: Diagnosis not present

## 2022-05-01 ENCOUNTER — Encounter: Payer: Self-pay | Admitting: Dermatology

## 2022-05-01 ENCOUNTER — Ambulatory Visit: Payer: Medicare Other | Admitting: Dermatology

## 2022-05-01 DIAGNOSIS — T23072A Burn of unspecified degree of left wrist, initial encounter: Secondary | ICD-10-CM | POA: Diagnosis not present

## 2022-05-01 DIAGNOSIS — Z85828 Personal history of other malignant neoplasm of skin: Secondary | ICD-10-CM | POA: Diagnosis not present

## 2022-05-01 DIAGNOSIS — L814 Other melanin hyperpigmentation: Secondary | ICD-10-CM | POA: Diagnosis not present

## 2022-05-01 DIAGNOSIS — Z1283 Encounter for screening for malignant neoplasm of skin: Secondary | ICD-10-CM

## 2022-05-01 DIAGNOSIS — T300 Burn of unspecified body region, unspecified degree: Secondary | ICD-10-CM

## 2022-05-01 DIAGNOSIS — D229 Melanocytic nevi, unspecified: Secondary | ICD-10-CM

## 2022-05-01 DIAGNOSIS — D1800 Hemangioma unspecified site: Secondary | ICD-10-CM

## 2022-05-01 DIAGNOSIS — L570 Actinic keratosis: Secondary | ICD-10-CM | POA: Diagnosis not present

## 2022-05-01 DIAGNOSIS — L578 Other skin changes due to chronic exposure to nonionizing radiation: Secondary | ICD-10-CM | POA: Diagnosis not present

## 2022-05-01 DIAGNOSIS — Z86007 Personal history of in-situ neoplasm of skin: Secondary | ICD-10-CM | POA: Diagnosis not present

## 2022-05-01 DIAGNOSIS — L821 Other seborrheic keratosis: Secondary | ICD-10-CM | POA: Diagnosis not present

## 2022-05-01 NOTE — Progress Notes (Signed)
Follow-Up Visit   Subjective  [name] is a 88 y.o. female who presents for the following: Annual Exam (Here for skin cancer screening. Full body. Hx of SCC's ).  The patient presents for Total-Body Skin Exam (TBSE) for skin cancer screening and mole check.  The patient has spots, moles and lesions to be evaluated, some may be new or changing and the patient has concerns that these could be cancer.  Daughter with patient.   The following portions of the chart were reviewed this encounter and updated as appropriate:  Tobacco  Allergies  Meds  Problems  Med Hx  Surg Hx  Fam Hx      Review of Systems: No other skin or systemic complaints except as noted in HPI or Assessment and Plan.   Objective  Well appearing patient in no apparent distress; mood and affect are within normal limits.  A full examination was performed including scalp, head, eyes, ears, nose, lips, neck, chest, axillae, abdomen, back, buttocks, bilateral upper extremities, bilateral lower extremities, hands, feet, fingers, toes, fingernails, and toenails. All findings within normal limits unless otherwise noted below.  Left Wrist - Anterior Erythematous linear lesion with eschar  Left Ankle x1, left distal calf x6 (7) Erythematous thin papules/macules with gritty scale.    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  - No evidence of recurrence today - No lymphadenopathy - Recommend regular full body skin exams - Recommend daily broad spectrum sunscreen SPF 30+ to sun-exposed areas, reapply every 2 hours as needed.  - Call if any new or changing lesions are noted between office visits  History of Squamous Cell Carcinoma in Situ of the Skin. Mid vertex scalp. 2021. - No evidence of recurrence today - Recommend regular full body skin exams - Recommend daily broad spectrum sunscreen SPF 30+ to sun-exposed areas, reapply every 2 hours as needed.  -  Call if any new or changing lesions are noted between office visits   Lentigines - Scattered tan macules - Due to sun exposure - Benign-appearing, observe - Recommend daily broad spectrum sunscreen SPF 30+ to sun-exposed areas, reapply every 2 hours as needed. - Call for any changes  Seborrheic Keratoses - Stuck-on, waxy, tan-brown papules and/or plaques  - Benign-appearing - Discussed benign etiology and prognosis. - Observe - Call for any changes  Melanocytic Nevi - Tan-brown and/or pink-flesh-colored symmetric macules and papules - Benign appearing on exam today - Observation - Call clinic for new or changing moles - Recommend daily use of broad spectrum spf 30+ sunscreen to sun-exposed areas.   Hemangiomas - Red papules - Discussed benign nature - Observe - Call for any changes  Actinic Damage - Chronic condition, secondary to cumulative UV/sun exposure - diffuse scaly erythematous macules with underlying dyspigmentation - Recommend daily broad spectrum sunscreen SPF 30+ to sun-exposed areas, reapply every 2 hours as needed.  - Staying in the shade or wearing long sleeves, sun glasses (UVA+UVB protection) and wide brim hats (4-inch brim around the entire circumference of the hat) are also recommended for sun protection.  - Call for new or changing lesions.  Skin cancer screening performed today.  Thermal burn Left Wrist - Anterior  Recommend using Vaseline Jelly and cover with band-aid daily.  AK (actinic keratosis) (7) Left Ankle x1, left distal calf x6  Actinic keratoses are precancerous spots that appear secondary to cumulative UV radiation exposure/sun exposure over time. They are chronic  with expected duration over 1 year. A portion of actinic keratoses will progress to squamous cell carcinoma of the skin. It is not possible to reliably predict which spots will progress to skin cancer and so treatment is recommended to prevent development of skin  cancer.  Recommend daily broad spectrum sunscreen SPF 30+ to sun-exposed areas, reapply every 2 hours as needed.  Recommend staying in the shade or wearing long sleeves, sun glasses (UVA+UVB protection) and wide brim hats (4-inch brim around the entire circumference of the hat). Call for new or changing lesions.  Destruction of lesion - Left Ankle x1, left distal calf x6  Destruction method: cryotherapy   Informed consent: discussed and consent obtained   Lesion destroyed using liquid nitrogen: Yes   Outcome: patient tolerated procedure well with no complications   Post-procedure details: wound care instructions given   Additional details:  Prior to procedure, discussed risks of blister formation, small wound, skin dyspigmentation, or rare scar following cryotherapy. Recommend Vaseline ointment to treated areas while healing.    Return in about 6 months (around 11/01/2022) for AK Follow Up, TBSE in 1 year, HxSCCs.  I, Emelia Salisbury, CMA, am acting as scribe for Forest Gleason, MD.  Documentation: I have reviewed the above documentation for accuracy and completeness, and I agree with the above.  Forest Gleason, MD

## 2022-05-01 NOTE — Patient Instructions (Addendum)
Cryotherapy Aftercare  Wash gently with soap and water everyday.   Apply Vaseline daily until healed.    Recommend daily broad spectrum sunscreen SPF 30+ to sun-exposed areas, reapply every 2 hours as needed. Call for new or changing lesions.  Staying in the shade or wearing long sleeves, sun glasses (UVA+UVB protection) and wide brim hats (4-inch brim around the entire circumference of the hat) are also recommended for sun protection.    Melanoma ABCDEs  Melanoma is the most dangerous type of skin cancer, and is the leading cause of death from skin disease.  You are more likely to develop melanoma if you: Have light-colored skin, light-colored eyes, or red or blond hair Spend a lot of time in the sun Tan regularly, either outdoors or in a tanning bed Have had blistering sunburns, especially during childhood Have a close family member who has had a melanoma Have atypical moles or large birthmarks  Early detection of melanoma is key since treatment is typically straightforward and cure rates are extremely high if we catch it early.   The first sign of melanoma is often a change in a mole or a new dark spot.  The ABCDE system is a way of remembering the signs of melanoma.  A for asymmetry:  The two halves do not match. B for border:  The edges of the growth are irregular. C for color:  A mixture of colors are present instead of an even brown color. D for diameter:  Melanomas are usually (but not always) greater than 6mm - the size of a pencil eraser. E for evolution:  The spot keeps changing in size, shape, and color.  Please check your skin once per month between visits. You can use a small mirror in front and a large mirror behind you to keep an eye on the back side or your body.   If you see any new or changing lesions before your next follow-up, please call to schedule a visit.  Please continue daily skin protection including broad spectrum sunscreen SPF 30+ to sun-exposed areas,  reapplying every 2 hours as needed when you're outdoors.   Staying in the shade or wearing long sleeves, sun glasses (UVA+UVB protection) and wide brim hats (4-inch brim around the entire circumference of the hat) are also recommended for sun protection.       Due to recent changes in healthcare laws, you may see results of your pathology and/or laboratory studies on MyChart before the doctors have had a chance to review them. We understand that in some cases there may be results that are confusing or concerning to you. Please understand that not all results are received at the same time and often the doctors may need to interpret multiple results in order to provide you with the best plan of care or course of treatment. Therefore, we ask that you please give us 2 business days to thoroughly review all your results before contacting the office for clarification. Should we see a critical lab result, you will be contacted sooner.   If You Need Anything After Your Visit  If you have any questions or concerns for your doctor, please call our main line at [phone] and press option 4 to reach your doctor's medical assistant. If no one answers, please leave a voicemail as directed and we will return your call as soon as possible. Messages left after 4 pm will be answered the following business day.   You may also send us a message   via MyChart. We typically respond to MyChart messages within 1-2 business days.  For prescription refills, please ask your pharmacy to contact our office. Our fax number is [phone].  If you have an urgent issue when the clinic is closed that cannot wait until the next business day, you can page your doctor at the number below.    Please note that while we do our best to be available for urgent issues outside of office hours, we are not available 24/7.   If you have an urgent issue and are unable to reach us, you may choose to seek medical care at your doctor's  office, retail clinic, urgent care center, or emergency room.  If you have a medical emergency, please immediately call 911 or go to the emergency department.  Pager Numbers  - Dr. Kowalski: [phone]  - Dr. Moye: [phone]  - Dr. Stewart: [phone]  In the event of inclement weather, please call our main line at [phone] for an update on the status of any delays or closures.  Dermatology Medication Tips: Please keep the boxes that topical medications come in in order to help keep track of the instructions about where and how to use these. Pharmacies typically print the medication instructions only on the boxes and not directly on the medication tubes.   If your medication is too expensive, please contact our office at [phone] option 4 or send us a message through MyChart.   We are unable to tell what your co-pay for medications will be in advance as this is different depending on your insurance coverage. However, we may be able to find a substitute medication at lower cost or fill out paperwork to get insurance to cover a needed medication.   If a prior authorization is required to get your medication covered by your insurance company, please allow us 1-2 business days to complete this process.  Drug prices often vary depending on where the prescription is filled and some pharmacies may offer cheaper prices.  The website www.goodrx.com contains coupons for medications through different pharmacies. The prices here do not account for what the cost may be with help from insurance (it may be cheaper with your insurance), but the website can give you the price if you did not use any insurance.  - You can print the associated coupon and take it with your prescription to the pharmacy.  - You may also stop by our office during regular business hours and pick up a GoodRx coupon card.  - If you need your prescription sent electronically to a different pharmacy, notify our office  through [facility] MyChart or by phone at [phone] option 4.     Si Usted Necesita Algo Despus de Su Visita  Tambin puede enviarnos un mensaje a travs de MyChart. Por lo general respondemos a los mensajes de MyChart en el transcurso de 1 a 2 das hbiles.  Para renovar recetas, por favor pida a su farmacia que se ponga en contacto con nuestra oficina. Nuestro nmero de fax es el [phone].  Si tiene un asunto urgente cuando la clnica est cerrada y que no puede esperar hasta el siguiente da hbil, puede llamar/localizar a su doctor(a) al nmero que aparece a continuacin.   Por favor, tenga en cuenta que aunque hacemos todo lo posible para estar disponibles para asuntos urgentes fuera del horario de oficina, no estamos disponibles las 24 horas del da, los 7 das de la semana.   Si tiene un problema urgente y   no puede comunicarse con nosotros, puede optar por buscar atencin mdica  en el consultorio de su doctor(a), en una clnica privada, en un centro de atencin urgente o en una sala de emergencias.  Si tiene una emergencia mdica, por favor llame inmediatamente al 911 o vaya a la sala de emergencias.  Nmeros de bper  - Dr. Kowalski: [phone]  - Dra. Moye: [phone]  - Dra. Stewart: [phone]  En caso de inclemencias del tiempo, por favor llame a nuestra lnea principal al [phone] para una actualizacin sobre el estado de cualquier retraso o cierre.  Consejos para la medicacin en dermatologa: Por favor, guarde las cajas en las que vienen los medicamentos de uso tpico para ayudarle a seguir las instrucciones sobre dnde y cmo usarlos. Las farmacias generalmente imprimen las instrucciones del medicamento slo en las cajas y no directamente en los tubos del medicamento.   Si su medicamento es muy caro, por favor, pngase en contacto con nuestra oficina llamando al [phone] y presione la opcin 4 o envenos un mensaje a travs de MyChart.   No  podemos decirle cul ser su copago por los medicamentos por adelantado ya que esto es diferente dependiendo de la cobertura de su seguro. Sin embargo, es posible que podamos encontrar un medicamento sustituto a menor costo o llenar un formulario para que el seguro cubra el medicamento que se considera necesario.   Si se requiere una autorizacin previa para que su compaa de seguros cubra su medicamento, por favor permtanos de 1 a 2 das hbiles para completar este proceso.  Los precios de los medicamentos varan con frecuencia dependiendo del lugar de dnde se surte la receta y alguna farmacias pueden ofrecer precios ms baratos.  El sitio web www.goodrx.com tiene cupones para medicamentos de diferentes farmacias. Los precios aqu no tienen en cuenta lo que podra costar con la ayuda del seguro (puede ser ms barato con su seguro), pero el sitio web puede darle el precio si no utiliz ningn seguro.  - Puede imprimir el cupn correspondiente y llevarlo con su receta a la farmacia.  - Tambin puede pasar por nuestra oficina durante el horario de atencin regular y recoger una tarjeta de cupones de GoodRx.  - Si necesita que su receta se enve electrnicamente a una farmacia diferente, informe a nuestra oficina a travs de MyChart de [facility] o por telfono llamando al [phone] y presione la opcin 4.  

## 2022-05-11 ENCOUNTER — Encounter: Payer: Self-pay | Admitting: Dermatology

## 2022-07-07 DIAGNOSIS — H6123 Impacted cerumen, bilateral: Secondary | ICD-10-CM | POA: Diagnosis not present

## 2022-07-11 ENCOUNTER — Other Ambulatory Visit: Payer: Self-pay | Admitting: Family Medicine

## 2022-10-24 ENCOUNTER — Other Ambulatory Visit: Payer: Self-pay | Admitting: Family Medicine

## 2022-10-27 DIAGNOSIS — H6123 Impacted cerumen, bilateral: Secondary | ICD-10-CM | POA: Diagnosis not present

## 2022-11-06 ENCOUNTER — Ambulatory Visit: Payer: Medicare Other | Admitting: Dermatology

## 2022-11-06 ENCOUNTER — Encounter: Payer: Self-pay | Admitting: Dermatology

## 2022-11-06 VITALS — BP 136/61 | HR 67

## 2022-11-06 DIAGNOSIS — L570 Actinic keratosis: Secondary | ICD-10-CM

## 2022-11-06 DIAGNOSIS — L821 Other seborrheic keratosis: Secondary | ICD-10-CM

## 2022-11-06 DIAGNOSIS — Z86007 Personal history of in-situ neoplasm of skin: Secondary | ICD-10-CM | POA: Diagnosis not present

## 2022-11-06 DIAGNOSIS — L578 Other skin changes due to chronic exposure to nonionizing radiation: Secondary | ICD-10-CM

## 2022-11-06 DIAGNOSIS — L814 Other melanin hyperpigmentation: Secondary | ICD-10-CM | POA: Diagnosis not present

## 2022-11-06 NOTE — Patient Instructions (Addendum)
Cryotherapy Aftercare  Wash gently with soap and water everyday.   Apply Vaseline and Band-Aid daily until healed.     Due to recent changes in healthcare laws, you may see results of your pathology and/or laboratory studies on MyChart before the doctors have had a chance to review them. We understand that in some cases there may be results that are confusing or concerning to you. Please understand that not all results are received at the same time and often the doctors may need to interpret multiple results in order to provide you with the best plan of care or course of treatment. Therefore, we ask that you please give us 2 business days to thoroughly review all your results before contacting the office for clarification. Should we see a critical lab result, you will be contacted sooner.   If You Need Anything After Your Visit  If you have any questions or concerns for your doctor, please call our main line at [phone] and press option 4 to reach your doctor's medical assistant. If no one answers, please leave a voicemail as directed and we will return your call as soon as possible. Messages left after 4 pm will be answered the following business day.   You may also send us a message via MyChart. We typically respond to MyChart messages within 1-2 business days.  For prescription refills, please ask your pharmacy to contact our office. Our fax number is [phone].  If you have an urgent issue when the clinic is closed that cannot wait until the next business day, you can page your doctor at the number below.    Please note that while we do our best to be available for urgent issues outside of office hours, we are not available 24/7.   If you have an urgent issue and are unable to reach us, you may choose to seek medical care at your doctor's office, retail clinic, urgent care center, or emergency room.  If you have a medical emergency, please immediately call 911 or go to the  emergency department.  Pager Numbers  - Dr. Kowalski: [phone]  - Dr. Moye: [phone]  - Dr. Stewart: [phone]  In the event of inclement weather, please call our main line at [phone] for an update on the status of any delays or closures.  Dermatology Medication Tips: Please keep the boxes that topical medications come in in order to help keep track of the instructions about where and how to use these. Pharmacies typically print the medication instructions only on the boxes and not directly on the medication tubes.   If your medication is too expensive, please contact our office at [phone] option 4 or send us a message through MyChart.   We are unable to tell what your co-pay for medications will be in advance as this is different depending on your insurance coverage. However, we may be able to find a substitute medication at lower cost or fill out paperwork to get insurance to cover a needed medication.   If a prior authorization is required to get your medication covered by your insurance company, please allow us 1-2 business days to complete this process.  Drug prices often vary depending on where the prescription is filled and some pharmacies may offer cheaper prices.  The website www.goodrx.com contains coupons for medications through different pharmacies. The prices here do not account for what the cost may be with help from insurance (it may be cheaper with your insurance), but the website can   give you the price if you did not use any insurance.  - You can print the associated coupon and take it with your prescription to the pharmacy.  - You may also stop by our office during regular business hours and pick up a GoodRx coupon card.  - If you need your prescription sent electronically to a different pharmacy, notify our office through [facility] MyChart or by phone at [phone] option 4.     Si Usted Necesita Algo Despus de Su Visita  Tambin puede  enviarnos un mensaje a travs de MyChart. Por lo general respondemos a los mensajes de MyChart en el transcurso de 1 a 2 das hbiles.  Para renovar recetas, por favor pida a su farmacia que se ponga en contacto con nuestra oficina. Nuestro nmero de fax es el [phone].  Si tiene un asunto urgente cuando la clnica est cerrada y que no puede esperar hasta el siguiente da hbil, puede llamar/localizar a su doctor(a) al nmero que aparece a continuacin.   Por favor, tenga en cuenta que aunque hacemos todo lo posible para estar disponibles para asuntos urgentes fuera del horario de oficina, no estamos disponibles las 24 horas del da, los 7 das de la semana.   Si tiene un problema urgente y no puede comunicarse con nosotros, puede optar por buscar atencin mdica  en el consultorio de su doctor(a), en una clnica privada, en un centro de atencin urgente o en una sala de emergencias.  Si tiene una emergencia mdica, por favor llame inmediatamente al 911 o vaya a la sala de emergencias.  Nmeros de bper  - Dr. Kowalski: [phone]  - Dra. Moye: [phone]  - Dra. Stewart: [phone]  En caso de inclemencias del tiempo, por favor llame a nuestra lnea principal al [phone] para una actualizacin sobre el estado de cualquier retraso o cierre.  Consejos para la medicacin en dermatologa: Por favor, guarde las cajas en las que vienen los medicamentos de uso tpico para ayudarle a seguir las instrucciones sobre dnde y cmo usarlos. Las farmacias generalmente imprimen las instrucciones del medicamento slo en las cajas y no directamente en los tubos del medicamento.   Si su medicamento es muy caro, por favor, pngase en contacto con nuestra oficina llamando al [phone] y presione la opcin 4 o envenos un mensaje a travs de MyChart.   No podemos decirle cul ser su copago por los medicamentos por adelantado ya que esto es diferente dependiendo de la cobertura de su seguro.  Sin embargo, es posible que podamos encontrar un medicamento sustituto a menor costo o llenar un formulario para que el seguro cubra el medicamento que se considera necesario.   Si se requiere una autorizacin previa para que su compaa de seguros cubra su medicamento, por favor permtanos de 1 a 2 das hbiles para completar este proceso.  Los precios de los medicamentos varan con frecuencia dependiendo del lugar de dnde se surte la receta y alguna farmacias pueden ofrecer precios ms baratos.  El sitio web www.goodrx.com tiene cupones para medicamentos de diferentes farmacias. Los precios aqu no tienen en cuenta lo que podra costar con la ayuda del seguro (puede ser ms barato con su seguro), pero el sitio web puede darle el precio si no utiliz ningn seguro.  - Puede imprimir el cupn correspondiente y llevarlo con su receta a la farmacia.  - Tambin puede pasar por nuestra oficina durante el horario de atencin regular y recoger una tarjeta de cupones de GoodRx.  -   Si necesita que su receta se enve electrnicamente a una farmacia diferente, informe a nuestra oficina a travs de MyChart de [facility] o por telfono llamando al [phone] y presione la opcin 4.  

## 2022-11-06 NOTE — Progress Notes (Signed)
Follow-Up Visit   Subjective  [name] is a 89 y.o. female who presents for the following: AK f/u (L ankle, L distal calf 64mf/u).  They ask about spots at her leg and spots on her scalp.   Patient accompanied by daughter.  The following portions of the chart were reviewed this encounter and updated as appropriate:   Tobacco  Allergies  Meds  Problems  Med Hx  Surg Hx  Fam Hx      Review of Systems:  No other skin or systemic complaints except as noted in HPI or Assessment and Plan.  Objective  Well appearing patient in no apparent distress; mood and affect are within normal limits.  A focused examination was performed including face, hands, L lower leg,scalp. Relevant physical exam findings are noted in the Assessment and Plan.  L calf x 2, L pretibia x 1, vertex scalp R of midline x 1 (4) Pink scaly macules    Assessment & Plan   Lentigines - Scattered tan macules - Due to sun exposure - Benign-appearing, observe - Recommend daily broad spectrum sunscreen SPF 30+ to sun-exposed areas, reapply every 2 hours as needed. - Call for any changes - legs  Seborrheic Keratoses - Stuck-on, waxy, tan-brown papules and/or plaques  - Benign-appearing - Discussed benign etiology and prognosis. - Observe - Call for any changes - legs  Actinic Damage - chronic, secondary to cumulative UV radiation exposure/sun exposure over time - diffuse scaly erythematous macules with underlying dyspigmentation - Recommend daily broad spectrum sunscreen SPF 30+ to sun-exposed areas, reapply every 2 hours as needed.  - Recommend staying in the shade or wearing long sleeves, sun glasses (UVA+UVB protection) and wide brim hats (4-inch brim around the entire circumference of the hat). - Call for new or changing lesions.   History of Squamous Cell Carcinoma in Situ of the Skin - No evidence of recurrence today - Recommend regular full body skin exams - Recommend daily broad  spectrum sunscreen SPF 30+ to sun-exposed areas, reapply every 2 hours as needed.  - Call if any new or changing lesions are noted between office visits  - mid vertex scalp  AK (actinic keratosis) (4) L calf x 2, L pretibia x 1, vertex scalp R of midline x 1  Actinic keratoses are precancerous spots that appear secondary to cumulative UV radiation exposure/sun exposure over time. They are chronic with expected duration over 1 year. A portion of actinic keratoses will progress to squamous cell carcinoma of the skin. It is not possible to reliably predict which spots will progress to skin cancer and so treatment is recommended to prevent development of skin cancer.  Recommend daily broad spectrum sunscreen SPF 30+ to sun-exposed areas, reapply every 2 hours as needed.  Recommend staying in the shade or wearing long sleeves, sun glasses (UVA+UVB protection) and wide brim hats (4-inch brim around the entire circumference of the hat). Call for new or changing lesions.   Destruction of lesion - L calf x 2, L pretibia x 1, vertex scalp R of midline x 1  Destruction method: cryotherapy   Informed consent: discussed and consent obtained   Lesion destroyed using liquid nitrogen: Yes   Region frozen until ice ball extended beyond lesion: Yes   Outcome: patient tolerated procedure well with no complications   Post-procedure details: wound care instructions given   Additional details:  Prior to procedure, discussed risks of blister formation, small wound, skin dyspigmentation, or rare scar following  cryotherapy. Recommend Vaseline ointment to treated areas while healing.    Return in about 6 months (around 05/07/2023) for TBSE, Hx of AKs, Hx of SCC.  I, Othelia Pulling, RMA, am acting as scribe for Forest Gleason, MD .  Documentation: I have reviewed the above documentation for accuracy and completeness, and I agree with the above.  Forest Gleason, MD

## 2022-12-22 ENCOUNTER — Telehealth: Payer: Self-pay | Admitting: Family Medicine

## 2022-12-22 NOTE — Telephone Encounter (Signed)
Called patient to schedule Medicare Annual Wellness Visit (AWV). Left message for patient to call back and schedule Medicare Annual Wellness Visit (AWV).  Last date of AWV: 01/03/2023  Please schedule an appointment at any time with [facility].  If any questions, please contact me at [phone].  Thank you ,  [facility] Direct Dial: [phone]

## 2022-12-24 ENCOUNTER — Telehealth: Payer: Self-pay | Admitting: Family Medicine

## 2022-12-24 NOTE — Telephone Encounter (Signed)
Contacted [name] to schedule their annual wellness visit. Appointment made for 01/28/2023.  *(signature)*

## 2022-12-26 ENCOUNTER — Other Ambulatory Visit: Payer: Self-pay | Admitting: Family Medicine

## 2023-01-02 ENCOUNTER — Other Ambulatory Visit: Payer: Self-pay | Admitting: Family Medicine

## 2023-01-06 ENCOUNTER — Encounter: Payer: Medicare Other | Admitting: Family Medicine

## 2023-01-06 ENCOUNTER — Encounter: Payer: Self-pay | Admitting: Family Medicine

## 2023-01-22 ENCOUNTER — Telehealth: Payer: Self-pay | Admitting: Family Medicine

## 2023-01-22 NOTE — Telephone Encounter (Signed)
Called patient to schedule Medicare Annual Wellness Visit (AWV). Left message for patient to call back and schedule Medicare Annual Wellness Visit (AWV).  Last date of AWV: 01/02/2022  Please schedule an appointment at any time with [facility].  If any questions, please contact me at [phone].  Thank you ,  Bernice Cicero Care Guide [facility] AWV TEAM Direct Dial: [phone]    

## 2023-01-28 ENCOUNTER — Ambulatory Visit: Payer: Medicare Other

## 2023-01-30 ENCOUNTER — Encounter: Payer: Self-pay | Admitting: Family Medicine

## 2023-01-30 ENCOUNTER — Ambulatory Visit (INDEPENDENT_AMBULATORY_CARE_PROVIDER_SITE_OTHER): Payer: Medicare Other | Admitting: Family Medicine

## 2023-01-30 VITALS — BP 130/70 | HR 65 | Temp 98.3°F | Ht 64.0 in | Wt 153.4 lb

## 2023-01-30 DIAGNOSIS — R1032 Left lower quadrant pain: Secondary | ICD-10-CM

## 2023-01-30 LAB — POC URINALSYSI DIPSTICK (AUTOMATED)
Bilirubin, UA: NEGATIVE
Blood, UA: NEGATIVE
Glucose, UA: NEGATIVE
Ketones, UA: NEGATIVE
Leukocytes, UA: NEGATIVE
Nitrite, UA: NEGATIVE
Protein, UA: NEGATIVE
Spec Grav, UA: 1.02 (ref 1.010–1.025)
Urobilinogen, UA: 0.2 E.U./dL
pH, UA: 6 (ref 5.0–8.0)

## 2023-01-30 MED ORDER — TRAMADOL HCL 50 MG PO TABS: 50.0000 mg | ORAL_TABLET | Freq: Two times a day (BID) | ORAL | 0 refills | Status: AC | PRN

## 2023-01-30 MED ORDER — AMOXICILLIN-POT CLAVULANATE 875-125 MG PO TABS: 1.0000 | ORAL_TABLET | Freq: Two times a day (BID) | ORAL | 0 refills | Status: DC

## 2023-01-30 NOTE — Patient Instructions (Signed)
Start clear liquids, bowel rest and start Miralax 17 gm daily for possible constipation ... if symptoms not improving start antibiotics..  Go to ER for severe pain, call if not improving by early next week for further evaluation.. possible CT scan.

## 2023-01-30 NOTE — Assessment & Plan Note (Addendum)
Acute, differential includes constipation versus diverticulitis versus urinary issue. No clear association with joint.  No clear sign of gastroenteritis.  Will evaluate with urinalysis: No sign of blood, infection or stone  Given no bowel movement in the last 2 days constipation is a possibility as source of her pain.  I encouraged her to start MiraLAX Concern for acute diverticulitis, no red flags and no clear indication for urgent treatment.  Recommended clear liquids but if not improving as expected over the next 24 to 48 hours she can start a prescription for Augmentin 1 tablet twice daily x 10 days.  Encouraged increased fluids and daughter will call with an update early next week. Return and ER precautions provided

## 2023-01-30 NOTE — Progress Notes (Signed)
Patient ID: [name], female    DOB: Aug 05, 1925, 89 y.o.   MRN: [id number]  This visit was conducted in person.  BP 130/70   Pulse 65   Temp 98.3 F (36.8 C) (Temporal)   Ht  (1.626 m)   Wt 153 lb 6 oz (69.6 kg)   SpO2 98%   BMI 26.33 kg/m    CC:  Chief Complaint  Patient presents with   LLQ Pain    Gotten worse of the last couple of days     Subjective:   HPI: [name] is a 89 y.o. female presenting on 01/30/2023 for LLQ Pain (Gotten worse of the last couple of days/)  New onset pain in LLQ, intermittently  in last several weeks, mild  Now worse in the last 2 days.. pain is more severe now. 8-9/10, now currently pain is 5-6 /10 after the tramadol   IN LLQ pain radiates to left buttock.  BMs  every few days... last BM 2 days ago... no straining.  No blood in stool.  No N/V/D. No fever   Occ mild dysuria.Marland Kitchen no change in urgency or frequency.    Improved some with tramadol     In 10/2021 left flank pain eval with CT... constipation 2009 colonoscopy showed severe sigmoid diverticulosis.   Relevant past medical, surgical, family and social history reviewed and updated as indicated. Interim medical history since our last visit reviewed. Allergies and medications reviewed and updated. Outpatient Medications Prior to Visit  Medication Sig Dispense Refill   albuterol (VENTOLIN HFA) 108 (90 Base) MCG/ACT inhaler INHALE 2 PUFFS BY MOUTH EVERY 6 HOURS AS NEEDED FOR WHEEZE OR SHORTNESS OF BREATH 18 each 2   aspirin 81 MG tablet Take 81 mg by mouth daily.     colchicine 0.6 MG tablet 1 tab po x 1 then repeat in 2 hours, continue daily until flare resolved. 30 tablet 1   hydroxypropyl methylcellulose (ISOPTO TEARS) 2.5 % ophthalmic solution Place 1 drop into both eyes 3 (three) times daily as needed (dry eyes).     lisinopril-hydrochlorothiazide (ZESTORETIC) 20-12.5 MG tablet TAKE 2 TABLETS BY MOUTH DAILY 200 tablet 0   metoprolol tartrate (LOPRESSOR) 25  MG tablet TAKE 1 TABLET BY MOUTH TWICE  DAILY WITH  TABLET FOR A  TOTAL DAILY DOSE OF  TWICE  DAILY 200 tablet 0   metoprolol tartrate (LOPRESSOR) 50 MG tablet TAKE 1 TABLET BY MOUTH TWICE  DAILY (TAKE WITH  TABLET FOR A TOTAL OF  TWICE DAILY) 200 tablet 0   Multiple Vitamin (MULTIVITAMIN) tablet Take 1 tablet by mouth daily.     mupirocin ointment (BACTROBAN) 2 % Apply 1 application topically 3 (three) times daily. 22 g 0   neomycin-polymyxin b-dexamethasone (MAXITROL) 3.5-10000-0.1 OINT      omeprazole (PRILOSEC) 40 MG capsule TAKE 1 CAPSULE BY MOUTH DAILY 100 capsule 0   OVER THE COUNTER MEDICATION I caps 1 tab bid     polyethylene glycol (MIRALAX / GLYCOLAX) 17 g packet Take 17 g by mouth daily. 5 each 0   sertraline (ZOLOFT) 50 MG tablet TAKE 1 TABLET BY MOUTH DAILY AT  BEDTIME 100 tablet 0   fish oil-omega-3 fatty acids 1000 MG capsule Take 2 g by mouth 2 (two) times daily.      Glucosamine Sulfate 500 MG CAPS Take 500 mg by mouth 2 (two) times daily.     promethazine-dextromethorphan (PROMETHAZINE-DM) 6.25-15 MG/5ML syrup Take 5 mLs by mouth 4 (  four) times daily as needed for cough. 118 mL 0   scopolamine (TRANSDERM-SCOP) 1 MG/3DAYS Place 1 patch (1.5 mg total) onto the skin every 3 (three) days. 2 patch 0   No facility-administered medications prior to visit.     Per HPI unless specifically indicated in ROS section below Review of Systems  Constitutional:  Negative for fatigue and fever.  HENT:  Negative for congestion.   Eyes:  Negative for pain.  Respiratory:  Negative for cough and shortness of breath.   Cardiovascular:  Negative for chest pain, palpitations and leg swelling.  Gastrointestinal:  Positive for abdominal pain.  Genitourinary:  Negative for dysuria and vaginal bleeding.  Musculoskeletal:  Negative for back pain.  Neurological:  Negative for syncope, light-headedness and headaches.  Psychiatric/Behavioral:  Negative for dysphoric mood.     Objective:  BP 130/70   Pulse 65   Temp 98.3 F (36.8 C) (Temporal)   Ht  (1.626 m)   Wt 153 lb 6 oz (69.6 kg)   SpO2 98%   BMI 26.33 kg/m   Wt Readings from Last 3 Encounters:  01/30/23 153 lb 6 oz (69.6 kg)  01/02/22 150 lb (68 kg)  12/09/21 154 lb 3 oz (69.9 kg)      Physical Exam Constitutional:      General: She is not in acute distress.    Appearance: Normal appearance. She is well-developed. She is not ill-appearing or toxic-appearing.  HENT:     Head: Normocephalic.     Right Ear: Hearing, tympanic membrane, ear canal and external ear normal. Tympanic membrane is not erythematous, retracted or bulging.     Left Ear: Hearing, tympanic membrane, ear canal and external ear normal. Tympanic membrane is not erythematous, retracted or bulging.     Nose: No mucosal edema or rhinorrhea.     Right Sinus: No maxillary sinus tenderness or frontal sinus tenderness.     Left Sinus: No maxillary sinus tenderness or frontal sinus tenderness.     Mouth/Throat:     Pharynx: Uvula midline.  Eyes:     General: Lids are normal. Lids are everted, no foreign bodies appreciated.     Conjunctiva/sclera: Conjunctivae normal.     Pupils: Pupils are equal, round, and reactive to light.  Neck:     Thyroid: No thyroid mass or thyromegaly.     Vascular: No carotid bruit.     Trachea: Trachea normal.  Cardiovascular:     Rate and Rhythm: Normal rate and regular rhythm.     Pulses: Normal pulses.     Heart sounds: Normal heart sounds, S1 normal and S2 normal. No murmur heard.    No friction rub. No gallop.  Pulmonary:     Effort: Pulmonary effort is normal. No tachypnea or respiratory distress.     Breath sounds: Normal breath sounds. No decreased breath sounds, wheezing, rhonchi or rales.  Abdominal:     General: Bowel sounds are normal.     Palpations: Abdomen is soft.     Tenderness: There is abdominal tenderness in the left lower quadrant.  Musculoskeletal:     Cervical back:  Normal range of motion and neck supple.  Skin:    General: Skin is warm and dry.     Findings: No rash.  Neurological:     Mental Status: She is alert.  Psychiatric:        Mood and Affect: Mood is not anxious or depressed.        Speech: Speech  normal.        Behavior: Behavior normal. Behavior is cooperative.        Thought Content: Thought content normal.        Judgment: Judgment normal.       Results for orders placed or performed in visit on 01/30/23  POCT Urinalysis Dipstick (Automated)  Result Value Ref Range   Color, UA Yellow    Clarity, UA Hazy    Glucose, UA Negative Negative   Bilirubin, UA Negative    Ketones, UA Negative    Spec Grav, UA 1.020 1.010 - 1.025   Blood, UA Negative    pH, UA 6.0 5.0 - 8.0   Protein, UA Negative Negative   Urobilinogen, UA 0.2 0.2 or 1.0 E.U./dL   Nitrite, UA Negative    Leukocytes, UA Negative Negative    Assessment and Plan  LLQ pain Assessment & Plan: Acute, differential includes constipation versus diverticulitis versus urinary issue. No clear association with joint.  No clear sign of gastroenteritis.  Will evaluate with urinalysis: No sign of blood, infection or stone  Given no bowel movement in the last 2 days constipation is a possibility as source of her pain.  I encouraged her to start MiraLAX Concern for acute diverticulitis, no red flags and no clear indication for urgent treatment.  Recommended clear liquids but if not improving as expected over the next 24 to 48 hours she can start a prescription for Augmentin 1 tablet twice daily x 10 days.  Encouraged increased fluids and daughter will call with an update early next week. Return and ER precautions provided  Orders: -     POCT Urinalysis Dipstick (Automated)  Other orders -     traMADol HCl; Take 1 tablet (50 mg total) by mouth every 12 (twelve) hours as needed for up to 5 days for moderate pain.  Dispense: 15 tablet; Refill: 0 -     Amoxicillin-Pot  Clavulanate; Take 1 tablet by mouth 2 (two) times daily. Fill if abdominal pain not improving with miralax  or bowel rest  Dispense: 20 tablet; Refill: 0    No follow-ups on file.   Kerby Nora, MD

## 2023-03-06 ENCOUNTER — Other Ambulatory Visit: Payer: Self-pay | Admitting: Family Medicine

## 2023-03-09 NOTE — Telephone Encounter (Signed)
Please call daughter Shaquay Perlstein [phone] to try and schedule MWV with nurse and CPE with fasting labs prior with Dr. Ermalene Searing.

## 2023-03-09 NOTE — Telephone Encounter (Signed)
Lvmtcb, no mychart set up to send message  

## 2023-03-10 ENCOUNTER — Telehealth: Payer: Self-pay | Admitting: *Deleted

## 2023-03-10 DIAGNOSIS — E559 Vitamin D deficiency, unspecified: Secondary | ICD-10-CM

## 2023-03-10 DIAGNOSIS — E782 Mixed hyperlipidemia: Secondary | ICD-10-CM

## 2023-03-10 DIAGNOSIS — M109 Gout, unspecified: Secondary | ICD-10-CM

## 2023-03-10 DIAGNOSIS — R7303 Prediabetes: Secondary | ICD-10-CM

## 2023-03-10 NOTE — Telephone Encounter (Signed)
-----   Message from Alvina Chou sent at 03/10/2023  9:16 AM EDT ----- Regarding: Lab orders for Wednesday, 5.29.24 Patient is scheduled for CPX labs, please order future labs, Thanks , Camelia Eng

## 2023-03-18 ENCOUNTER — Other Ambulatory Visit (INDEPENDENT_AMBULATORY_CARE_PROVIDER_SITE_OTHER): Payer: Medicare HMO

## 2023-03-18 DIAGNOSIS — R7303 Prediabetes: Secondary | ICD-10-CM | POA: Diagnosis not present

## 2023-03-18 DIAGNOSIS — E782 Mixed hyperlipidemia: Secondary | ICD-10-CM

## 2023-03-18 DIAGNOSIS — E559 Vitamin D deficiency, unspecified: Secondary | ICD-10-CM

## 2023-03-18 DIAGNOSIS — M109 Gout, unspecified: Secondary | ICD-10-CM

## 2023-03-18 LAB — COMPREHENSIVE METABOLIC PANEL
ALT: 8 U/L (ref 0–35)
AST: 12 U/L (ref 0–37)
Albumin: 3.6 g/dL (ref 3.5–5.2)
Alkaline Phosphatase: 56 U/L (ref 39–117)
BUN: 34 mg/dL — ABNORMAL HIGH (ref 6–23)
CO2: 25 mEq/L (ref 19–32)
Calcium: 9.4 mg/dL (ref 8.4–10.5)
Chloride: 104 mEq/L (ref 96–112)
Creatinine, Ser: 1.06 mg/dL (ref 0.40–1.20)
GFR: 44.67 mL/min — ABNORMAL LOW (ref >60.00)
Glucose, Bld: 98 mg/dL (ref 70–99)
Potassium: 3.8 mEq/L (ref 3.5–5.1)
Sodium: 138 mEq/L (ref 135–145)
Total Bilirubin: 0.3 mg/dL (ref 0.2–1.2)
Total Protein: 7.1 g/dL (ref 6.0–8.3)

## 2023-03-18 LAB — LDL CHOLESTEROL, DIRECT: Direct LDL: 116 mg/dL

## 2023-03-18 LAB — LIPID PANEL
Cholesterol: 188 mg/dL (ref 0–200)
HDL: 33.2 mg/dL — ABNORMAL LOW (ref >39.00)
NonHDL: 154.51
Total CHOL/HDL Ratio: 6
Triglycerides: 218 mg/dL — ABNORMAL HIGH (ref 0.0–149.0)
VLDL: 43.6 mg/dL — ABNORMAL HIGH (ref 0.0–40.0)

## 2023-03-18 LAB — HEMOGLOBIN A1C: Hgb A1c MFr Bld: 6.3 % (ref 4.6–6.5)

## 2023-03-18 LAB — URIC ACID: Uric Acid, Serum: 8.5 mg/dL — ABNORMAL HIGH (ref 2.4–7.0)

## 2023-03-18 LAB — VITAMIN D 25 HYDROXY (VIT D DEFICIENCY, FRACTURES): VITD: 38.29 ng/mL (ref 30.00–100.00)

## 2023-03-19 NOTE — Progress Notes (Signed)
No critical labs need to be addressed urgently. We will discuss labs in detail at upcoming office visit.   

## 2023-03-27 ENCOUNTER — Ambulatory Visit: Payer: Medicare HMO | Admitting: Family Medicine

## 2023-03-27 ENCOUNTER — Encounter: Payer: Self-pay | Admitting: Family Medicine

## 2023-03-27 VITALS — BP 130/64 | HR 66 | Temp 97.5°F | Resp 16 | Ht 63.75 in | Wt 148.0 lb

## 2023-03-27 DIAGNOSIS — I700 Atherosclerosis of aorta: Secondary | ICD-10-CM

## 2023-03-27 DIAGNOSIS — Z0000 Encounter for general adult medical examination without abnormal findings: Secondary | ICD-10-CM | POA: Diagnosis not present

## 2023-03-27 DIAGNOSIS — R7303 Prediabetes: Secondary | ICD-10-CM

## 2023-03-27 DIAGNOSIS — N1831 Chronic kidney disease, stage 3a: Secondary | ICD-10-CM | POA: Diagnosis not present

## 2023-03-27 DIAGNOSIS — M8580 Other specified disorders of bone density and structure, unspecified site: Secondary | ICD-10-CM | POA: Diagnosis not present

## 2023-03-27 DIAGNOSIS — F320 Major depressive disorder, single episode, mild: Secondary | ICD-10-CM

## 2023-03-27 DIAGNOSIS — E782 Mixed hyperlipidemia: Secondary | ICD-10-CM | POA: Diagnosis not present

## 2023-03-27 DIAGNOSIS — K2210 Ulcer of esophagus without bleeding: Secondary | ICD-10-CM

## 2023-03-27 DIAGNOSIS — M1A9XX0 Chronic gout, unspecified, without tophus (tophi): Secondary | ICD-10-CM | POA: Diagnosis not present

## 2023-03-27 DIAGNOSIS — I10 Essential (primary) hypertension: Secondary | ICD-10-CM | POA: Diagnosis not present

## 2023-03-27 NOTE — Assessment & Plan Note (Signed)
Chronic Stable  diet control.

## 2023-03-27 NOTE — Assessment & Plan Note (Signed)
Chronic, Stable control on sertraline 50 mg daily

## 2023-03-27 NOTE — Assessment & Plan Note (Signed)
Due for re-evaluation with DEXA. ?

## 2023-03-27 NOTE — Assessment & Plan Note (Signed)
Above goal but will not treat aggressively with statin given age. 

## 2023-03-27 NOTE — Assessment & Plan Note (Signed)
Chronic, stable GFR 44-57  Current water intake.

## 2023-03-27 NOTE — Assessment & Plan Note (Signed)
Not on statin given age 

## 2023-03-27 NOTE — Progress Notes (Signed)
Patient ID: [name], female    DOB: 11-20-1924, 89 y.o.   MRN: [id number]  This visit was conducted in person.  BP 130/64   Pulse 66   Temp (!) 97.5 F (36.4 C) (Temporal)   Resp 16   Ht 5' 3.75" (1.619 m)   Wt 148 lb (67.1 kg)   SpO2 96%   BMI 25.60 kg/m    CC:  Chief Complaint  Patient presents with   Annual Exam    Subjective:   HPI: [name] is a 89 y.o. female presenting on 03/27/2023 for Annual Exam  The patient presents for annual medicare wellness, complete physical and review of chronic health problems. He/She also has the following acute concerns today: fatigue.  I have personally reviewed the Medicare Annual Wellness questionnaire and have noted 1. The patient's medical and social history 2. Their use of alcohol, tobacco or illicit drugs 3. Their current medications and supplements 4. The patient's functional ability including ADL's, fall risks, home safety risks and hearing or visual             impairment. 5. Diet and physical activities 6. Evidence for depression or mood disorders 7.         Updated provider list Cognitive evaluation was performed and recorded on pt medicare questionnaire form. The patients weight, height, BMI and visual acuity have been recorded in the chart   I have made referrals, counseling and provided education to the patient based review of the above and I have provided the pt with a written personalized care plan for preventive services.    Documentation of this information was scanned into the electronic record under the media tab.   Advance directives and end of life planning reviewed in detail with patient and documented in EMR. Patient given handout on advance care directives if needed. HCPOA and living will updated if needed.  No falls in last 12 months.  Screening hearing and vision performed  if needed and documented   [employer] Visit from 03/27/2023 in [facility]  PHQ-2 Total Score 3      Hypertension: At goal on Lisinopril/hydrochlorothiazide 20/12.5 mg daily, metoprolol 25 mg 1 tablet twice daily along with an additional 50 mg 1 tablet twice daily for total of 75 mg twice daily.  BP Readings from Last 3 Encounters:  03/27/23 130/64  01/30/23 130/70  11/06/22 136/61  Using medication without problems or lightheadedness:  none Chest pain with exertion: none Edema:none Short of breath:  back at baseline, [place] Average home BPs: not checking Other issues:  Prediabetes   Lab Results  Component Value Date   HGBA1C 6.3 03/18/2023     Elevated Cholesterol: Lab Results  Component Value Date   CHOL 188 03/18/2023   HDL 33.20 (L) 03/18/2023   LDLCALC 146 (H) 06/23/2017   LDLDIRECT 116.0 03/18/2023   TRIG 218.0 (H) 03/18/2023   CHOLHDL 6 03/18/2023  Using medications without problems: Muscle aches:  Diet compliance: moderate Exercise:limited Other complaints:   MDD..  Stable control on sertraline 50 mg p.o. daily    GOUT: Has some mild constant pain in right ankle that she associate with gout, not severe falre as it has been at time. but uric acid increased over the last year from 7.5 up to 8.5  She is on  lisinopril HCTZ and has CKD.     Relevant past medical, surgical, family and social history reviewed and updated  as indicated. Interim medical history since our last visit reviewed. Allergies and medications reviewed and updated. Outpatient Medications Prior to Visit  Medication Sig Dispense Refill   albuterol (VENTOLIN HFA) 108 (90 Base) MCG/ACT inhaler INHALE 2 PUFFS BY MOUTH EVERY 6 HOURS AS NEEDED FOR WHEEZE OR SHORTNESS OF BREATH 18 each 2   aspirin 81 MG tablet Take 81 mg by mouth daily.     colchicine 0.6 MG tablet 1 tab po x 1 then repeat in 2 hours, continue daily until flare resolved. 30 tablet 1   hydroxypropyl methylcellulose (ISOPTO TEARS) 2.5 % ophthalmic solution Place 1 drop into both eyes 3 (three) times  daily as needed (dry eyes).     lisinopril-hydrochlorothiazide (ZESTORETIC) 20-12.5 MG tablet TAKE 2 TABLETS BY MOUTH DAILY 200 tablet 0   metoprolol tartrate (LOPRESSOR) 25 MG tablet TAKE 1 TABLET BY MOUTH TWICE  DAILY WITH 50 MG TABLET FOR A  TOTAL DAILY DOSE OF 75 MG TWICE  DAILY 200 tablet 0   metoprolol tartrate (LOPRESSOR) 50 MG tablet TAKE 1 TABLET BY MOUTH TWICE  DAILY (TAKE WITH 25MG  TABLET FOR A TOTAL OF 75MG  TWICE DAILY) 200 tablet 0   Multiple Vitamin (MULTIVITAMIN) tablet Take 1 tablet by mouth daily.     mupirocin ointment (BACTROBAN) 2 % Apply 1 application topically 3 (three) times daily. 22 g 0   neomycin-polymyxin b-dexamethasone (MAXITROL) 3.5-10000-0.1 OINT      omeprazole (PRILOSEC) 40 MG capsule TAKE 1 CAPSULE BY MOUTH DAILY 100 capsule 0   OVER THE COUNTER MEDICATION I caps 1 tab bid     polyethylene glycol (MIRALAX / GLYCOLAX) 17 g packet Take 17 g by mouth daily. 5 each 0   sertraline (ZOLOFT) 50 MG tablet TAKE 1 TABLET BY MOUTH DAILY AT  BEDTIME 100 tablet 0   amoxicillin-clavulanate (AUGMENTIN) 875-125 MG tablet Take 1 tablet by mouth 2 (two) times daily. Fill if abdominal pain not improving with miralax  or bowel rest (Patient not taking: Reported on 03/27/2023) 20 tablet 0   No facility-administered medications prior to visit.     Per HPI unless specifically indicated in ROS section below Review of Systems  Constitutional:  Negative for fatigue and fever.  HENT:  Negative for congestion.   Eyes:  Negative for pain.  Respiratory:  Negative for cough and shortness of breath.   Cardiovascular:  Negative for chest pain, palpitations and leg swelling.  Gastrointestinal:  Negative for abdominal pain.  Genitourinary:  Negative for dysuria and vaginal bleeding.  Musculoskeletal:  Negative for back pain.  Neurological:  Negative for syncope, light-headedness and headaches.  Psychiatric/Behavioral:  Negative for dysphoric mood.    Objective:  BP 130/64   Pulse 66    Temp (!) 97.5 F (36.4 C) (Temporal)   Resp 16   Ht 5' 3.75" (1.619 m)   Wt 148 lb (67.1 kg)   SpO2 96%   BMI 25.60 kg/m   Wt Readings from Last 3 Encounters:  03/27/23 148 lb (67.1 kg)  01/30/23 153 lb 6 oz (69.6 kg)  01/02/22 150 lb (68 kg)      Physical Exam Constitutional:      General: She is not in acute distress.    Appearance: Normal appearance. She is well-developed. She is not ill-appearing or toxic-appearing.  HENT:     Head: Normocephalic.     Right Ear: Hearing, tympanic membrane, ear canal and external ear normal. Tympanic membrane is not erythematous, retracted or bulging.     Left  Ear: Hearing, tympanic membrane, ear canal and external ear normal. Tympanic membrane is not erythematous, retracted or bulging.     Nose: No mucosal edema or rhinorrhea.     Right Sinus: No maxillary sinus tenderness or frontal sinus tenderness.     Left Sinus: No maxillary sinus tenderness or frontal sinus tenderness.     Mouth/Throat:     Pharynx: Uvula midline.  Eyes:     General: Lids are normal. Lids are everted, no foreign bodies appreciated.     Conjunctiva/sclera: Conjunctivae normal.     Pupils: Pupils are equal, round, and reactive to light.  Neck:     Thyroid: No thyroid mass or thyromegaly.     Vascular: No carotid bruit.     Trachea: Trachea normal.  Cardiovascular:     Rate and Rhythm: Normal rate and regular rhythm.     Pulses: Normal pulses.     Heart sounds: Normal heart sounds, S1 normal and S2 normal. No murmur heard.    No friction rub. No gallop.  Pulmonary:     Effort: Pulmonary effort is normal. No tachypnea or respiratory distress.     Breath sounds: Normal breath sounds. No decreased breath sounds, wheezing, rhonchi or rales.  Abdominal:     General: Bowel sounds are normal.     Palpations: Abdomen is soft.     Tenderness: There is no abdominal tenderness.  Musculoskeletal:     Cervical back: Normal range of motion and neck supple.  Skin:     General: Skin is warm and dry.     Findings: No rash.  Neurological:     Mental Status: She is alert.  Psychiatric:        Mood and Affect: Mood is not anxious or depressed.        Speech: Speech normal.        Behavior: Behavior normal. Behavior is cooperative.        Thought Content: Thought content normal.        Judgment: Judgment normal.       Results for orders placed or performed in visit on 03/18/23  VITAMIN D 25 Hydroxy (Vit-D Deficiency, Fractures)  Result Value Ref Range   VITD 38.29 30.00 - 100.00 ng/mL  Uric Acid  Result Value Ref Range   Uric Acid, Serum 8.5 (H) 2.4 - 7.0 mg/dL  Hemoglobin W0J  Result Value Ref Range   Hgb A1c MFr Bld 6.3 4.6 - 6.5 %  Lipid panel  Result Value Ref Range   Cholesterol 188 0 - 200 mg/dL   Triglycerides 811.9 (H) 0.0 - 149.0 mg/dL   HDL 14.78 (L) >29.56 mg/dL   VLDL 21.3 (H) 0.0 - 08.6 mg/dL   Total CHOL/HDL Ratio 6    NonHDL 154.51   Comprehensive metabolic panel  Result Value Ref Range   Sodium 138 135 - 145 mEq/L   Potassium 3.8 3.5 - 5.1 mEq/L   Chloride 104 96 - 112 mEq/L   CO2 25 19 - 32 mEq/L   Glucose, Bld 98 70 - 99 mg/dL   BUN 34 (H) 6 - 23 mg/dL   Creatinine, Ser 5.78 0.40 - 1.20 mg/dL   Total Bilirubin 0.3 0.2 - 1.2 mg/dL   Alkaline Phosphatase 56 39 - 117 U/L   AST 12 0 - 37 U/L   ALT 8 0 - 35 U/L   Total Protein 7.1 6.0 - 8.3 g/dL   Albumin 3.6 3.5 - 5.2 g/dL   GFR 46.96 (L) >  60.00 mL/min   Calcium 9.4 8.4 - 10.5 mg/dL  LDL cholesterol, direct  Result Value Ref Range   Direct LDL 116.0 mg/dL    This visit occurred during the SARS-CoV-2 public health emergency.  Safety protocols were in place, including screening questions prior to the visit, additional usage of staff PPE, and extensive cleaning of exam room while observing appropriate contact time as indicated for disinfecting solutions.   COVID 19 screen:  No recent travel or known exposure to COVID19 The patient denies respiratory symptoms of COVID 19  at this time. The importance of social distancing was discussed today.   Assessment and Plan The patient's preventative maintenance and recommended screening tests for an annual wellness exam were reviewed in full today. Brought up to date unless services declined.  Counselled on the importance of diet, exercise, and its role in overall health and mortality. The patient's FH and SH was reviewed, including their home life, tobacco status, and drug and alcohol status.   Vaccines uptodate except consider shingrix ( cost prohibitive).  DEXA next due 2024  Mammogram; wishes to continue, last 04/2022 Colon: not indicated  Problem List Items Addressed This Visit     Aortic atherosclerosis (HCC)    Not on statin given age.      CKD (chronic kidney disease) stage 3, GFR 30-59 ml/min (HCC) (Chronic)    Chronic, stable GFR 44-57  Current water intake.      Depression, major, single episode, mild (HCC) (Chronic)    Chronic, Stable control on sertraline 50 mg daily      Erosive esophagitis (Chronic)    Continue PPI longterm.      Gout, chronic   Relevant Orders   Uric acid   Hyperlipidemia (Chronic)    Above goal but will not treat aggressively with statin given age.      HYPERTENSION, BENIGN (Chronic)    Chronic, stable  Well controlled on lisinopril/hydrochlorothiazide 20/12.5 mg daily, metoprolol 25 mg 1 tablet twice daily along with an additional 50 mg 1 tablet twice daily for total of 75 mg twice daily.      Osteopenia (Chronic)    Due for re-evaluation with DEXA.      Relevant Orders   DG Bone Density   Prediabetes (Chronic)      Chronic Stable  diet control.      Other Visit Diagnoses     Medicare annual wellness visit, subsequent    -  Primary      Orders Placed This Encounter  Procedures   DG Bone Density    Standing Status:   Future    Standing Expiration Date:   03/26/2024    Order Specific Question:   Reason for Exam (SYMPTOM  OR DIAGNOSIS REQUIRED)     Answer:   osteopenia    Order Specific Question:   Preferred imaging location?    Answer[facility]   Uric acid    Standing Status:   Future    Standing Expiration Date:   03/26/2024    Kerby Nora, MD

## 2023-03-27 NOTE — Assessment & Plan Note (Signed)
Chronic, stable  Well controlled on lisinopril/hydrochlorothiazide 20/12.5 mg daily, metoprolol 25 mg 1 tablet twice daily along with an additional 50 mg 1 tablet twice daily for total of 75 mg twice daily.

## 2023-03-27 NOTE — Assessment & Plan Note (Signed)
Continue PPI longterm. 

## 2023-03-27 NOTE — Patient Instructions (Addendum)
Work on low purine diet for gou or gett and uric acid lowering.  Increase water.  IF uric acid not improving in 2-3 months.. we will consider stopping HCTZ. Please call the location of your choice from the menu below to schedule your Mammogram and/or Bone Density appointment.    [facility]   [facility]                      Phone:  [phone] [address]                               [place], [place] [address]                                                             Services: Traditional and 3D Mammogram, Bone Density   [facility]                 Phone: [phone] [address]                                                       [place], [place] [address]    Service: Bone Density ONLY   *this site does NOT perform mammograms  [facility]                        Phone:  [phone] [address]                                  [place], [place] [address]                                            Services:  3D Mammogram and Bone Density

## 2023-04-02 ENCOUNTER — Other Ambulatory Visit: Payer: Self-pay | Admitting: Family Medicine

## 2023-04-02 DIAGNOSIS — Z974 Presence of external hearing-aid: Secondary | ICD-10-CM | POA: Diagnosis not present

## 2023-04-02 DIAGNOSIS — Z1231 Encounter for screening mammogram for malignant neoplasm of breast: Secondary | ICD-10-CM

## 2023-04-02 DIAGNOSIS — H6123 Impacted cerumen, bilateral: Secondary | ICD-10-CM | POA: Diagnosis not present

## 2023-05-05 ENCOUNTER — Ambulatory Visit
Admission: RE | Admit: 2023-05-05 | Discharge: 2023-05-05 | Disposition: A | Discharge status: Home / Self Care | Payer: Medicare HMO | Source: Ambulatory Visit | Attending: Family Medicine | Admitting: Family Medicine

## 2023-05-05 DIAGNOSIS — Z1231 Encounter for screening mammogram for malignant neoplasm of breast: Secondary | ICD-10-CM | POA: Diagnosis not present

## 2023-05-07 ENCOUNTER — Ambulatory Visit: Payer: Medicare Other | Admitting: Dermatology

## 2023-05-12 ENCOUNTER — Other Ambulatory Visit: Payer: Self-pay | Admitting: *Deleted

## 2023-05-12 ENCOUNTER — Telehealth: Payer: Self-pay | Admitting: Family Medicine

## 2023-05-12 DIAGNOSIS — K2210 Ulcer of esophagus without bleeding: Secondary | ICD-10-CM

## 2023-05-12 DIAGNOSIS — F320 Major depressive disorder, single episode, mild: Secondary | ICD-10-CM

## 2023-05-12 DIAGNOSIS — I10 Essential (primary) hypertension: Secondary | ICD-10-CM

## 2023-05-12 MED ORDER — METOPROLOL TARTRATE 50 MG PO TABS
ORAL_TABLET | ORAL | 3 refills | Status: DC
Start: 2023-05-12 — End: 2023-11-26

## 2023-05-12 MED ORDER — METOPROLOL TARTRATE 25 MG PO TABS
ORAL_TABLET | ORAL | 3 refills | Status: DC
Start: 2023-05-12 — End: 2023-11-26

## 2023-05-12 MED ORDER — OMEPRAZOLE 40 MG PO CPDR
40.0000 mg | DELAYED_RELEASE_CAPSULE | Freq: Every day | ORAL | 3 refills | Status: DC
Start: 2023-05-12 — End: 2023-11-26

## 2023-05-12 MED ORDER — LISINOPRIL-HYDROCHLOROTHIAZIDE 20-12.5 MG PO TABS
2.0000 | ORAL_TABLET | Freq: Every day | ORAL | 3 refills | Status: DC
Start: 2023-05-12 — End: 2023-11-26

## 2023-05-12 MED ORDER — SERTRALINE HCL 50 MG PO TABS
ORAL_TABLET | ORAL | 1 refills | Status: DC
Start: 2023-05-12 — End: 2023-11-26

## 2023-05-12 NOTE — Telephone Encounter (Signed)
Noted.  Pharmacy updated in EPIC.

## 2023-05-12 NOTE — Telephone Encounter (Signed)
Pharmacy sent over 5 new prescription request for patient. Placed in Dr. Daphine Deutscher drive. Thank you!

## 2023-05-12 NOTE — Addendum Note (Signed)
Addended by: Damita Lack on: 05/12/2023 03:24 PM   Modules accepted: Orders

## 2023-05-12 NOTE — Telephone Encounter (Signed)
Daughter called in to let Dr Ermalene Searing know that her mom will be using  [facility] - [place], [place] - [address] [facility] [place] [facility] to [facility] Phone: [phone]  Fax: [phone]     for all of her prescriptions going forward. And instructions for easy open lids and script talk would need to be included on prescriptions as well.She said that she would have then fax over request of medications that she is in need of now.

## 2023-05-27 ENCOUNTER — Encounter: Payer: Self-pay | Admitting: Dermatology

## 2023-05-27 ENCOUNTER — Other Ambulatory Visit (INDEPENDENT_AMBULATORY_CARE_PROVIDER_SITE_OTHER): Payer: Medicare HMO

## 2023-05-27 ENCOUNTER — Ambulatory Visit: Payer: Medicare HMO | Admitting: Dermatology

## 2023-05-27 VITALS — BP 130/64

## 2023-05-27 DIAGNOSIS — L821 Other seborrheic keratosis: Secondary | ICD-10-CM | POA: Diagnosis not present

## 2023-05-27 DIAGNOSIS — D485 Neoplasm of uncertain behavior of skin: Secondary | ICD-10-CM | POA: Diagnosis not present

## 2023-05-27 DIAGNOSIS — L570 Actinic keratosis: Secondary | ICD-10-CM

## 2023-05-27 DIAGNOSIS — D1801 Hemangioma of skin and subcutaneous tissue: Secondary | ICD-10-CM | POA: Diagnosis not present

## 2023-05-27 DIAGNOSIS — L578 Other skin changes due to chronic exposure to nonionizing radiation: Secondary | ICD-10-CM

## 2023-05-27 DIAGNOSIS — L814 Other melanin hyperpigmentation: Secondary | ICD-10-CM

## 2023-05-27 DIAGNOSIS — Z1283 Encounter for screening for malignant neoplasm of skin: Secondary | ICD-10-CM | POA: Diagnosis not present

## 2023-05-27 DIAGNOSIS — M1A9XX0 Chronic gout, unspecified, without tophus (tophi): Secondary | ICD-10-CM | POA: Diagnosis not present

## 2023-05-27 DIAGNOSIS — Z85828 Personal history of other malignant neoplasm of skin: Secondary | ICD-10-CM | POA: Diagnosis not present

## 2023-05-27 DIAGNOSIS — W908XXA Exposure to other nonionizing radiation, initial encounter: Secondary | ICD-10-CM | POA: Diagnosis not present

## 2023-05-27 DIAGNOSIS — Z872 Personal history of diseases of the skin and subcutaneous tissue: Secondary | ICD-10-CM

## 2023-05-27 DIAGNOSIS — I872 Venous insufficiency (chronic) (peripheral): Secondary | ICD-10-CM

## 2023-05-27 LAB — URIC ACID: Uric Acid, Serum: 7.3 mg/dL — ABNORMAL HIGH (ref 2.4–7.0)

## 2023-05-27 NOTE — Progress Notes (Signed)
Follow-Up Visit   Subjective  [name] is a 89 y.o. female who presents for the following: Skin Cancer Screening and Full Body Skin Exam  The patient presents for Total-Body Skin Exam (TBSE) for skin cancer screening and mole check. The patient has spots, moles and lesions to be evaluated, some may be new or changing and the patient may have concern these could be cancer.  Patient with hx of SCC and AK's. She does have some swelling at legs L > R and is using something her PCP gave her years ago, possibly [facility].   Patient accompanied by daughter who contributes to history.   The following portions of the chart were reviewed this encounter and updated as appropriate: medications, allergies, medical history  Review of Systems:  No other skin or systemic complaints except as noted in HPI or Assessment and Plan.  Objective  Well appearing patient in no apparent distress; mood and affect are within normal limits.  A full examination was performed including scalp, head, eyes, ears, nose, lips, neck, chest, axillae, abdomen, back, buttocks, bilateral upper extremities, bilateral lower extremities, hands, feet, fingers, toes, fingernails, and toenails. All findings within normal limits unless otherwise noted below.   Relevant physical exam findings are noted in the Assessment and Plan.  right medial forearm Pink slightly scaly macules 1.0 cm     vertex scalp Pink slightly scaly macules 6mm , 9mm       Assessment & Plan   SKIN CANCER SCREENING PERFORMED TODAY.  ACTINIC DAMAGE - Chronic condition, secondary to cumulative UV/sun exposure - diffuse scaly erythematous macules with underlying dyspigmentation - Recommend daily broad spectrum sunscreen SPF 30+ to sun-exposed areas, reapply every 2 hours as needed.  - Staying in the shade or wearing long sleeves, sun glasses (UVA+UVB protection) and wide brim hats (4-inch brim around the entire circumference of the hat) are also  recommended for sun protection.  - Call for new or changing lesions.  LENTIGINES, SEBORRHEIC KERATOSES, HEMANGIOMAS - Benign normal skin lesions - Benign-appearing - Call for any changes  MELANOCYTIC NEVI - Tan-brown and/or pink-flesh-colored symmetric macules and papules - Benign appearing on exam today - Observation - Call clinic for new or changing moles - Recommend daily use of broad spectrum spf 30+ sunscreen to sun-exposed areas.   HISTORY OF SQUAMOUS CELL CARCINOMA OF THE SKIN - No evidence of recurrence today - No lymphadenopathy - Recommend regular full body skin exams - Recommend daily broad spectrum sunscreen SPF 30+ to sun-exposed areas, reapply every 2 hours as needed.  - Call if any new or changing lesions are noted between office visits  HISTORY OF PRECANCEROUS ACTINIC KERATOSIS - site(s) of PreCancerous Actinic Keratosis clear today. - these may recur and new lesions may form requiring treatment to prevent transformation into skin cancer - observe for new or changing spots and contact [facility] for appointment if occur - photoprotection with sun protective clothing; sunglasses and broad spectrum sunscreen with SPF of at least 30 + and frequent self skin exams recommended - yearly exams by a dermatologist recommended for persons with history of PreCancerous Actinic Keratoses  STASIS DERMATITIS Exam: Erythematous, scaly patches involving the ankle and distal lower leg with associated lower leg edema.  Chronic and persistent condition with duration or expected duration over one year. Condition is symptomatic/ bothersome to patient. Not currently at goal.   Stasis in the legs causes chronic leg swelling, which may result in itchy or painful rashes, skin discoloration,  skin texture changes, and sometimes ulceration.  Recommend daily graduated compression hose/stockings- easiest to put on first thing in morning, remove at bedtime.  Elevate legs as much as  possible. Avoid salt/sodium rich foods.  Treatment Plan: Recommend compression socks daily. May continue rx cream she is using or recommend CeraVe cream.     Neoplasm of uncertain behavior of skin (2) right medial forearm  vertex scalp  DDX AK vs early SCC  Given low risk of lesions and discomfort associated with treatment, monitor for changes. Patient will return if lesions rapidly grow or become symptomatic. Photos taken for monitoring  ACTINIC KERATOSIS Exam: Erythematous thin papules/macules with gritty scale at the dorsal hands and forearms b/l  Actinic keratoses are precancerous spots that appear secondary to cumulative UV radiation exposure/sun exposure over time. They are chronic with expected duration over 1 year. A portion of actinic keratoses will progress to squamous cell carcinoma of the skin. It is not possible to reliably predict which spots will progress to skin cancer and so treatment is recommended to prevent development of skin cancer.  Recommend daily broad spectrum sunscreen SPF 30+ to sun-exposed areas, reapply every 2 hours as needed.  Recommend staying in the shade or wearing long sleeves, sun glasses (UVA+UVB protection) and wide brim hats (4-inch brim around the entire circumference of the hat). Call for new or changing lesions.  Treatment Plan: Given low risk of lesions and pain associated with treatment/healing, jointly decided to monitor.  Return in about 6 months (around 11/27/2023) for recheck neoplasms.  Anise Salvo, RMA, am acting as scribe for Elie Goody, MD .   Documentation: I have reviewed the above documentation for accuracy and completeness, and I agree with the above.  Elie Goody, MD

## 2023-05-27 NOTE — Patient Instructions (Signed)
Due to recent changes in healthcare laws, you may see results of your pathology and/or laboratory studies on MyChart before the doctors have had a chance to review them. We understand that in some cases there may be results that are confusing or concerning to you. Please understand that not all results are received at the same time and often the doctors may need to interpret multiple results in order to provide you with the best plan of care or course of treatment. Therefore, we ask that you please give [place] 2 business days to thoroughly review all your results before contacting the office for clarification. Should we see a critical lab result, you will be contacted sooner.   If You Need Anything After Your Visit  If you have any questions or concerns for your doctor, please call our main line at [phone] and press option 4 to reach your doctor's medical assistant. If no one answers, please leave a voicemail as directed and we will return your call as soon as possible. Messages left after 4 pm will be answered the following business day.   You may also send [place] a message via MyChart. We typically respond to MyChart messages within 1-2 business days.  For prescription refills, please ask your pharmacy to contact our office. Our fax number is [phone].  If you have an urgent issue when the clinic is closed that cannot wait until the next business day, you can page your doctor at the number below.    Please note that while we do our best to be available for urgent issues outside of office hours, we are not available 24/7.   If you have an urgent issue and are unable to reach [place], you may choose to seek medical care at your doctor's office, retail clinic, urgent care center, or emergency room.  If you have a medical emergency, please immediately call 911 or go to the emergency department.  Pager Numbers  - Dr. Gwen Pounds: [phone]  - Dr. Roseanne Reno: [phone]  In the event of inclement  weather, please call our main line at [phone] for an update on the status of any delays or closures.  Dermatology Medication Tips: Please keep the boxes that topical medications come in in order to help keep track of the instructions about where and how to use these. Pharmacies typically print the medication instructions only on the boxes and not directly on the medication tubes.   If your medication is too expensive, please contact our office at [phone] option 4 or send [place] a message through MyChart.   We are unable to tell what your co-pay for medications will be in advance as this is different depending on your insurance coverage. However, we may be able to find a substitute medication at lower cost or fill out paperwork to get insurance to cover a needed medication.   If a prior authorization is required to get your medication covered by your insurance company, please allow [place] 1-2 business days to complete this process.  Drug prices often vary depending on where the prescription is filled and some pharmacies may offer cheaper prices.  The website www.goodrx.com contains coupons for medications through different pharmacies. The prices here do not account for what the cost may be with help from insurance (it may be cheaper with your insurance), but the website can give you the price if you did not use any insurance.  - You can print the associated coupon and take it with your prescription to the pharmacy.  -  You may also stop by our office during regular business hours and pick up a GoodRx coupon card.  - If you need your prescription sent electronically to a different pharmacy, notify our office through [facility] or by phone at [phone] option 4.

## 2023-06-18 ENCOUNTER — Ambulatory Visit
Admission: RE | Admit: 2023-06-18 | Discharge: 2023-06-18 | Disposition: A | Discharge status: Home / Self Care | Payer: Medicare HMO | Source: Ambulatory Visit | Attending: Family Medicine | Admitting: Family Medicine

## 2023-06-18 DIAGNOSIS — M8588 Other specified disorders of bone density and structure, other site: Secondary | ICD-10-CM | POA: Diagnosis not present

## 2023-06-18 DIAGNOSIS — E349 Endocrine disorder, unspecified: Secondary | ICD-10-CM | POA: Diagnosis not present

## 2023-06-18 DIAGNOSIS — N958 Other specified menopausal and perimenopausal disorders: Secondary | ICD-10-CM | POA: Diagnosis not present

## 2023-06-18 DIAGNOSIS — M8580 Other specified disorders of bone density and structure, unspecified site: Secondary | ICD-10-CM

## 2023-06-19 ENCOUNTER — Encounter: Payer: Self-pay | Admitting: *Deleted

## 2023-08-03 DIAGNOSIS — H6121 Impacted cerumen, right ear: Secondary | ICD-10-CM | POA: Diagnosis not present

## 2023-11-26 ENCOUNTER — Other Ambulatory Visit (HOSPITAL_COMMUNITY): Payer: Self-pay

## 2023-11-26 ENCOUNTER — Telehealth: Payer: Self-pay | Admitting: Family Medicine

## 2023-11-26 DIAGNOSIS — I10 Essential (primary) hypertension: Secondary | ICD-10-CM

## 2023-11-26 DIAGNOSIS — F320 Major depressive disorder, single episode, mild: Secondary | ICD-10-CM

## 2023-11-26 DIAGNOSIS — J069 Acute upper respiratory infection, unspecified: Secondary | ICD-10-CM

## 2023-11-26 DIAGNOSIS — K2210 Ulcer of esophagus without bleeding: Secondary | ICD-10-CM

## 2023-11-26 MED ORDER — LISINOPRIL-HYDROCHLOROTHIAZIDE 20-12.5 MG PO TABS
2.0000 | ORAL_TABLET | Freq: Every day | ORAL | 1 refills | Status: DC
Start: 2023-11-26 — End: 2024-07-16
  Filled 2023-11-26 – 2023-12-15 (×3): qty 180, 90d supply, fill #0
  Filled 2024-03-21: qty 180, 90d supply, fill #1

## 2023-11-26 MED ORDER — METOPROLOL TARTRATE 25 MG PO TABS
25.0000 mg | ORAL_TABLET | Freq: Two times a day (BID) | ORAL | 1 refills | Status: DC
Start: 2023-11-26 — End: 2024-07-16
  Filled 2023-11-26 – 2023-12-15 (×2): qty 180, 90d supply, fill #0
  Filled 2024-03-21: qty 180, 90d supply, fill #1

## 2023-11-26 MED ORDER — SERTRALINE HCL 50 MG PO TABS
50.0000 mg | ORAL_TABLET | Freq: Every day | ORAL | 1 refills | Status: DC
Start: 2023-11-26 — End: 2024-07-16
  Filled 2023-11-26 – 2023-12-15 (×3): qty 90, 90d supply, fill #0
  Filled 2024-03-21: qty 90, 90d supply, fill #1

## 2023-11-26 MED ORDER — ALBUTEROL SULFATE HFA 108 (90 BASE) MCG/ACT IN AERS
2.0000 | INHALATION_SPRAY | Freq: Four times a day (QID) | RESPIRATORY_TRACT | 2 refills | Status: AC | PRN
Start: 2023-11-26 — End: ?
  Filled 2023-11-26 – 2023-12-15 (×2): qty 6.7, 25d supply, fill #0

## 2023-11-26 MED ORDER — METOPROLOL TARTRATE 50 MG PO TABS
50.0000 mg | ORAL_TABLET | Freq: Two times a day (BID) | ORAL | 1 refills | Status: DC
Start: 2023-11-26 — End: 2024-07-16
  Filled 2023-11-26 – 2023-12-15 (×3): qty 180, 90d supply, fill #0
  Filled 2024-03-21: qty 180, 90d supply, fill #1

## 2023-11-26 MED ORDER — OMEPRAZOLE 40 MG PO CPDR
40.0000 mg | DELAYED_RELEASE_CAPSULE | Freq: Every day | ORAL | 1 refills | Status: DC
Start: 2023-11-26 — End: 2024-07-16
  Filled 2023-11-26 – 2023-12-15 (×3): qty 90, 90d supply, fill #0
  Filled 2024-03-21: qty 90, 90d supply, fill #1

## 2023-11-26 NOTE — Telephone Encounter (Signed)
 Refills sent to [facility] as requested.

## 2023-11-26 NOTE — Telephone Encounter (Signed)
 Copied from CRM [id number]. Topic: Clinical - Medication Question >> Nov 26, 2023  3:52 PM China J wrote: Reason for CRM: Patient is requesting Dr. Avelina to transfer all prescriptions so [facility] due to patients change in insurance companies.

## 2023-11-26 NOTE — Addendum Note (Signed)
 Addended by: Wyn Heater on: 11/26/2023 04:25 PM   Modules accepted: Orders

## 2023-11-27 ENCOUNTER — Other Ambulatory Visit (HOSPITAL_COMMUNITY): Payer: Self-pay

## 2023-12-07 ENCOUNTER — Encounter: Payer: Self-pay | Admitting: Dermatology

## 2023-12-07 ENCOUNTER — Other Ambulatory Visit (HOSPITAL_COMMUNITY): Payer: Self-pay

## 2023-12-07 ENCOUNTER — Ambulatory Visit: Payer: HMO | Admitting: Dermatology

## 2023-12-07 DIAGNOSIS — D044 Carcinoma in situ of skin of scalp and neck: Secondary | ICD-10-CM

## 2023-12-07 DIAGNOSIS — D492 Neoplasm of unspecified behavior of bone, soft tissue, and skin: Secondary | ICD-10-CM

## 2023-12-07 DIAGNOSIS — D099 Carcinoma in situ, unspecified: Secondary | ICD-10-CM

## 2023-12-07 HISTORY — DX: Carcinoma in situ, unspecified: D09.9

## 2023-12-07 NOTE — Patient Instructions (Addendum)
Wound Care Instructions  Cleanse wound gently with shampoo and water once a day then pat dry with clean gauze. Apply a thin coat of Petrolatum (petroleum jelly, "Vaseline") over the wound (unless you have an allergy to this). We recommend that you use a new, sterile tube of Vaseline. Do not pick or remove scabs. Do not remove the yellow or white "healing tissue" from the base of the wound.  Cover the wound with fresh, clean, nonstick gauze and secure with paper tape. You may use Band-Aids in place of gauze and tape if the wound is small enough, but would recommend trimming much of the tape off as there is often too much. Sometimes Band-Aids can irritate the skin.  You should call the office for your biopsy report after 1 week if you have not already been contacted.  If you experience any problems, such as abnormal amounts of bleeding, swelling, significant bruising, significant pain, or evidence of infection, please call the office immediately.  FOR ADULT SURGERY PATIENTS: If you need something for pain relief you may take 1 extra strength Tylenol (acetaminophen) AND 2 Ibuprofen (200mg  each) together every 4 hours as needed for pain. (do not take these if you are allergic to them or if you have a reason you should not take them.) Typically, you may only need pain medication for 1 to 3 days.     Recommend daily broad spectrum sunscreen SPF 30+ to sun-exposed areas, reapply every 2 hours as needed. Call for new or changing lesions.  Staying in the shade or wearing long sleeves, sun glasses (UVA+UVB protection) and wide brim hats (4-inch brim around the entire circumference of the hat) are also recommended for sun protection.      Due to recent changes in healthcare laws, you may see results of your pathology and/or laboratory studies on MyChart before the doctors have had a chance to review them. We understand that in some cases there may be results that are confusing or concerning to you. Please  understand that not all results are received at the same time and often the doctors may need to interpret multiple results in order to provide you with the best plan of care or course of treatment. Therefore, we ask that you please give [place] 2 business days to thoroughly review all your results before contacting the office for clarification. Should we see a critical lab result, you will be contacted sooner.   If You Need Anything After Your Visit  If you have any questions or concerns for your doctor, please call our main line at [phone] and press option 4 to reach your doctor's medical assistant. If no one answers, please leave a voicemail as directed and we will return your call as soon as possible. Messages left after 4 pm will be answered the following business day.   You may also send [place] a message via MyChart. We typically respond to MyChart messages within 1-2 business days.  For prescription refills, please ask your pharmacy to contact our office. Our fax number is [phone].  If you have an urgent issue when the clinic is closed that cannot wait until the next business day, you can page your doctor at the number below.    Please note that while we do our best to be available for urgent issues outside of office hours, we are not available 24/7.   If you have an urgent issue and are unable to reach [place], you may choose to seek medical care at  your doctor's office, retail clinic, urgent care center, or emergency room.  If you have a medical emergency, please immediately call 911 or go to the emergency department.  Pager Numbers  - Dr. Gwen Pounds: [phone]  - Dr. Roseanne Reno: [phone]  - Dr. Katrinka Blazing: [phone]   In the event of inclement weather, please call our main line at [phone] for an update on the status of any delays or closures.  Dermatology Medication Tips: Please keep the boxes that topical medications come in in order to help keep track of the instructions  about where and how to use these. Pharmacies typically print the medication instructions only on the boxes and not directly on the medication tubes.   If your medication is too expensive, please contact our office at [phone] option 4 or send [place] a message through MyChart.   We are unable to tell what your co-pay for medications will be in advance as this is different depending on your insurance coverage. However, we may be able to find a substitute medication at lower cost or fill out paperwork to get insurance to cover a needed medication.   If a prior authorization is required to get your medication covered by your insurance company, please allow [place] 1-2 business days to complete this process.  Drug prices often vary depending on where the prescription is filled and some pharmacies may offer cheaper prices.  The website www.goodrx.com contains coupons for medications through different pharmacies. The prices here do not account for what the cost may be with help from insurance (it may be cheaper with your insurance), but the website can give you the price if you did not use any insurance.  - You can print the associated coupon and take it with your prescription to the pharmacy.  - You may also stop by our office during regular business hours and pick up a GoodRx coupon card.  - If you need your prescription sent electronically to a different pharmacy, notify our office through [facility] or by phone at [phone] option 4.     Si Usted Necesita Algo Despus de Su Visita  Tambin puede enviarnos un mensaje a travs de Clinical cytogeneticist. Por lo general respondemos a los mensajes de MyChart en el transcurso de 1 a 2 das hbiles.  Para renovar recetas, por favor pida a su farmacia que se ponga en contacto con nuestra oficina. Annie Sable de fax es [place] [phone].  Si tiene un asunto urgente cuando la clnica est cerrada y que no puede esperar hasta el siguiente da hbil, puede  llamar/localizar a su doctor(a) al nmero que aparece a continuacin.   Por favor, tenga en cuenta que aunque hacemos todo lo posible para estar disponibles para asuntos urgentes fuera del horario de [place], no estamos disponibles las 24 horas del da, los 7 [address]  [address] de la [place].   Si tiene un problema urgente y no puede comunicarse con nosotros, puede optar por buscar atencin mdica  en el consultorio de su doctor(a), en una clnica privada, en un centro de atencin urgente o en una sala de emergencias.  Si tiene Engineer, drilling, por favor llame inmediatamente al 911 o vaya a la sala de emergencias.  Nmeros de bper  - Dr. Gwen Pounds: [phone]  - Dra. Roseanne Reno: [phone]  - Dr. Katrinka Blazing: [phone]   En caso de inclemencias del tiempo, por favor llame a Lacy Duverney principal al [phone] para una actualizacin sobre el [place] de cualquier retraso o cierre.  Consejos para la medicacin  en dermatologa: Por favor, guarde las cajas en las que vienen los medicamentos de uso tpico para ayudarle a seguir las instrucciones sobre dnde y cmo usarlos. Las farmacias generalmente imprimen las instrucciones del medicamento slo en las cajas y no directamente en los tubos del [place].   Si su medicamento es muy caro, por favor, pngase en contacto con Rolm Gala llamando al [phone] y presione la opcin 4 o envenos un mensaje a travs de Clinical cytogeneticist.   No podemos decirle cul ser su copago por los medicamentos por adelantado ya que esto es diferente dependiendo de la cobertura de su seguro. Sin embargo, es posible que podamos encontrar un medicamento sustituto a Audiological scientist un formulario para que el seguro cubra el medicamento que se considera necesario.   Si se requiere una autorizacin previa para que su compaa de seguros [place] su medicamento, por favor permtanos de 1 a 2 das hbiles para completar [address]  Los precios de los medicamentos varan con  frecuencia dependiendo del Environmental consultant de dnde se surte la receta y alguna farmacias pueden ofrecer precios ms baratos.  El sitio web www.goodrx.com tiene cupones para medicamentos de Health and safety inspector. Los precios aqu no tienen en cuenta lo que podra costar con la ayuda del seguro (puede ser ms barato con su seguro), pero el sitio web puede darle el precio si no utiliz Tourist information centre manager.  - Puede imprimir el cupn correspondiente y llevarlo con su receta a la farmacia.  - Tambin puede pasar por nuestra oficina durante el horario de atencin regular y Education officer, museum una tarjeta de cupones de GoodRx.  - Si necesita que su receta se enve electrnicamente a una farmacia diferente, informe a nuestra oficina a travs de MyChart de [place] o por telfono llamando al [phone] y presione la opcin 4.

## 2023-12-07 NOTE — Progress Notes (Signed)
   Follow-Up Visit   Subjective  [name] is a 89 y.o. female who presents for the following: 6 month neoplasm recheck. Right arm, vertex scalp. Has not noticed any changes with lesion on arm. Hits area on scalp when combs hair.   The patient has spots, moles and lesions to be evaluated, some may be new or changing and the patient may have concern these could be cancer.  Daughter is with patient and contributes to history.    The following portions of the chart were reviewed this encounter and updated as appropriate: medications, allergies, medical history  Review of Systems:  No other skin or systemic complaints except as noted in HPI or Assessment and Plan.  Objective  Well appearing patient in no apparent distress; mood and affect are within normal limits.  A focused examination was performed of the following areas: Arms, hands, vertex scalp  Relevant physical exam findings are noted in the Assessment and Plan.  vertex scalp 10 x 11 mm pink scaly plaque   Assessment & Plan   NEOPLASM OF SKIN vertex scalp Skin / nail biopsy Type of biopsy: tangential   Informed consent: discussed and consent obtained   Timeout: patient name, date of birth, surgical site, and procedure verified   Procedure prep:  Patient was prepped and draped in usual sterile fashion Prep type:  Isopropyl alcohol Anesthesia: the lesion was anesthetized in a standard fashion   Anesthetic:  1% lidocaine w/ epinephrine 1-100,000 buffered w/ 8.4% NaHCO3 Instrument used: DermaBlade   Hemostasis achieved with: pressure and aluminum chloride   Outcome: patient tolerated procedure well   Post-procedure details: sterile dressing applied and wound care instructions given   Dressing type: bandage and petrolatum   Specimen 1 - Surgical pathology Differential Diagnosis: R/O SCC  Check Margins: No     Return in about 6 months (around 06/05/2024) for TBSE, HxSCC.  I, Lawson Radar, CMA, am acting as  scribe for Elie Goody, MD.   Documentation: I have reviewed the above documentation for accuracy and completeness, and I agree with the above.  Elie Goody, MD

## 2023-12-08 LAB — SURGICAL PATHOLOGY

## 2023-12-10 ENCOUNTER — Telehealth: Payer: Self-pay

## 2023-12-10 NOTE — Telephone Encounter (Signed)
Patient's daughter called and family has decided to do [facility] to [facility] in situ on scalp. Appt has been scheduled. aw

## 2023-12-15 ENCOUNTER — Other Ambulatory Visit (HOSPITAL_COMMUNITY): Payer: Self-pay

## 2023-12-15 ENCOUNTER — Other Ambulatory Visit: Payer: Self-pay

## 2023-12-17 DIAGNOSIS — H6123 Impacted cerumen, bilateral: Secondary | ICD-10-CM | POA: Diagnosis not present

## 2024-01-12 ENCOUNTER — Ambulatory Visit: Payer: HMO | Admitting: Dermatology

## 2024-01-12 ENCOUNTER — Encounter: Payer: Self-pay | Admitting: Dermatology

## 2024-01-12 DIAGNOSIS — D099 Carcinoma in situ, unspecified: Secondary | ICD-10-CM

## 2024-01-12 DIAGNOSIS — D044 Carcinoma in situ of skin of scalp and neck: Secondary | ICD-10-CM | POA: Diagnosis not present

## 2024-01-12 NOTE — Patient Instructions (Signed)

## 2024-01-12 NOTE — Progress Notes (Signed)
   Follow-Up Visit   Subjective  [name] is a 89 y.o. female who presents for the following: EDC to SCCIS on vertex scalp biopsy proven SQUAMOUS CELL CARCINOMA IN SITU, EXCORIATED   Patient accompanied by daughter who contributes to history.   The patient has spots, moles and lesions to be evaluated, some may be new or changing and the patient may have concern these could be cancer.   The following portions of the chart were reviewed this encounter and updated as appropriate: medications, allergies, medical history  Review of Systems:  No other skin or systemic complaints except as noted in HPI or Assessment and Plan.  Objective  Well appearing patient in no apparent distress; mood and affect are within normal limits.  A focused examination was performed of the following areas: Scalp  Relevant exam findings are noted in the Assessment and Plan.  Vertex scalp    Assessment & Plan    SQUAMOUS CELL CARCINOMA IN SITU (SCCIS) Vertex scalp Destruction of lesion Complexity: simple   Destruction method: electrodesiccation and curettage   Informed consent: discussed and consent obtained   Timeout:  patient name, date of birth, surgical site, and procedure verified Procedure prep:  Patient was prepped and draped in usual sterile fashion Prep type:  Isopropyl alcohol Anesthesia: the lesion was anesthetized in a standard fashion   Anesthetic:  1% lidocaine w/ epinephrine 1-100,000 local infiltration Curettage performed in three different directions: Yes   Electrodesiccation performed over the curetted area: Yes   Curettage cycles:  3 Final wound size (cm):  2.2 Hemostasis achieved with:  aluminum chloride and electrodesiccation Outcome: patient tolerated procedure well with no complications   Post-procedure details: sterile dressing applied and wound care instructions given   Dressing type: bandage and petrolatum   EDC today biopsy proven squamous cell carcinoma in  situ, excoriated  Return in about 2 months (around 03/13/2024) for recheck SCCIS at vertex scalp, w/ Dr. Katrinka Blazing.  Wynonia Lawman, CMA, am acting as scribe for Elie Goody, MD .   Documentation: I have reviewed the above documentation for accuracy and completeness, and I agree with the above.  Elie Goody, MD

## 2024-02-04 ENCOUNTER — Ambulatory Visit: Payer: HMO | Admitting: Dermatology

## 2024-03-02 ENCOUNTER — Ambulatory Visit (INDEPENDENT_AMBULATORY_CARE_PROVIDER_SITE_OTHER): Payer: HMO

## 2024-03-02 VITALS — Ht 63.75 in | Wt 148.0 lb

## 2024-03-02 DIAGNOSIS — Z0000 Encounter for general adult medical examination without abnormal findings: Secondary | ICD-10-CM

## 2024-03-02 NOTE — Progress Notes (Signed)
 Please attest and cosign this visit due to patients primary care provider not being in the office at the time the visit was completed.    Subjective:   [name] is a 89 y.o. who presents for a Medicare Wellness preventive visit.  As a reminder, Annual Wellness Visits don't include a physical exam, and some assessments may be limited, especially if this visit is performed virtually. We may recommend an in-person visit if needed.  Visit Complete: Virtual I connected with  [name] on 03/02/24 by a audio enabled telemedicine application and verified that I am speaking with the correct person using two identifiers.  Patient Location: Home  Provider Location: Office/Clinic  I discussed the limitations of evaluation and management by telemedicine. The patient expressed understanding and agreed to proceed.  Vital Signs: Because this visit was a virtual/telehealth visit, some criteria may be missing or patient reported. Any vitals not documented were not able to be obtained and vitals that have been documented are patient reported.  VideoDeclined- This patient declined Librarian, academic. Therefore the visit was completed with audio only.  Persons Participating in Visit: Patient assisted by Janice/daughter.  AWV Questionnaire: No: Patient Medicare AWV questionnaire was not completed prior to this visit.  Cardiac Risk Factors include: advanced age (>36men, >52 women);hypertension;female gender;dyslipidemia;sedentary lifestyle     Objective:     Today's Vitals   03/02/24 1342  Weight: 148 lb (67.1 kg)  Height: 5' 3.75" (1.619 m)   Body mass index is 25.6 kg/m.     03/02/2024    1:52 PM 11/06/2021    3:29 PM 09/25/2021   10:30 AM 09/21/2020   10:37 AM 09/20/2019   11:31 AM 09/08/2018    2:26 PM 06/23/2017   11:03 AM  Advanced Directives  Does Patient Have a Medical Advance Directive? Yes No Yes Yes Yes Yes Yes  Type of Special educational needs teacher of [place];Living will  Healthcare Power of [place];Living will Healthcare Power of [place];Living will Healthcare Power of [place];Living will Living will Living will  Does patient want to make changes to medical advance directive?   Yes (MAU/Ambulatory/Procedural Areas - Information given)      Copy of Healthcare Power of Attorney in Chart? Yes - validated most recent copy scanned in chart (See row information)   No - copy requested No - copy requested    Would patient like information on creating a medical advance directive?  No - Patient declined         Current Medications (verified) Outpatient Encounter Medications as of 03/02/2024  Medication Sig   albuterol  (VENTOLIN  HFA) 108 (90 Base) MCG/ACT inhaler Inhale 2 puffs into the lungs every 6 (six) hours as needed for wheezing or shortness of breath.   aspirin  81 MG tablet Take 81 mg by mouth daily.   colchicine  0.6 MG tablet 1 tab po x 1 then repeat in 2 hours, continue daily until flare resolved.   hydroxypropyl methylcellulose (ISOPTO TEARS) 2.5 % ophthalmic solution Place 1 drop into both eyes 3 (three) times daily as needed (dry eyes).   lisinopril -hydrochlorothiazide  (ZESTORETIC ) 20-12.5 MG tablet Take 2 tablets by mouth daily.   metoprolol  tartrate (LOPRESSOR ) 25 MG tablet Take 1 tablet (25 mg total) by mouth 2 (two) times daily. Take with 50 mg tablet for total of 75 mg.   metoprolol  tartrate (LOPRESSOR ) 50 MG tablet Take 1 tablet (50 mg total) by mouth 2 (two) times daily. Take with 25 mg tablet  for total of 75 mg   Multiple Vitamin (MULTIVITAMIN) tablet Take 1 tablet by mouth daily.   mupirocin  ointment (BACTROBAN ) 2 % Apply 1 application topically 3 (three) times daily.   neomycin-polymyxin b-dexamethasone  (MAXITROL) 3.5-10000-0.1 OINT    omeprazole  (PRILOSEC) 40 MG capsule Take 1 capsule (40 mg total) by mouth daily.   OVER THE COUNTER MEDICATION I caps 1 tab bid   polyethylene glycol (MIRALAX  / GLYCOLAX ) 17 g  packet Take 17 g by mouth daily.   sertraline  (ZOLOFT ) 50 MG tablet Take 1 tablet (50 mg total) by mouth at bedtime.   No facility-administered encounter medications on file as of 03/02/2024.    Allergies (verified) Patient has no known allergies.   History: Past Medical History:  Diagnosis Date   Actinic keratosis    Erosive esophagitis    Gastric ulcer    Gastritis    GERD (gastroesophageal reflux disease)    HLD (hyperlipidemia)    HTN (hypertension)    Osteoarthritis    Osteopenia    Prediabetes    pt does not check cbg at home   Squamous cell carcinoma in situ (SCCIS) 12/07/2023   vertex scalp, EDC 01/12/24   Squamous cell carcinoma of skin 09/21/2019   Mid vertex scalp  Squamous Cell Carcinoma in Situ   Squamous cell carcinoma of skin 08/30/2020   mid vertex scalp  SCCis , Mohs 09/28/2020   Past Surgical History:  Procedure Laterality Date   APPENDECTOMY  yrs ago   arthroscopic knee surgery Left yrs ago   benign nodule removed from neck  05-21-2013   EYE SURGERY Bilateral 9- 9-19-2011and 07-2010   cataract lens replacement   KNEE SURGERY Left 05/2009   to cut nerves   left knee replacement  09-30-2005   nuclear cardiolyte  10/08   right knee replacement Right 08-10-2013   right shoulder surgery  2005   TOTAL KNEE REVISION Left 05/30/2013   Procedure: LEFT TOTAL KNEE REVISION;  Surgeon: Bevin Bucks, MD;  Location: [facility];  Service: Orthopedics;  Laterality: Left;   Family History  Problem Relation Age of Onset   Coronary artery disease Father 55   Hypertension Sister        x 4   Breast cancer Sister 69   Breast cancer Daughter 67   Social History   Socioeconomic History   Marital status: Widowed    Spouse name: Not on file   Number of children: Not on file   Years of education: Not on file   Highest education level: Not on file  Occupational History   Not on file  Tobacco Use   Smoking status: Never   Smokeless tobacco: Never  Vaping Use    Vaping status: Never Used  Substance and Sexual Activity   Alcohol  use: No    Alcohol /week: 0.0 standard drinks of alcohol    Drug use: No   Sexual activity: Not Currently  Other Topics Concern   Not on file  Social History Narrative   Full code, has living will, HCPOA children (reviewed 05/2014)   Social Drivers of Health   Financial Resource Strain: Low Risk  (03/02/2024)   Overall Financial Resource Strain (CARDIA)    Difficulty of Paying Living Expenses: Not hard at all  Food Insecurity: No Food Insecurity (03/02/2024)   Hunger Vital Sign    Worried About Running Out of Food in the Last Year: Never true    Ran Out of Food in the Last Year: Never true  Transportation Needs: No Transportation Needs (03/02/2024)   PRAPARE - Administrator, Civil Service (Medical): No    Lack of Transportation (Non-Medical): No  Physical Activity: Inactive (03/02/2024)   Exercise Vital Sign    Days of Exercise per Week: 0 days    Minutes of Exercise per Session: 0 min  Stress: No Stress Concern Present (09/25/2021)   [employer] of Occupational Health - Occupational Stress Questionnaire    Feeling of Stress : Not at all  Social Connections: Moderately Isolated (03/02/2024)   Social Connection and Isolation Panel [NHANES]    Frequency of Communication with Friends and Family: More than three times a week    Frequency of Social Gatherings with Friends and Family: More than three times a week    Attends Religious Services: More than 4 times per year    Active Member of [employer] or Organizations: No    Attends Banker Meetings: Never    Marital Status: Widowed    Tobacco Counseling Counseling given: Not Answered    Clinical Intake:  Pre-visit preparation completed: Yes  Pain : No/denies pain     BMI - recorded: 25.6 Nutritional Status: BMI 25 -29 Overweight Nutritional Risks: None Diabetes: No  Lab Results  Component Value Date   HGBA1C 6.3 03/18/2023    HGBA1C 6.3 01/02/2022   HGBA1C 6.0 09/21/2020     How often do you need to have someone help you when you read instructions, pamphlets, or other written materials from your doctor or pharmacy?: 5 - Always  Interpreter Needed?: No  Comments: lives alone Information entered by :: B.Cadyn Fann,LPN   Activities of Daily Living     03/02/2024    1:52 PM  In your present state of health, do you have any difficulty performing the following activities:  Hearing? 0  Vision? 1  Difficulty concentrating or making decisions? 0  Walking or climbing stairs? 0  Dressing or bathing? 0  Doing errands, shopping? 0  Preparing Food and eating ? N  Using the Toilet? N  In the past six months, have you accidently leaked urine? N  Do you have problems with loss of bowel control? N  Managing your Medications? N  Managing your Finances? N  Housekeeping or managing your Housekeeping? N    Patient Care Team: Judithann Novas, MD as PCP - General  Indicate any recent Medical Services you may have received from other than [facility] providers in the past year (date may be approximate).     Assessment:    This is a routine wellness examination for [name].  Hearing/Vision screen Hearing Screening - Comments:: Pt says    Goals Addressed             This Visit's Progress    Patient Stated   On track    03/02/24-I will maintain and continue medications as prescribed.      Patient Stated   Not on track    03/02/24-Would like to drink more water       Depression Screen     03/02/2024    1:48 PM 03/27/2023   10:29 AM 01/30/2023    3:01 PM 01/02/2022   11:52 AM 09/25/2021   10:34 AM 09/21/2020   10:38 AM 09/20/2019   11:34 AM  PHQ 2/9 Scores  PHQ - 2 Score 0 3 3 0 0 0 2  PHQ- 9 Score  8 14   0 2    Fall Risk  03/02/2024    1:46 PM 01/30/2023    3:01 PM 01/02/2022   11:52 AM 09/25/2021   10:32 AM 09/21/2020   10:38 AM  Fall Risk   Falls in the past year? 0 0 0 0 0  Number falls in past yr: 0 0  0 0 0  Injury with Fall? 0 0 0 0 0  Risk for fall due to : No Fall Risks No Fall Risks   Impaired vision;Medication side effect  Follow up Education provided;Falls prevention discussed Falls evaluation completed Falls evaluation completed Falls prevention discussed Falls prevention discussed;Falls evaluation completed    MEDICARE RISK AT HOME:  Medicare Risk at Home Any stairs in or around the home?: Yes If so, are there any without handrails?: Yes Home free of loose throw rugs in walkways, pet beds, electrical cords, etc?: Yes Adequate lighting in your home to reduce risk of falls?: Yes Life alert?: No Use of a cane, walker or w/c?: No Grab bars in the bathroom?: No Shower chair or bench in shower?: Yes Elevated toilet seat or a handicapped toilet?: Yes  TIMED UP AND GO:  Was the test performed?  No  Cognitive Function: 6CIT completed    09/21/2020   10:42 AM 09/20/2019   11:35 AM 09/08/2018    2:29 PM 06/23/2017   11:03 AM  MMSE - Mini Mental State Exam  Orientation to time 5 5 5 5   Orientation to Place 5 5 5 5   Registration 3 3 3 3   Attention/ Calculation 5 5 0 0  Recall 3 3 3 3   Language- name 2 objects   0 0  Language- repeat 1 1 1 1   Language- follow 3 step command   3 3  Language- read & follow direction   0 0  Write a sentence   0 0  Copy design   0 0  Total score   20 20        03/02/2024    1:53 PM  6CIT Screen  What Year? 0 points  What month? 0 points  What time? 0 points  Count back from 20 0 points  Months in reverse 0 points  Repeat phrase 2 points  Total Score 2 points    Immunizations Immunization History  Administered Date(s) Administered   Fluad Quad(high Dose 65+) 07/21/2019, 08/23/2020   Fluad Trivalent(High Dose 65+) 09/08/2023   H1N1 11/06/2008   Influenza Split 07/22/2012   Influenza Whole 07/21/2007, 07/20/2008, 08/20/2010   Influenza, High Dose Seasonal PF 08/29/2021   Influenza,inj,Quad PF,6+ Mos 06/26/2017, 09/08/2018    Influenza-Unspecified 07/20/2016   PFIZER Comirnaty(Gray Top)Covid-19 Tri-Sucrose Vaccine 03/19/2021   PFIZER(Purple Top)SARS-COV-2 Vaccination 11/09/2019, 11/30/2019, 08/05/2020   Pfizer Covid-19 Vaccine Bivalent Booster 106yrs & up 01/20/2022   Pneumococcal Conjugate-13 06/13/2014   Pneumococcal Polysaccharide-23 10/20/1998   Td 10/20/1998, 11/06/2008   Zoster, Live 10/20/2008    Screening Tests Health Maintenance  Topic Date Due   Zoster Vaccines- Shingrix (1 of 2) 09/29/1944   DTaP/Tdap/Td (3 - Tdap) 11/06/2018   COVID-19 Vaccine (6 - 2024-25 season) 06/21/2023   INFLUENZA VACCINE  05/20/2024   Medicare Annual Wellness (AWV)  03/02/2025   MAMMOGRAM  05/04/2025   DEXA SCAN  06/17/2028   Pneumonia Vaccine 31+ Years old  Completed   HPV VACCINES  Aged Out   Meningococcal B Vaccine  Aged Out    Health Maintenance  Health Maintenance Due  Topic Date Due   Zoster Vaccines- Shingrix (1 of 2) 09/29/1944  DTaP/Tdap/Td (3 - Tdap) 11/06/2018   COVID-19 Vaccine (6 - 2024-25 season) 06/21/2023   Health Maintenance Items Addressed: None needed at this time  Additional Screening:  Vision Screening: Recommended annual ophthalmology exams for early detection of glaucoma and other disorders of the eye.  Dental Screening: Recommended annual dental exams for proper oral hygiene  Community Resource Referral / Chronic Care Management: CRR required this visit?  No   CCM required this visit?  Appt scheduled with PCP   Plan:    I have personally reviewed and noted the following in the patient's chart:   Medical and social history Use of alcohol , tobacco or illicit drugs  Current medications and supplements including opioid prescriptions. Patient is not currently taking opioid prescriptions. Functional ability and status Nutritional status Physical activity Advanced directives List of other physicians Hospitalizations, surgeries, and ER visits in previous 12  months Vitals Screenings to include cognitive, depression, and falls Referrals and appointments  In addition, I have reviewed and discussed with patient certain preventive protocols, quality metrics, and best practice recommendations. A written personalized care plan for preventive services as well as general preventive health recommendations were provided to patient.   Nerissa Bannister, LPN   03/26/3015   After Visit Summary: (Declined) Due to this being a telephonic visit, with patients personalized plan was offered to patient but patient Declined AVS at this time   Notes: Nothing significant to report at this time.

## 2024-03-02 NOTE — Patient Instructions (Signed)
 [name] , Thank you for taking time out of your busy schedule to complete your Annual Wellness Visit with me. I enjoyed our conversation and look forward to speaking with you again next year. I, as well as your care team,  appreciate your ongoing commitment to your health goals. Please review the following plan we discussed and let me know if I can assist you in the future. Your Game plan/ To Do List    Follow up Visits: Next Medicare AWV with our clinical staff: 03/03/25 @ 2:20pm televisit   Have you seen your provider in the last 6 months (3 months if uncontrolled diabetes)? No Next Office Visit with your provider: 04/01/24  Clinician Recommendations:  Pt says she doe snot walk only in the yard. Will increase water to  2-4 glasses of water, and try to eat 5 servings of fruits and vegetables each day.       This is a list of the screening recommended for you and due dates:  Health Maintenance  Topic Date Due   Zoster (Shingles) Vaccine (1 of 2) 09/29/1944   DTaP/Tdap/Td vaccine (3 - Tdap) 11/06/2018   COVID-19 Vaccine (6 - 2024-25 season) 06/21/2023   Flu Shot  05/20/2024   Medicare Annual Wellness Visit  03/02/2025   Mammogram  05/04/2025   DEXA scan (bone density measurement)  06/17/2028   Pneumonia Vaccine  Completed   HPV Vaccine  Aged Out   Meningitis B Vaccine  Aged Out    Advanced directives: (Copy Requested) Please bring a copy of your health care power of attorney and living will to the office to be added to your chart at your convenience. You can mail to [facility] [address] [place], [place] [address] or email to [email or web address] Advance Care Planning is important because it:  [x]  Makes sure you receive the medical care that is consistent with your values, goals, and preferences  [x]  It provides guidance to your family and loved ones and reduces their decisional burden about whether or not they are making the right decisions based on your  wishes.  Follow the link provided in your after visit summary or read over the paperwork we have mailed to you to help you started getting your Advance Directives in place. If you need assistance in completing these, please reach out to us  so that we can help you!

## 2024-03-15 ENCOUNTER — Encounter: Payer: Self-pay | Admitting: Dermatology

## 2024-03-15 ENCOUNTER — Ambulatory Visit: Admitting: Dermatology

## 2024-03-15 DIAGNOSIS — L570 Actinic keratosis: Secondary | ICD-10-CM

## 2024-03-15 DIAGNOSIS — W908XXA Exposure to other nonionizing radiation, initial encounter: Secondary | ICD-10-CM

## 2024-03-15 DIAGNOSIS — S0100XA Unspecified open wound of scalp, initial encounter: Secondary | ICD-10-CM | POA: Diagnosis not present

## 2024-03-15 DIAGNOSIS — Z86007 Personal history of in-situ neoplasm of skin: Secondary | ICD-10-CM | POA: Diagnosis not present

## 2024-03-15 NOTE — Progress Notes (Signed)
   Follow-Up Visit   Subjective  [name] is a 89 y.o. female who presents for the following: 2 month follow up. SCCis at vertex scalp. Tx with [facility] 01/12/2024.  Patient reports area is a little tender and has a small scab but is improving.   The patient has spots, moles and lesions to be evaluated, some may be new or changing and the patient may have concern these could be cancer.  This patient is accompanied in the office by her daughter, [name].   The following portions of the chart were reviewed this encounter and updated as appropriate: medications, allergies, medical history  Review of Systems:  No other skin or systemic complaints except as noted in HPI or Assessment and Plan.  Objective  Well appearing patient in no apparent distress; mood and affect are within normal limits.  A focused examination was performed of the following areas: Scalp  Relevant physical exam findings are noted in the Assessment and Plan.      Assessment & Plan   HISTORY OF SQUAMOUS CELL CARCINOMA IN SITU OF THE SKIN. Bx: 12/07/2023. EDC 01/12/2024 - Healing EDC site, mostly healed, central area is still open - No evidence of recurrence today - Recommend regular full body skin exams - Recommend daily broad spectrum sunscreen SPF 30+ to sun-exposed areas, reapply every 2 hours as needed.  - Call if any new or changing lesions are noted between office visits  - cover open area with Vaseline or Aquaphor daily  ACTINIC KERATOSIS Exam: Erythematous thin papules/macules with gritty scale at the left and right vertex scalp  Actinic keratoses are precancerous spots that appear secondary to cumulative UV radiation exposure/sun exposure over time. They are chronic with expected duration over 1 year. A portion of actinic keratoses will progress to squamous cell carcinoma of the skin. It is not possible to reliably predict which spots will progress to skin cancer and so treatment is recommended to  prevent development of skin cancer.  Recommend daily broad spectrum sunscreen SPF 30+ to sun-exposed areas, reapply every 2 hours as needed.  Recommend staying in the shade or wearing long sleeves, sun glasses (UVA+UVB protection) and wide brim hats (4-inch brim around the entire circumference of the hat). Call for new or changing lesions.  Treatment Plan: Patient prefers to watch today.   HISTORY OF SQUAMOUS CELL CARCINOMA IN SITU (SCCIS)   ACTINIC KERATOSES     Return for TBSE As Scheduled, With Dr. Felipe Horton.  I, Jill Parcell, CMA, am acting as scribe for Harris Liming, MD.   Documentation: I have reviewed the above documentation for accuracy and completeness, and I agree with the above.  Harris Liming, MD

## 2024-03-15 NOTE — Patient Instructions (Signed)

## 2024-03-21 ENCOUNTER — Other Ambulatory Visit: Payer: Self-pay

## 2024-03-21 ENCOUNTER — Other Ambulatory Visit (HOSPITAL_COMMUNITY): Payer: Self-pay

## 2024-03-22 ENCOUNTER — Other Ambulatory Visit: Payer: Self-pay

## 2024-04-01 ENCOUNTER — Ambulatory Visit (INDEPENDENT_AMBULATORY_CARE_PROVIDER_SITE_OTHER): Admitting: Family Medicine

## 2024-04-01 ENCOUNTER — Encounter: Payer: Self-pay | Admitting: Family Medicine

## 2024-04-01 VITALS — BP 140/70 | HR 63 | Temp 97.9°F | Ht 63.0 in | Wt 141.5 lb

## 2024-04-01 DIAGNOSIS — R5383 Other fatigue: Secondary | ICD-10-CM | POA: Diagnosis not present

## 2024-04-01 DIAGNOSIS — E782 Mixed hyperlipidemia: Secondary | ICD-10-CM | POA: Diagnosis not present

## 2024-04-01 DIAGNOSIS — Z0000 Encounter for general adult medical examination without abnormal findings: Secondary | ICD-10-CM | POA: Diagnosis not present

## 2024-04-01 DIAGNOSIS — R7303 Prediabetes: Secondary | ICD-10-CM | POA: Diagnosis not present

## 2024-04-01 DIAGNOSIS — I10 Essential (primary) hypertension: Secondary | ICD-10-CM

## 2024-04-01 DIAGNOSIS — M1A0710 Idiopathic chronic gout, right ankle and foot, without tophus (tophi): Secondary | ICD-10-CM

## 2024-04-01 DIAGNOSIS — E559 Vitamin D deficiency, unspecified: Secondary | ICD-10-CM | POA: Diagnosis not present

## 2024-04-01 NOTE — Patient Instructions (Signed)
Please stop at the lab to have labs drawn.  

## 2024-04-01 NOTE — Assessment & Plan Note (Addendum)
 Chronic, slightly above goal today in office.. pt will follow at home and let me know if BP consistently elevated.  Well controlled on lisinopril /hydrochlorothiazide  20/12.5 mg daily, metoprolol  25 mg 1 tablet twice daily along with an additional 50 mg 1 tablet twice daily for total of 75 mg twice daily.

## 2024-04-01 NOTE — Assessment & Plan Note (Addendum)
 Chronic,  due for re-eval.  Hx of CKD, on HCTZ  No recent flares, 2-3 in last year.

## 2024-04-01 NOTE — Assessment & Plan Note (Addendum)
 Chronic due for re-eval diet control.

## 2024-04-01 NOTE — Progress Notes (Signed)
 Patient ID: [name], female    DOB: 10-15-1925, 89 y.o.   MRN: [id number]  This visit was conducted in person.  BP (!) 140/70   Pulse 63   Temp 97.9 F (36.6 C) (Oral)   Ht 5' 3 (1.6 m)   Wt 141 lb 8 oz (64.2 kg)   SpO2 96%   BMI 25.07 kg/m    CC:  Chief Complaint  Patient presents with   Annual Exam    MWV 03/02/24    Subjective:   HPI: [name] is a 89 y.o. female presenting on 04/01/2024 for Annual Exam (MWV 03/02/24)  The patient presents for complete physical and review of chronic health problems. He/She also has the following acute concerns today:  The patient saw a LPN or RN for medicare wellness visit. 03/02/2024  Prevention and wellness was reviewed in detail. Note reviewed and important notes copied below.   Advance directives and end of life planning reviewed in detail with patient and documented in EMR. Patient given handout on advance care directives if needed. HCPOA and living will updated if needed.  No falls in last 12 months.   Flowsheet Row Office Visit from 04/01/2024 in [facility]  PHQ-2 Total Score 2   Hypertension: At goal on Lisinopril /hydrochlorothiazide  20/12.5 mg daily, metoprolol  25 mg 1 tablet twice daily along with an additional 50 mg 1 tablet twice daily for total of 75 mg twice daily.  BP Readings from Last 3 Encounters:  04/01/24 (!) 140/70  05/27/23 130/64  03/27/23 130/64  Using medication without problems or lightheadedness:  none Chest pain with exertion: none Edema: yes, compression hose prn Short of breath:  back at baseline, [place] Average home BPs:  125/50-159/60 yesterday at home Other issues:  Prediabetes   Lab Results  Component Value Date   HGBA1C 6.3 03/18/2023     Elevated Cholesterol: Lab Results  Component Value Date   CHOL 188 03/18/2023   HDL 33.20 (L) 03/18/2023   LDLCALC 146 (H) 06/23/2017   LDLDIRECT 116.0 03/18/2023   TRIG 218.0 (H) 03/18/2023    CHOLHDL 6 03/18/2023  Using medications without problems: Muscle aches:  Diet compliance: moderate Exercise:limited Other complaints:   MDD..  Stable control on sertraline  50 mg p.o. daily    GOUT:  due for re-eval.  She is on  lisinopril  HCTZ and has CKD.     Relevant past medical, surgical, family and social history reviewed and updated as indicated. Interim medical history since our last visit reviewed. Allergies and medications reviewed and updated. Outpatient Medications Prior to Visit  Medication Sig Dispense Refill   albuterol  (VENTOLIN  HFA) 108 (90 Base) MCG/ACT inhaler Inhale 2 puffs into the lungs every 6 (six) hours as needed for wheezing or shortness of breath. 6.7 g 2   aspirin  81 MG tablet Take 81 mg by mouth daily.     colchicine  0.6 MG tablet 1 tab po x 1 then repeat in 2 hours, continue daily until flare resolved. 30 tablet 1   lisinopril -hydrochlorothiazide  (ZESTORETIC ) 20-12.5 MG tablet Take 2 tablets by mouth daily. 180 tablet 1   metoprolol  tartrate (LOPRESSOR ) 25 MG tablet Take 1 tablet (25 mg total) by mouth 2 (two) times daily. Take with 50 mg tablet for total of 75 mg. 180 tablet 1   metoprolol  tartrate (LOPRESSOR ) 50 MG tablet Take 1 tablet (50 mg total) by mouth 2 (two) times daily. Take with 25 mg tablet for total  of 75 mg 180 tablet 1   Multiple Vitamin (MULTIVITAMIN) tablet Take 1 tablet by mouth daily.     mupirocin  ointment (BACTROBAN ) 2 % Apply 1 application topically 3 (three) times daily. 22 g 0   neomycin-polymyxin b-dexamethasone  (MAXITROL) 3.5-10000-0.1 OINT      omeprazole  (PRILOSEC) 40 MG capsule Take 1 capsule (40 mg total) by mouth daily. 90 capsule 1   OVER THE COUNTER MEDICATION I caps 1 tab bid     polyethylene glycol (MIRALAX  / GLYCOLAX ) 17 g packet Take 17 g by mouth daily. 5 each 0   sertraline  (ZOLOFT ) 50 MG tablet Take 1 tablet (50 mg total) by mouth at bedtime. 90 tablet 1   hydroxypropyl methylcellulose (ISOPTO TEARS) 2.5 %  ophthalmic solution Place 1 drop into both eyes 3 (three) times daily as needed (dry eyes).     No facility-administered medications prior to visit.     Per HPI unless specifically indicated in ROS section below Review of Systems  Constitutional:  Negative for fatigue and fever.  HENT:  Negative for congestion.   Eyes:  Negative for pain.  Respiratory:  Negative for cough and shortness of breath.   Cardiovascular:  Negative for chest pain, palpitations and leg swelling.  Gastrointestinal:  Negative for abdominal pain.  Genitourinary:  Negative for dysuria and vaginal bleeding.  Musculoskeletal:  Negative for back pain.  Neurological:  Negative for syncope, light-headedness and headaches.  Psychiatric/Behavioral:  Negative for dysphoric mood.    Objective:  BP (!) 140/70   Pulse 63   Temp 97.9 F (36.6 C) (Oral)   Ht 5' 3 (1.6 m)   Wt 141 lb 8 oz (64.2 kg)   SpO2 96%   BMI 25.07 kg/m   Wt Readings from Last 3 Encounters:  04/01/24 141 lb 8 oz (64.2 kg)  03/02/24 148 lb (67.1 kg)  03/27/23 148 lb (67.1 kg)      Physical Exam Constitutional:      General: She is not in acute distress.    Appearance: Normal appearance. She is well-developed. She is not ill-appearing or toxic-appearing.  HENT:     Head: Normocephalic.     Right Ear: Hearing, tympanic membrane, ear canal and external ear normal. Tympanic membrane is not erythematous, retracted or bulging.     Left Ear: Hearing, tympanic membrane, ear canal and external ear normal. Tympanic membrane is not erythematous, retracted or bulging.     Nose: No mucosal edema or rhinorrhea.     Right Sinus: No maxillary sinus tenderness or frontal sinus tenderness.     Left Sinus: No maxillary sinus tenderness or frontal sinus tenderness.     Mouth/Throat:     Pharynx: Uvula midline.   Eyes:     General: Lids are normal. Lids are everted, no foreign bodies appreciated.     Conjunctiva/sclera: Conjunctivae normal.     Pupils:  Pupils are equal, round, and reactive to light.   Neck:     Thyroid : No thyroid  mass or thyromegaly.     Vascular: No carotid bruit.     Trachea: Trachea normal.   Cardiovascular:     Rate and Rhythm: Normal rate and regular rhythm.     Pulses: Normal pulses.     Heart sounds: Normal heart sounds, S1 normal and S2 normal. No murmur heard.    No friction rub. No gallop.  Pulmonary:     Effort: Pulmonary effort is normal. No tachypnea or respiratory distress.     Breath sounds:  Normal breath sounds. No decreased breath sounds, wheezing, rhonchi or rales.  Abdominal:     General: Bowel sounds are normal.     Palpations: Abdomen is soft.     Tenderness: There is no abdominal tenderness.   Musculoskeletal:     Cervical back: Normal range of motion and neck supple.   Skin:    General: Skin is warm and dry.     Findings: No rash.   Neurological:     Mental Status: She is alert.   Psychiatric:        Mood and Affect: Mood is not anxious or depressed.        Speech: Speech normal.        Behavior: Behavior normal. Behavior is cooperative.        Thought Content: Thought content normal.        Judgment: Judgment normal.       Results for orders placed or performed in visit on 12/07/23  Surgical pathology   Collection Time: 12/07/23 12:00 AM  Result Value Ref Range   SURGICAL PATHOLOGY      SURGICAL PATHOLOGY [facility] [address] [place], [place] [address] Telephone [phone] or [phone] Fax [phone]  REPORT OF DERMATOPATHOLOGY   Accession #: [id number] Patient Name: [name], [name] Visit # : [id number]  MRN: [id number] Cytotechnologist: Munoz-Bishop Md, Lovett Ruck, Dermatopathologist, Electronic Signature DOB/Age [birth year] (Age: 66) Gender: F Collected Date: 12/07/2023 Received Date: 12/07/2023  FINAL DIAGNOSIS       1. Skin, vertex scalp :       SQUAMOUS CELL CARCINOMA IN SITU, EXCORIATED       DATE SIGNED  OUT: 12/08/2023 ELECTRONIC SIGNATURE : Munoz-Bishop Md, Melissa, Dermatopathologist, Electronic Signature  MICROSCOPIC DESCRIPTION 1. The epidermis exhibits full thickness keratinocytic atypia. This is a squamous cell carcinoma in situ.  There is focal epidermal necrosis consistent with excoriation.  CASE COMMENTS STAINS USED IN DIAGNOSIS: H&E    CLINICAL HISTORY  SPECIMEN(S) OBTAI NED 1. Skin, Vertex Scalp  SPECIMEN COMMENTS: 1. 10 x 11 mm pink scaly plaque SPECIMEN CLINICAL INFORMATION: 1. Neoplasm of skin, R/O SCC    Gross Description 1. Formalin fixed specimen received:  13 X 11 X 1 MM, TOTO (4 P) (1 B) ( alb )        Report signed out from the following location(s) [place] [facility] [address], [place] [address] CLIA #: [id number]  [facility] [address] [place], [place] [address] CLIA #: [id number]     This visit occurred during the SARS-CoV-2 public health emergency.  Safety protocols were in place, including screening questions prior to the visit, additional usage of staff PPE, and extensive cleaning of exam room while observing appropriate contact time as indicated for disinfecting solutions.   COVID 19 screen:  No recent travel or known exposure to COVID19 The patient denies respiratory symptoms of COVID 19 at this time. The importance of social distancing was discussed today.   Assessment and Plan The patient's preventative maintenance and recommended screening tests for an annual wellness exam were reviewed in full today. Brought up to date unless services declined.  Counselled on the importance of diet, exercise, and its role in overall health and mortality. The patient's FH and SH was reviewed, including their home life, tobacco status, and drug and alcohol  status.   Vaccines uptodate except consider shingrix, COVID and  Tdap  DEXA 05/2023 osteopenia -2.1  Mammogram; wishes  to continue, last  04/2023 Colon: not indicated  Problem List Items Addressed This Visit     Gout, chronic   Chronic,  due for re-eval.  Hx of CKD, on HCTZ  No recent flares, 2-3 in last year.      Relevant Orders   Uric Acid   Hyperlipidemia (Chronic)   Due for re-eval but will not treat aggressively with statin given age.      Relevant Orders   Comprehensive metabolic panel with GFR   Lipid panel   HYPERTENSION, BENIGN (Chronic)   Chronic, slightly above goal today in office.. pt will follow at home and let me know if BP consistently elevated.  Well controlled on lisinopril /hydrochlorothiazide  20/12.5 mg daily, metoprolol  25 mg 1 tablet twice daily along with an additional 50 mg 1 tablet twice daily for total of 75 mg twice daily.      Prediabetes (Chronic)     Chronic due for re-eval diet control.       Relevant Orders   Hemoglobin A1c   Other Visit Diagnoses       Routine general medical examination at a health care facility    -  Primary     Other fatigue       Relevant Orders   CBC with Differential/Platelet   TSH   Vitamin B12   VITAMIN D  25 Hydroxy (Vit-D Deficiency, Fractures)       Orders Placed This Encounter  Procedures   CBC with Differential/Platelet   Comprehensive metabolic panel with GFR   Hemoglobin A1c   Lipid panel   TSH   Uric Acid   Vitamin B12   VITAMIN D  25 Hydroxy (Vit-D Deficiency, Fractures)    Herby Lolling, MD

## 2024-04-01 NOTE — Assessment & Plan Note (Addendum)
 Due for re-eval but will not treat aggressively with statin given age.

## 2024-04-02 LAB — CBC WITH DIFFERENTIAL/PLATELET
Absolute Lymphocytes: 2243 {cells}/uL (ref 850–3900)
Absolute Monocytes: 735 {cells}/uL (ref 200–950)
Basophils Absolute: 30 {cells}/uL (ref 0–200)
Basophils Relative: 0.4 %
Eosinophils Absolute: 218 {cells}/uL (ref 15–500)
Eosinophils Relative: 2.9 %
HCT: 37.3 % (ref 35.0–45.0)
Hemoglobin: 12.2 g/dL (ref 11.7–15.5)
MCH: 30.5 pg (ref 27.0–33.0)
MCHC: 32.7 g/dL (ref 32.0–36.0)
MCV: 93.3 fL (ref 80.0–100.0)
MPV: 11 fL (ref 7.5–12.5)
Monocytes Relative: 9.8 %
Neutro Abs: 4275 {cells}/uL (ref 1500–7800)
Neutrophils Relative %: 57 %
Platelets: 230 10*3/uL (ref 140–400)
RBC: 4 10*6/uL (ref 3.80–5.10)
RDW: 13.1 % (ref 11.0–15.0)
Total Lymphocyte: 29.9 %
WBC: 7.5 10*3/uL (ref 3.8–10.8)

## 2024-04-02 LAB — COMPREHENSIVE METABOLIC PANEL WITH GFR
AG Ratio: 1.4 (calc) (ref 1.0–2.5)
ALT: 12 U/L (ref 6–29)
AST: 13 U/L (ref 10–35)
Albumin: 3.8 g/dL (ref 3.6–5.1)
Alkaline phosphatase (APISO): 66 U/L (ref 37–153)
BUN/Creatinine Ratio: 26 (calc) — ABNORMAL HIGH (ref 6–22)
BUN: 31 mg/dL — ABNORMAL HIGH (ref 7–25)
CO2: 26 mmol/L (ref 20–32)
Calcium: 9.7 mg/dL (ref 8.6–10.4)
Chloride: 104 mmol/L (ref 98–110)
Creat: 1.19 mg/dL — ABNORMAL HIGH (ref 0.60–0.95)
Globulin: 2.8 g/dL (ref 1.9–3.7)
Glucose, Bld: 102 mg/dL — ABNORMAL HIGH (ref 65–99)
Potassium: 4.7 mmol/L (ref 3.5–5.3)
Sodium: 139 mmol/L (ref 135–146)
Total Bilirubin: 0.4 mg/dL (ref 0.2–1.2)
Total Protein: 6.6 g/dL (ref 6.1–8.1)
eGFR: 42 mL/min/{1.73_m2} — ABNORMAL LOW (ref >=60)

## 2024-04-02 LAB — LIPID PANEL
Cholesterol: 194 mg/dL (ref <200)
HDL: 40 mg/dL — ABNORMAL LOW (ref >=50)
LDL Cholesterol (Calc): 129 mg/dL — ABNORMAL HIGH
Non-HDL Cholesterol (Calc): 154 mg/dL — ABNORMAL HIGH (ref <130)
Total CHOL/HDL Ratio: 4.9 (calc) (ref <5.0)
Triglycerides: 136 mg/dL (ref <150)

## 2024-04-02 LAB — VITAMIN B12: Vitamin B-12: 321 pg/mL (ref 200–1100)

## 2024-04-02 LAB — HEMOGLOBIN A1C
Hgb A1c MFr Bld: 6.3 % — ABNORMAL HIGH (ref <5.7)
Mean Plasma Glucose: 134 mg/dL
eAG (mmol/L): 7.4 mmol/L

## 2024-04-02 LAB — VITAMIN D 25 HYDROXY (VIT D DEFICIENCY, FRACTURES): Vit D, 25-Hydroxy: 54 ng/mL (ref 30–100)

## 2024-04-02 LAB — URIC ACID: Uric Acid, Serum: 7.7 mg/dL — ABNORMAL HIGH (ref 2.5–7.0)

## 2024-04-02 LAB — TSH: TSH: 2.62 m[IU]/L (ref 0.40–4.50)

## 2024-04-05 DIAGNOSIS — H6123 Impacted cerumen, bilateral: Secondary | ICD-10-CM | POA: Diagnosis not present

## 2024-04-07 ENCOUNTER — Ambulatory Visit: Payer: Self-pay | Admitting: Family Medicine

## 2024-04-19 ENCOUNTER — Other Ambulatory Visit: Payer: Self-pay | Admitting: Family Medicine

## 2024-04-19 DIAGNOSIS — Z1231 Encounter for screening mammogram for malignant neoplasm of breast: Secondary | ICD-10-CM

## 2024-05-12 ENCOUNTER — Encounter (HOSPITAL_COMMUNITY): Payer: Self-pay | Admitting: Emergency Medicine

## 2024-05-12 ENCOUNTER — Other Ambulatory Visit: Payer: Self-pay

## 2024-05-12 ENCOUNTER — Emergency Department (HOSPITAL_COMMUNITY)
Admission: EM | Admit: 2024-05-12 | Discharge: 2024-05-12 | Disposition: A | Discharge status: Home / Self Care | Attending: Emergency Medicine | Admitting: Emergency Medicine

## 2024-05-12 DIAGNOSIS — Z7982 Long term (current) use of aspirin: Secondary | ICD-10-CM | POA: Insufficient documentation

## 2024-05-12 DIAGNOSIS — M5442 Lumbago with sciatica, left side: Secondary | ICD-10-CM | POA: Diagnosis not present

## 2024-05-12 DIAGNOSIS — M545 Low back pain, unspecified: Secondary | ICD-10-CM | POA: Diagnosis present

## 2024-05-12 DIAGNOSIS — M5432 Sciatica, left side: Secondary | ICD-10-CM

## 2024-05-12 MED ORDER — TRAMADOL HCL 50 MG PO TABS: 50.0000 mg | ORAL_TABLET | Freq: Four times a day (QID) | ORAL | 0 refills | Status: AC | PRN

## 2024-05-12 MED ORDER — METHYLPREDNISOLONE 4 MG PO TBPK: ORAL_TABLET | ORAL | 0 refills | Status: AC

## 2024-05-12 NOTE — ED Triage Notes (Signed)
 Pt presents with having occasional left flank pain but last night woke her from sleep. No urinary symptoms.  No bowel symptoms.  No n/v/d   Pt is alert and oriented.  Ambulatory to bed without difficulty.

## 2024-05-12 NOTE — ED Provider Notes (Signed)
 [place] EMERGENCY DEPARTMENT AT [facility] Provider Note   CSN: [id number] Arrival date & time: 05/12/24  1239     Patient presents with: Flank Pain   [name] is a 89 y.o. female.   HPI Patient is experiencing pain in her left lower back area.  Patient reports she thought it was her kidney but she indicates with her hand her SI joint on the left.  She describes the pain as going down the back of her leg to above the knee and also wrapping around to the groin.  No injury occurred.  She has otherwise been well.  No pain no burning or urgency of urination.  No fever no chills no abdominal pain.  Patient reports occasionally she has had low back pain but she has not had problems with recurrent chronic pain.  He does have some tramadol  for other pain that she tried.  She reports it was helpful but she still has discomfort.  Patient is ambulatory.    Prior to Admission medications   Medication Sig Start Date End Date Taking? Authorizing Provider  methylPREDNISolone  (MEDROL  DOSEPAK) 4 MG TBPK tablet Per Dosepak instructions. 05/12/24  Yes Armenta Canning, MD  traMADol  (ULTRAM ) 50 MG tablet Take 1 tablet (50 mg total) by mouth every 6 (six) hours as needed. 05/12/24  Yes Armenta Canning, MD  albuterol  (VENTOLIN  HFA) 108 (90 Base) MCG/ACT inhaler Inhale 2 puffs into the lungs every 6 (six) hours as needed for wheezing or shortness of breath. 11/26/23   Bedsole, Amy E, MD  aspirin  81 MG tablet Take 81 mg by mouth daily.    [provider]  colchicine  0.6 MG tablet 1 tab po x 1 then repeat in 2 hours, continue daily until flare resolved. 03/22/19   Bedsole, Amy E, MD  lisinopril -hydrochlorothiazide  (ZESTORETIC ) 20-12.5 MG tablet Take 2 tablets by mouth daily. 11/26/23   Avelina Greig BRAVO, MD  metoprolol  tartrate (LOPRESSOR ) 25 MG tablet Take 1 tablet (25 mg total) by mouth 2 (two) times daily. Take with 50 mg tablet for total of 75 mg. 11/26/23   Bedsole, Amy E, MD  metoprolol   tartrate (LOPRESSOR ) 50 MG tablet Take 1 tablet (50 mg total) by mouth 2 (two) times daily. Take with 25 mg tablet for total of 75 mg 11/26/23   Bedsole, Amy E, MD  Multiple Vitamin (MULTIVITAMIN) tablet Take 1 tablet by mouth daily.    [provider]  mupirocin  ointment (BACTROBAN ) 2 % Apply 1 application topically 3 (three) times daily. 10/31/21   Moye, Virginia , MD  neomycin-polymyxin b-dexamethasone  (MAXITROL) 3.5-10000-0.1 OINT  03/22/21   [provider]  omeprazole  (PRILOSEC) 40 MG capsule Take 1 capsule (40 mg total) by mouth daily. 11/26/23   Avelina Greig BRAVO, MD  OVER THE COUNTER MEDICATION I caps 1 tab bid    [provider]  polyethylene glycol (MIRALAX  / GLYCOLAX ) 17 g packet Take 17 g by mouth daily. 11/06/21   Charlyn Sora, MD  sertraline  (ZOLOFT ) 50 MG tablet Take 1 tablet (50 mg total) by mouth at bedtime. 11/26/23   Avelina Greig BRAVO, MD    Allergies: Patient has no known allergies.    Review of Systems  Updated Vital Signs BP (!) 175/105   Pulse 68   Temp (!) 97.4 F (36.3 C) (Oral)   Resp 18   SpO2 100%   Physical Exam Constitutional:      Comments: Alert nontoxic.  Mental status is clear.  No respiratory distress.  HENT:     Mouth/Throat:     Pharynx: Oropharynx is clear.  Eyes:     Extraocular Movements: Extraocular movements intact.  Cardiovascular:     Rate and Rhythm: Normal rate and regular rhythm.  Pulmonary:     Effort: Pulmonary effort is normal.     Breath sounds: Normal breath sounds.  Abdominal:     Comments: No reproducible abdominal pain.  Abdomen is soft without guarding.  Nontender palpation of the left groin.  No lymphadenopathy.  No mass no fullness.  Musculoskeletal:     Comments: No CVA tenderness.  Patient points out pain in the area of the left SI joint.  No palpable abnormality.  No rash no soft tissue changes.  Lower extremities are symmetric.  Calves are soft and pliable.  Patient has intact range of motion.  I am able  to both flex and extend the left lower extremity at the hip and the knee.  For age, patient has good flexibility and mobility.  Skin:    General: Skin is warm and dry.  Neurological:     General: No focal deficit present.     Mental Status: She is oriented to person, place, and time.     Motor: No weakness.     Coordination: Coordination normal.  Psychiatric:        Mood and Affect: Mood normal.     (all labs ordered are listed, but only abnormal results are displayed) Labs Reviewed - No data to display  EKG: None  Radiology: No results found.   Procedures   Medications Ordered in the ED - No data to display                                  Medical Decision Making  Presents as outlined.  Her presenting complaint was for flank and kidney pain however physical examination and indication does not suggest any renal involvement.  I have very low suspicion for kidney stone or pyelonephritis.  Patient clearly illustrates the SI joint.  Patient has good intact function without any neurologic deficit.  At this time appropriate for empiric treatment with steroid therapy and pain control.  Will prescribe Medrol  Dosepak and tramadol .  Patient got pain improvement at home with tramadol .  Patient's daughter is at bedside.  We discussed the follow-up plan and home care.     Final diagnoses:  Sciatica of left side    ED Discharge Orders          Ordered    methylPREDNISolone  (MEDROL  DOSEPAK) 4 MG TBPK tablet        05/12/24 1413    traMADol  (ULTRAM ) 50 MG tablet  Every 6 hours PRN        05/12/24 1413               Armenta Canning, MD 05/12/24 1414

## 2024-05-12 NOTE — Discharge Instructions (Addendum)
 1.  Your symptoms are consistent with sciatica.  Information about this is included in your discharge instructions.  2.  Schedule a follow-up appointment with your doctor for recheck within the next 3 to 7 days. 3.  Start your Medrol  Dosepak tomorrow morning.  May take 1-2 tramadol  every 6 hours as needed for pain.  You may also take a dose of extra strength Tylenol  with the tramadol  for additional pain control.

## 2024-05-23 ENCOUNTER — Ambulatory Visit
Admission: RE | Admit: 2024-05-23 | Discharge: 2024-05-23 | Disposition: A | Discharge status: Home / Self Care | Source: Ambulatory Visit | Attending: Family Medicine | Admitting: Family Medicine

## 2024-05-23 DIAGNOSIS — Z1231 Encounter for screening mammogram for malignant neoplasm of breast: Secondary | ICD-10-CM

## 2024-05-26 ENCOUNTER — Ambulatory Visit: Payer: Self-pay | Admitting: Family Medicine

## 2024-06-06 ENCOUNTER — Ambulatory Visit (INDEPENDENT_AMBULATORY_CARE_PROVIDER_SITE_OTHER): Payer: HMO | Admitting: Dermatology

## 2024-06-06 ENCOUNTER — Encounter: Payer: Self-pay | Admitting: Dermatology

## 2024-06-06 DIAGNOSIS — C44629 Squamous cell carcinoma of skin of left upper limb, including shoulder: Secondary | ICD-10-CM

## 2024-06-06 DIAGNOSIS — D1801 Hemangioma of skin and subcutaneous tissue: Secondary | ICD-10-CM | POA: Diagnosis not present

## 2024-06-06 DIAGNOSIS — L821 Other seborrheic keratosis: Secondary | ICD-10-CM | POA: Diagnosis not present

## 2024-06-06 DIAGNOSIS — Z86007 Personal history of in-situ neoplasm of skin: Secondary | ICD-10-CM

## 2024-06-06 DIAGNOSIS — Z872 Personal history of diseases of the skin and subcutaneous tissue: Secondary | ICD-10-CM

## 2024-06-06 DIAGNOSIS — D229 Melanocytic nevi, unspecified: Secondary | ICD-10-CM

## 2024-06-06 DIAGNOSIS — Z1283 Encounter for screening for malignant neoplasm of skin: Secondary | ICD-10-CM | POA: Diagnosis not present

## 2024-06-06 DIAGNOSIS — W908XXA Exposure to other nonionizing radiation, initial encounter: Secondary | ICD-10-CM

## 2024-06-06 DIAGNOSIS — D485 Neoplasm of uncertain behavior of skin: Secondary | ICD-10-CM

## 2024-06-06 DIAGNOSIS — L814 Other melanin hyperpigmentation: Secondary | ICD-10-CM

## 2024-06-06 DIAGNOSIS — L578 Other skin changes due to chronic exposure to nonionizing radiation: Secondary | ICD-10-CM | POA: Diagnosis not present

## 2024-06-06 DIAGNOSIS — C4492 Squamous cell carcinoma of skin, unspecified: Secondary | ICD-10-CM

## 2024-06-06 DIAGNOSIS — D492 Neoplasm of unspecified behavior of bone, soft tissue, and skin: Secondary | ICD-10-CM

## 2024-06-06 HISTORY — DX: Squamous cell carcinoma of skin, unspecified: C44.92

## 2024-06-06 NOTE — Patient Instructions (Addendum)

## 2024-06-06 NOTE — Progress Notes (Signed)
 Follow-Up Visit   Subjective  [name] is a 89 y.o. female who presents for the following: Skin Cancer Screening and Full Body Skin Exam hx of SCC IS, hx of Aks, check spot scalp she hits with comb  Patient accompanied by daughter.  The patient presents for Total-Body Skin Exam (TBSE) for skin cancer screening and mole check. The patient has spots, moles and lesions to be evaluated, some may be new or changing and the patient may have concern these could be cancer.    The following portions of the chart were reviewed this encounter and updated as appropriate: medications, allergies, medical history  Review of Systems:  No other skin or systemic complaints except as noted in HPI or Assessment and Plan.  Objective  Well appearing patient in no apparent distress; mood and affect are within normal limits.  A full examination was performed including scalp, head, eyes, ears, nose, lips, neck, chest, axillae, abdomen, back, buttocks, bilateral upper extremities, bilateral lower extremities, hands, feet, fingers, toes, fingernails, and toenails. All findings within normal limits unless otherwise noted below.   Relevant physical exam findings are noted in the Assessment and Plan.  L proximal forearm 7.61mm pink keratotic pap   Assessment & Plan   SKIN CANCER SCREENING PERFORMED TODAY.  ACTINIC DAMAGE - Chronic condition, secondary to cumulative UV/sun exposure - diffuse scaly erythematous macules with underlying dyspigmentation - Recommend daily broad spectrum sunscreen SPF 30+ to sun-exposed areas, reapply every 2 hours as needed.  - Staying in the shade or wearing long sleeves, sun glasses (UVA+UVB protection) and wide brim hats (4-inch brim around the entire circumference of the hat) are also recommended for sun protection.  - Call for new or changing lesions.  LENTIGINES, SEBORRHEIC KERATOSES, HEMANGIOMAS - Benign normal skin lesions - Benign-appearing - Call for any  changes  MELANOCYTIC NEVI - Tan-brown and/or pink-flesh-colored symmetric macules and papules - Benign appearing on exam today - Observation - Call clinic for new or changing moles - Recommend daily use of broad spectrum spf 30+ sunscreen to sun-exposed areas.   HISTORY OF SQUAMOUS CELL CARCINOMA IN SITU OF THE SKIN - improved. Eroded papule, suspect excoriation given appearance and given other excoriations on upper back - Recommend regular full body skin exams - Recommend daily broad spectrum sunscreen SPF 30+ to sun-exposed areas, reapply every 2 hours as needed.  - Call if any new or changing lesions are noted between office visits  - Vertex scalp, Mid vertex scalp  HISTORY OF PRECANCEROUS ACTINIC KERATOSIS - site(s) of PreCancerous Actinic Keratosis clear today. - these may recur and new lesions may form requiring treatment to prevent transformation into skin cancer - observe for new or changing spots and contact [facility] for appointment if occur - photoprotection with sun protective clothing; sunglasses and broad spectrum sunscreen with SPF of at least 30 + and frequent self skin exams recommended - yearly exams by a dermatologist recommended for persons with history of PreCancerous Actinic Keratoses   NEOPLASM OF SKIN L proximal forearm Skin / nail biopsy Type of biopsy: tangential   Informed consent: discussed and consent obtained   Timeout: patient name, date of birth, surgical site, and procedure verified   Procedure prep:  Patient was prepped and draped in usual sterile fashion Prep type:  Isopropyl alcohol  Anesthesia: the lesion was anesthetized in a standard fashion   Anesthetic:  1% lidocaine w/ epinephrine  1-100,000 buffered w/ 8.4% NaHCO3 Instrument used: DermaBlade   Hemostasis achieved with:  pressure and aluminum chloride   Outcome: patient tolerated procedure well   Post-procedure details: sterile dressing applied and wound care instructions given    Dressing type: bandage and petrolatum    Specimen 1 - Surgical pathology Differential Diagnosis: R/O SCC  Check Margins: No 7.76mm pink keratotic pap MULTIPLE BENIGN NEVI   LENTIGINES   ACTINIC ELASTOSIS   SEBORRHEIC KERATOSES   CHERRY ANGIOMA   Return in about 6 months (around 12/07/2024) for TBSE, Hx of SCC IS, Hx of AKs.  I, Grayce Saunas, RMA, am acting as scribe for Boneta Sharps, MD .   Documentation: I have reviewed the above documentation for accuracy and completeness, and I agree with the above.  Boneta Sharps, MD

## 2024-06-10 ENCOUNTER — Ambulatory Visit: Payer: Self-pay | Admitting: Dermatology

## 2024-06-10 LAB — SURGICAL PATHOLOGY

## 2024-06-13 ENCOUNTER — Encounter: Payer: Self-pay | Admitting: Dermatology

## 2024-06-13 NOTE — Telephone Encounter (Signed)
 Patient's daughter advised path shows SCC, scheduled for [facility]. Lonell RAMAN., RMA

## 2024-06-13 NOTE — Telephone Encounter (Signed)
-----   Message from [place] sent at 06/10/2024  5:19 PM EDT ----- Diagnosis: L proximal forearm :       WELL DIFFERENTIATED SQUAMOUS CELL CARCINOMA, 0.9MM   Please call  Explanation: Biopsy shows a squamous cell skin cancer that has grown beyond the surface of the skin and is invading the second layer of the skin. It has the potential to spread beyond the skin and  threaten your health, so I recommend treating it.  Treatment option 1: you return for a 15 minute appointment where we perform electrodesiccation and curettage [facility]). This involves three rounds of scraping and burning to destroy the skin cancer. It  has about an 75-85% cure rate and leaves a round wound slightly larger than the skin cancer and leaves a round white scar. No additional pathology is done. If the skin cancer comes back, we would  need to do a surgery to remove it.  ----- Message ----- From: Interface, Lab In Three [facility] One Sent: 06/10/2024   9:17 AM EDT To: Boneta Sharps, MD

## 2024-06-23 ENCOUNTER — Ambulatory Visit: Admitting: Dermatology

## 2024-07-05 ENCOUNTER — Ambulatory Visit: Admitting: Dermatology

## 2024-07-05 ENCOUNTER — Encounter: Payer: Self-pay | Admitting: Dermatology

## 2024-07-05 DIAGNOSIS — S51802A Unspecified open wound of left forearm, initial encounter: Secondary | ICD-10-CM

## 2024-07-05 DIAGNOSIS — C4492 Squamous cell carcinoma of skin, unspecified: Secondary | ICD-10-CM

## 2024-07-05 DIAGNOSIS — C44629 Squamous cell carcinoma of skin of left upper limb, including shoulder: Secondary | ICD-10-CM | POA: Diagnosis not present

## 2024-07-05 DIAGNOSIS — S51801A Unspecified open wound of right forearm, initial encounter: Secondary | ICD-10-CM

## 2024-07-05 DIAGNOSIS — T148XXA Other injury of unspecified body region, initial encounter: Secondary | ICD-10-CM

## 2024-07-05 NOTE — Progress Notes (Signed)
   Follow-Up Visit   Subjective  [name] is a 89 y.o. female who presents for the following: EDC for bx proven SCC at left proximal forearm   The following portions of the chart were reviewed this encounter and updated as appropriate: medications, allergies, medical history  Review of Systems:  No other skin or systemic complaints except as noted in HPI or Assessment and Plan.  Objective  Well appearing patient in no apparent distress; mood and affect are within normal limits.   A focused examination was performed of the following areas: Left arm  Relevant exam findings are noted in the Assessment and Plan.  left proximal forearm Pink bx site  Assessment & Plan     SQUAMOUS CELL CARCINOMA OF SKIN left proximal forearm Destruction of lesion Complexity: simple   Destruction method: electrodesiccation and curettage   Informed consent: discussed and consent obtained   Timeout:  patient name, date of birth, surgical site, and procedure verified Procedure prep:  Patient was prepped and draped in usual sterile fashion Prep type:  Isopropyl alcohol  Anesthesia: the lesion was anesthetized in a standard fashion   Anesthetic:  1% lidocaine w/ epinephrine  1-100,000 local infiltration Curettage performed in three different directions: Yes   Electrodesiccation performed over the curetted area: Yes   Curettage cycles:  3 Final wound size (cm):  1.1 Hemostasis achieved with:  aluminum chloride and electrodesiccation Outcome: patient tolerated procedure well with no complications   Post-procedure details: sterile dressing applied and wound care instructions given   Dressing type: bandage and petrolatum    OPEN WOUND (2) Left Forearm, Right Forearm 2ndary to trauma  Wounds cleaned with Puracyn, mupirocin  applied and covered with bandage.   Recommend cleaning with 1 TBSP vinegar in 1 cup warm water a few times daily. Pat dry and cover with Vaseline or Aquaphor before  bandaging with non-stick gauze and paper tape.   Return for TBSE, as scheduled, with Dr. Claudene, [facility].  LILLETTE Lonell Drones, RMA, am acting as scribe for Boneta Claudene, MD .   Documentation: I have reviewed the above documentation for accuracy and completeness, and I agree with the above.  Boneta Claudene, MD

## 2024-07-05 NOTE — Patient Instructions (Addendum)
 For wounds at arms - Recommend cleaning with 1 TBSP vinegar in 1 cup warm water a few times daily. Pat dry and cover with Vaseline or Aquaphor before bandaging with non-stick gauze and either wrap or paper tape.   Wound Care Instructions  Cleanse wound gently with soap and water once a day then pat dry with clean gauze. Apply a thin coat of Petrolatum (petroleum jelly, Vaseline) over the wound (unless you have an allergy to this). We recommend that you use a new, sterile tube of Vaseline. Do not pick or remove scabs. Do not remove the yellow or white healing tissue from the base of the wound.  Cover the wound with fresh, clean, nonstick gauze and secure with paper tape. You may use Band-Aids in place of gauze and tape if the wound is small enough, but would recommend trimming much of the tape off as there is often too much. Sometimes Band-Aids can irritate the skin.  You should call the office for your biopsy report after 1 week if you have not already been contacted.  If you experience any problems, such as abnormal amounts of bleeding, swelling, significant bruising, significant pain, or evidence of infection, please call the office immediately.  FOR ADULT SURGERY PATIENTS: If you need something for pain relief you may take 1 extra strength Tylenol  (acetaminophen ) AND 2 Ibuprofen (200mg  each) together every 4 hours as needed for pain. (do not take these if you are allergic to them or if you have a reason you should not take them.) Typically, you may only need pain medication for 1 to 3 days.   Due to recent changes in healthcare laws, you may see results of your pathology and/or laboratory studies on MyChart before the doctors have had a chance to review them. We understand that in some cases there may be results that are confusing or concerning to you. Please understand that not all results are received at the same time and often the doctors may need to interpret multiple results in order to  provide you with the best plan of care or course of treatment. Therefore, we ask that you please give us  2 business days to thoroughly review all your results before contacting the office for clarification. Should we see a critical lab result, you will be contacted sooner.   If You Need Anything After Your Visit  If you have any questions or concerns for your doctor, please call our main line at [phone] and press option 4 to reach your doctor's medical assistant. If no one answers, please leave a voicemail as directed and we will return your call as soon as possible. Messages left after 4 pm will be answered the following business day.   You may also send us  a message via MyChart. We typically respond to MyChart messages within 1-2 business days.  For prescription refills, please ask your pharmacy to contact our office. Our fax number is [phone].  If you have an urgent issue when the clinic is closed that cannot wait until the next business day, you can page your doctor at the number below.    Please note that while we do our best to be available for urgent issues outside of office hours, we are not available 24/7.   If you have an urgent issue and are unable to reach us , you may choose to seek medical care at your doctor's office, retail clinic, urgent care center, or emergency room.  If you have a medical emergency, please immediately  call 911 or go to the emergency department.  Pager Numbers  - Dr. Hester: [phone]  - Dr. Jackquline: [phone]  - Dr. Claudene: [phone]   - Dr. Raymund: [phone]  In the event of inclement weather, please call our main line at [phone] for an update on the status of any delays or closures.  Dermatology Medication Tips: Please keep the boxes that topical medications come in in order to help keep track of the instructions about where and how to use these. Pharmacies typically print the medication instructions only on the boxes  and not directly on the medication tubes.   If your medication is too expensive, please contact our office at [phone] option 4 or send us  a message through MyChart.   We are unable to tell what your co-pay for medications will be in advance as this is different depending on your insurance coverage. However, we may be able to find a substitute medication at lower cost or fill out paperwork to get insurance to cover a needed medication.   If a prior authorization is required to get your medication covered by your insurance company, please allow us  1-2 business days to complete this process.  Drug prices often vary depending on where the prescription is filled and some pharmacies may offer cheaper prices.  The website www.goodrx.com contains coupons for medications through different pharmacies. The prices here do not account for what the cost may be with help from insurance (it may be cheaper with your insurance), but the website can give you the price if you did not use any insurance.  - You can print the associated coupon and take it with your prescription to the pharmacy.  - You may also stop by our office during regular business hours and pick up a GoodRx coupon card.  - If you need your prescription sent electronically to a different pharmacy, notify our office through [facility] or by phone at [phone] option 4.     Si Usted Necesita Algo Despus de Su Visita  Tambin puede enviarnos un mensaje a travs de Clinical cytogeneticist. Por lo general respondemos a los mensajes de MyChart en el transcurso de 1 a 2 das hbiles.  Para renovar recetas, por favor pida a su farmacia que se ponga en contacto con nuestra oficina. Randi lakes de fax es [place] [phone].  Si tiene un asunto urgente cuando la clnica est cerrada y que no puede esperar hasta el siguiente da hbil, puede llamar/localizar a su doctor(a) al nmero que aparece a continuacin.   Por favor, tenga en cuenta que aunque  hacemos todo lo posible para estar disponibles para asuntos urgentes fuera del horario de [place], no estamos disponibles las 24 horas del da, los 7 [address]  [address] de la [place].   Si tiene un problema urgente y no puede comunicarse con nosotros, puede optar por buscar atencin mdica  en el consultorio de su doctor(a), en una clnica privada, en un centro de atencin urgente o en una sala de emergencias.  Si tiene Engineer, drilling, por favor llame inmediatamente al 911 o vaya a la sala de emergencias.  Nmeros de bper  - Dr. Hester: [phone]  - Dra. Jackquline: [phone]  - Dr. Claudene: [phone]  - Dra. Kitts: [phone]  En caso de inclemencias del [place], por favor llame a nuestra lnea principal al [phone] para una actualizacin sobre el estado de cualquier retraso o cierre.  Consejos para la medicacin en dermatologa: Por favor, guarde las cajas en las que vienen  los medicamentos de uso tpico para ayudarle a seguir las instrucciones sobre dnde y cmo usarlos. Las farmacias generalmente imprimen las instrucciones del medicamento slo en las cajas y no directamente en los tubos del [place].   Si su medicamento es muy caro, por favor, pngase en contacto con landry rieger llamando al [phone] y presione la opcin 4 o envenos un mensaje a travs de Clinical cytogeneticist.   No podemos decirle cul ser su copago por los medicamentos por adelantado ya que esto es diferente dependiendo de la cobertura de su seguro. Sin embargo, es posible que podamos encontrar un medicamento sustituto a Audiological scientist un formulario para que el seguro cubra el medicamento que se considera necesario.   Si se requiere una autorizacin previa para que su compaa de seguros malta su medicamento, por favor permtanos de 1 a 2 das hbiles para completar este proceso.  Los precios de los medicamentos varan con frecuencia dependiendo del Environmental consultant de dnde se surte la receta y alguna farmacias pueden  ofrecer precios ms baratos.  El sitio web www.goodrx.com tiene cupones para medicamentos de Health and safety inspector. Los precios aqu no tienen en cuenta lo que podra costar con la ayuda del seguro (puede ser ms barato con su seguro), pero el sitio web puede darle el precio si no utiliz Tourist information centre manager.  - Puede imprimir el cupn correspondiente y llevarlo con su receta a la farmacia.  - Tambin puede pasar por nuestra oficina durante el horario de atencin regular y Education officer, museum una tarjeta de cupones de GoodRx.  - Si necesita que su receta se enve electrnicamente a una farmacia diferente, informe a nuestra oficina a travs de MyChart de [place] o por telfono llamando al [phone] y presione la opcin 4.

## 2024-07-16 ENCOUNTER — Other Ambulatory Visit: Payer: Self-pay | Admitting: Family Medicine

## 2024-07-16 ENCOUNTER — Other Ambulatory Visit (HOSPITAL_COMMUNITY): Payer: Self-pay

## 2024-07-16 DIAGNOSIS — F320 Major depressive disorder, single episode, mild: Secondary | ICD-10-CM

## 2024-07-16 DIAGNOSIS — K2210 Ulcer of esophagus without bleeding: Secondary | ICD-10-CM

## 2024-07-16 DIAGNOSIS — I10 Essential (primary) hypertension: Secondary | ICD-10-CM

## 2024-07-18 ENCOUNTER — Other Ambulatory Visit (HOSPITAL_COMMUNITY): Payer: Self-pay

## 2024-07-18 ENCOUNTER — Other Ambulatory Visit: Payer: Self-pay

## 2024-07-18 MED ORDER — METOPROLOL TARTRATE 25 MG PO TABS
25.0000 mg | ORAL_TABLET | Freq: Two times a day (BID) | ORAL | 2 refills | Status: AC
  Filled 2024-07-18: qty 180, 90d supply, fill #0
  Filled 2024-09-30: qty 180, 90d supply, fill #1

## 2024-07-18 MED ORDER — OMEPRAZOLE 40 MG PO CPDR
40.0000 mg | DELAYED_RELEASE_CAPSULE | Freq: Every day | ORAL | 2 refills | Status: AC
  Filled 2024-07-18: qty 90, 90d supply, fill #0
  Filled 2024-09-30: qty 90, 90d supply, fill #1

## 2024-07-18 MED ORDER — SERTRALINE HCL 50 MG PO TABS
50.0000 mg | ORAL_TABLET | Freq: Every day | ORAL | 2 refills | Status: AC
  Filled 2024-07-18: qty 90, 90d supply, fill #0
  Filled 2024-09-30: qty 90, 90d supply, fill #1

## 2024-07-18 MED ORDER — METOPROLOL TARTRATE 50 MG PO TABS
50.0000 mg | ORAL_TABLET | Freq: Two times a day (BID) | ORAL | 2 refills | Status: AC
  Filled 2024-07-18: qty 180, 90d supply, fill #0
  Filled 2024-09-30: qty 180, 90d supply, fill #1

## 2024-07-18 MED ORDER — LISINOPRIL-HYDROCHLOROTHIAZIDE 20-12.5 MG PO TABS
2.0000 | ORAL_TABLET | Freq: Every day | ORAL | 2 refills | Status: AC
  Filled 2024-07-18: qty 180, 90d supply, fill #0
  Filled 2024-09-30: qty 180, 90d supply, fill #1

## 2024-09-30 ENCOUNTER — Other Ambulatory Visit: Payer: Self-pay

## 2024-12-12 ENCOUNTER — Ambulatory Visit: Admitting: Dermatology

## 2025-03-03 ENCOUNTER — Ambulatory Visit
